# Patient Record
Sex: Female | Born: 1990 | Race: Black or African American | Hispanic: No | Marital: Single | Smoking: Former smoker
Health system: Southern US, Community
[De-identification: ages and names within clinical notes are randomized; demographics above are authoritative.]

## PROBLEM LIST (undated history)

## (undated) ENCOUNTER — Inpatient Hospital Stay (HOSPITAL_COMMUNITY): Payer: Self-pay

## (undated) DIAGNOSIS — D573 Sickle-cell trait: Secondary | ICD-10-CM

## (undated) DIAGNOSIS — K439 Ventral hernia without obstruction or gangrene: Secondary | ICD-10-CM

## (undated) DIAGNOSIS — I4891 Unspecified atrial fibrillation: Secondary | ICD-10-CM

## (undated) DIAGNOSIS — F1290 Cannabis use, unspecified, uncomplicated: Secondary | ICD-10-CM

## (undated) DIAGNOSIS — F1490 Cocaine use, unspecified, uncomplicated: Secondary | ICD-10-CM

## (undated) DIAGNOSIS — A749 Chlamydial infection, unspecified: Secondary | ICD-10-CM

## (undated) DIAGNOSIS — O24419 Gestational diabetes mellitus in pregnancy, unspecified control: Secondary | ICD-10-CM

## (undated) DIAGNOSIS — N390 Urinary tract infection, site not specified: Secondary | ICD-10-CM

## (undated) HISTORY — DX: Cocaine use, unspecified, uncomplicated: F14.90

## (undated) HISTORY — DX: Cannabis use, unspecified, uncomplicated: F12.90

## (undated) HISTORY — DX: Gestational diabetes mellitus in pregnancy, unspecified control: O24.419

## (undated) HISTORY — PX: WISDOM TOOTH EXTRACTION: SHX21

---
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
## 2011-02-11 LAB — DIFFERENTIAL
Eosinophils Absolute: 0.2 10*3/uL (ref 0.0–1.2)
Lymphs Abs: 1.3 10*3/uL (ref 1.1–4.8)
Monocytes Relative: 11 % (ref 3–11)
Neutrophils Relative %: 60 % (ref 43–71)

## 2011-02-11 LAB — APTT: aPTT: 28 seconds (ref 24–37)

## 2011-02-11 LAB — URINE MICROSCOPIC-ADD ON

## 2011-02-11 LAB — URINALYSIS, ROUTINE W REFLEX MICROSCOPIC
Ketones, ur: NEGATIVE mg/dL
Nitrite: NEGATIVE
Specific Gravity, Urine: 1.016 (ref 1.005–1.030)
pH: 6.5 (ref 5.0–8.0)

## 2011-02-11 LAB — CBC
HCT: 38.7 % (ref 36.0–49.0)
Hemoglobin: 13.2 g/dL (ref 12.0–16.0)
MCH: 28.1 pg (ref 25.0–34.0)
RBC: 4.7 MIL/uL (ref 3.80–5.70)

## 2011-02-11 LAB — PROTIME-INR: INR: 1.12 (ref 0.00–1.49)

## 2011-02-11 LAB — GC/CHLAMYDIA PROBE AMP, URINE: Chlamydia, Swab/Urine, PCR: POSITIVE — AB

## 2011-03-05 LAB — RAPID STREP SCREEN (MED CTR MEBANE ONLY): Streptococcus, Group A Screen (Direct): NEGATIVE

## 2011-03-08 LAB — RAPID STREP SCREEN (MED CTR MEBANE ONLY): Streptococcus, Group A Screen (Direct): NEGATIVE

## 2011-03-09 LAB — BASIC METABOLIC PANEL
CO2: 27 mEq/L (ref 19–32)
Chloride: 105 mEq/L (ref 96–112)
Sodium: 136 mEq/L (ref 135–145)

## 2011-03-09 LAB — URINALYSIS, ROUTINE W REFLEX MICROSCOPIC
Bilirubin Urine: NEGATIVE
Glucose, UA: NEGATIVE mg/dL
Hgb urine dipstick: NEGATIVE
Ketones, ur: NEGATIVE mg/dL
Nitrite: POSITIVE — AB
Protein, ur: NEGATIVE mg/dL
Specific Gravity, Urine: 1.013 (ref 1.005–1.030)
Urobilinogen, UA: 1 mg/dL (ref 0.0–1.0)
pH: 7.5 (ref 5.0–8.0)

## 2011-03-09 LAB — CBC
Hemoglobin: 12.8 g/dL (ref 12.0–16.0)
MCHC: 33.8 g/dL (ref 31.0–37.0)
MCV: 85.8 fL (ref 78.0–98.0)
RBC: 4.42 MIL/uL (ref 3.80–5.70)

## 2011-03-09 LAB — DIFFERENTIAL
Basophils Relative: 1 % (ref 0–1)
Eosinophils Absolute: 0.1 10*3/uL (ref 0.0–1.2)
Monocytes Absolute: 0.4 10*3/uL (ref 0.2–1.2)
Monocytes Relative: 9 % (ref 3–11)
Neutro Abs: 2.8 10*3/uL (ref 1.7–8.0)

## 2011-05-02 ENCOUNTER — Emergency Department (HOSPITAL_COMMUNITY)
Admission: EM | Admit: 2011-05-02 | Discharge: 2011-05-03 | Discharge status: Against Medical Advice | Payer: Medicaid Other | Attending: Emergency Medicine | Admitting: Emergency Medicine

## 2011-05-02 DIAGNOSIS — R079 Chest pain, unspecified: Secondary | ICD-10-CM | POA: Insufficient documentation

## 2011-08-23 LAB — URINE MICROSCOPIC-ADD ON

## 2011-08-23 LAB — POCT I-STAT, CHEM 8
Calcium, Ion: 1.3
Glucose, Bld: 99
HCT: 42
Hemoglobin: 14.3

## 2011-08-23 LAB — URINALYSIS, ROUTINE W REFLEX MICROSCOPIC
Glucose, UA: NEGATIVE
pH: 6

## 2011-08-23 LAB — CBC
HCT: 37.3
Hemoglobin: 12.5
MCHC: 32.9
MCV: 85.9
Platelets: 225
RDW: 13.4
RDW: 13.4

## 2011-08-23 LAB — URINE CULTURE

## 2011-12-02 ENCOUNTER — Emergency Department (HOSPITAL_COMMUNITY)
Admission: EM | Admit: 2011-12-02 | Discharge: 2011-12-02 | Discharge status: Against Medical Advice | Payer: Medicaid Other | Attending: Emergency Medicine | Admitting: Emergency Medicine

## 2011-12-02 DIAGNOSIS — Z0389 Encounter for observation for other suspected diseases and conditions ruled out: Secondary | ICD-10-CM | POA: Insufficient documentation

## 2011-12-02 HISTORY — DX: Unspecified atrial fibrillation: I48.91

## 2011-12-02 NOTE — ED Notes (Signed)
Pt presents with c/o racing heart x 1 day.  +shortness of breath and weakness.  Pt seen at PCP in [place] and sent here for admission.

## 2012-05-30 ENCOUNTER — Encounter (HOSPITAL_COMMUNITY): Payer: Self-pay | Admitting: *Deleted

## 2012-05-30 ENCOUNTER — Emergency Department (HOSPITAL_COMMUNITY)
Admission: EM | Admit: 2012-05-30 | Discharge: 2012-05-31 | Disposition: A | Discharge status: Home / Self Care | Payer: Medicaid Other | Attending: Emergency Medicine | Admitting: Emergency Medicine

## 2012-05-30 DIAGNOSIS — R071 Chest pain on breathing: Secondary | ICD-10-CM | POA: Insufficient documentation

## 2012-05-30 DIAGNOSIS — F17200 Nicotine dependence, unspecified, uncomplicated: Secondary | ICD-10-CM | POA: Insufficient documentation

## 2012-05-30 DIAGNOSIS — I4891 Unspecified atrial fibrillation: Secondary | ICD-10-CM | POA: Insufficient documentation

## 2012-05-30 DIAGNOSIS — R0789 Other chest pain: Secondary | ICD-10-CM

## 2012-05-30 LAB — CBC WITH DIFFERENTIAL/PLATELET
Basophils Relative: 1 % (ref 0–1)
Hemoglobin: 12.5 g/dL (ref 12.0–15.0)
MCHC: 34.3 g/dL (ref 30.0–36.0)
Monocytes Relative: 10 % (ref 3–12)
Neutro Abs: 4.2 10*3/uL (ref 1.7–7.7)
Neutrophils Relative %: 62 % (ref 43–77)
Platelets: 216 10*3/uL (ref 150–400)
RBC: 4.31 MIL/uL (ref 3.87–5.11)

## 2012-05-30 LAB — POCT I-STAT, CHEM 8
Chloride: 105 mEq/L (ref 96–112)
Glucose, Bld: 94 mg/dL (ref 70–99)
HCT: 39 % (ref 36.0–46.0)
Potassium: 3.7 mEq/L (ref 3.5–5.1)
Sodium: 142 mEq/L (ref 135–145)

## 2012-05-30 NOTE — ED Notes (Addendum)
C/o chest pain, describes as intermittant, pounding & burning, onset 2 weeks ago, also woke this morning with head swelling states "it (head) hurts". When asked also admits to sob, weakness and dizziness. Alert, NAD, calm interactive, skin W&D, resps e/u, speaking in clear complete sentences. "Chest and head rated 8/10, but head more bothersome". Mentions new prescription eye glasses.

## 2012-05-31 ENCOUNTER — Emergency Department (HOSPITAL_COMMUNITY): Payer: Medicaid Other

## 2012-05-31 MED ORDER — IBUPROFEN 200 MG PO TABS
400.0000 mg | ORAL_TABLET | Freq: Once | ORAL | Status: AC
  Administered 2012-05-31: 400 mg via ORAL
  Filled 2012-05-31: qty 2

## 2012-05-31 NOTE — ED Notes (Signed)
Pt ambulated with a steady gait;VSS; A&Ox3; no signs of distress; respirations even and unlabored; skin warm and dry; no questions at this time.  

## 2012-05-31 NOTE — ED Notes (Signed)
Patient transported to X-ray 

## 2012-05-31 NOTE — ED Provider Notes (Addendum)
History     CSN: [id number]  Arrival date & time 05/30/12  2044   First MD Initiated Contact with Patient 05/31/12 0019      Chief Complaint  Patient presents with  . Chest Pain  . Headache    (Consider location/radiation/quality/duration/timing/severity/associated sxs/prior treatment) HPI Complains of chest pain at substernal area nonradiating, pleuritic in nature onset 2 weeks ago lasting anywhere from one to 10 minutes at a time. No associated shortness of breath nausea or fever no cough. Also complains of frontal headache onset 2 days ago since she fell out of bed and banged her fore head on a coffee table no other complaint. No treatment prior to coming here. No associated symptoms. No generalized weakness Past Medical History  Diagnosis Date  . Atrial fibrillation     History reviewed. No pertinent past surgical history.  No family history on file.  History  Substance Use Topics  . Smoking status: Current Everyday Smoker  . Smokeless tobacco: Not on file  . Alcohol Use: Yes    OB History    Grav Para Term Preterm Abortions TAB SAB Ect Mult Living                  Review of Systems  Constitutional: Negative.   Respiratory: Negative.   Cardiovascular: Positive for chest pain.  Gastrointestinal: Negative.   Musculoskeletal: Negative.   Skin: Negative.   Neurological: Positive for headaches.  Hematological: Negative.   Psychiatric/Behavioral: Negative.   All other systems reviewed and are negative.    Allergies  Review of patient's allergies indicates no known allergies.  Home Medications  No current outpatient prescriptions on file.  BP 108/67  Pulse 59  Temp 98.4 F (36.9 C) (Oral)  Resp 20  SpO2 99%  LMP 05/23/2012  Physical Exam  Nursing note and vitals reviewed. Constitutional: She is oriented to person, place, and time. She appears well-developed and well-nourished.  HENT:  Head: Normocephalic and atraumatic.  Right Ear: External ear  normal.  Left Ear: External ear normal.  Eyes: Conjunctivae are normal. Pupils are equal, round, and reactive to light.  Neck: Neck supple. No tracheal deviation present. No thyromegaly present.  Cardiovascular: Normal rate and regular rhythm.   No murmur heard. Pulmonary/Chest: Effort normal and breath sounds normal.       Chest wall is tender over sternum reproducing pain exactly. Pain is also reproduced by forcible abduction of either shoulder  Abdominal: Soft. Bowel sounds are normal. She exhibits no distension. There is no tenderness.       Obese  Musculoskeletal: Normal range of motion. She exhibits no edema and no tenderness.  Neurological: She is alert and oriented to person, place, and time. No cranial nerve deficit. Coordination normal.       Her strength 5 over 5 overall  Skin: Skin is warm and dry. No rash noted.  Psychiatric: She has a normal mood and affect.    ED Course  Procedures (including critical care time) Labs Reviewed  CBC WITH DIFFERENTIAL  POCT I-STAT, CHEM 8   Date: 05/31/2012  Rate: 70  Rhythm: normal sinus rhythm  QRS Axis: normal  Intervals: normal  ST/T Wave abnormalities: normal  Conduction Disutrbances:none  Narrative Interpretation:   Old EKG Reviewed: Tracing from 04/07/2009 shows sinus bradycardia 55 beats per minute otherwise unchanged, interpreted by me  No results found. 2 AM patient is asymptomatic after treatment with ibuprofen  No diagnosis found.  Results for orders placed during the hospital encounter  of 05/30/12  CBC WITH DIFFERENTIAL      Component Value Range   WBC 6.8  4.0 - 10.5 K/uL   RBC 4.31  3.87 - 5.11 MIL/uL   Hemoglobin 12.5  12.0 - 15.0 g/dL   HCT 16.1  09.6 - 04.5 %   MCV 84.5  78.0 - 100.0 fL   MCH 29.0  26.0 - 34.0 pg   MCHC 34.3  30.0 - 36.0 g/dL   RDW 40.9  81.1 - 91.4 %   Platelets 216  150 - 400 K/uL   Neutrophils Relative 62  43 - 77 %   Neutro Abs 4.2  1.7 - 7.7 K/uL   Lymphocytes Relative 25  12 - 46  %   Lymphs Abs 1.7  0.7 - 4.0 K/uL   Monocytes Relative 10  3 - 12 %   Monocytes Absolute 0.7  0.1 - 1.0 K/uL   Eosinophils Relative 3  0 - 5 %   Eosinophils Absolute 0.2  0.0 - 0.7 K/uL   Basophils Relative 1  0 - 1 %   Basophils Absolute 0.1  0.0 - 0.1 K/uL  POCT I-STAT, CHEM 8      Component Value Range   Sodium 142  135 - 145 mEq/L   Potassium 3.7  3.5 - 5.1 mEq/L   Chloride 105  96 - 112 mEq/L   BUN 8  6 - 23 mg/dL   Creatinine, Ser 7.82  0.50 - 1.10 mg/dL   Glucose, Bld 94  70 - 99 mg/dL   Calcium, Ion 9.56  2.13 - 1.32 mmol/L   TCO2 25  0 - 100 mmol/L   Hemoglobin 13.3  12.0 - 15.0 g/dL   HCT 08.6  57.8 - 46.9 %   Dg Chest 2 View  05/31/2012  *RADIOLOGY REPORT*  Clinical Data: Chest pain.  Headache.  CHEST - 2 VIEW  Comparison: 10/29/2010  Findings: Heart and mediastinal contours are within normal limits. No focal opacities or effusions.  No acute bony abnormality.  IMPRESSION: No active cardiopulmonary disease.  Original Report Authenticated By: Cyndie Chime, M.D.    Chest x-ray reviewed by me MDM  Note patient denies atrial fibrillation history or any history of irregular heartbeat Chest Pain is felt to be musculoskeletal in etiology, headache nonspecific Smoking cessation encouraged spent 3 minutes counseling on smoking Referral resource guide Diagnosis #1 minor head trauma with post concussive headache #2 chest wall pain #3 tobacco abuse     Doug Sou, MD 05/31/12 0205  Doug Sou, MD 05/31/12 [id number]

## 2012-05-31 NOTE — ED Notes (Signed)
Pt reports chest pain worse upon lying down. No signs of distress; reports she was sleeping last night and rolled on to table; area on head is not swollen or bruised.

## 2014-08-30 ENCOUNTER — Encounter (HOSPITAL_COMMUNITY): Payer: Self-pay | Admitting: Emergency Medicine

## 2014-08-30 ENCOUNTER — Emergency Department (HOSPITAL_COMMUNITY)
Admission: EM | Admit: 2014-08-30 | Discharge: 2014-08-30 | Disposition: A | Discharge status: Home / Self Care | Payer: Medicaid Other | Attending: Emergency Medicine | Admitting: Emergency Medicine

## 2014-08-30 ENCOUNTER — Emergency Department (HOSPITAL_COMMUNITY): Payer: Medicaid Other

## 2014-08-30 DIAGNOSIS — Z8679 Personal history of other diseases of the circulatory system: Secondary | ICD-10-CM | POA: Insufficient documentation

## 2014-08-30 DIAGNOSIS — Z720 Tobacco use: Secondary | ICD-10-CM | POA: Insufficient documentation

## 2014-08-30 DIAGNOSIS — M25512 Pain in left shoulder: Secondary | ICD-10-CM

## 2014-08-30 MED ORDER — OXYCODONE-ACETAMINOPHEN 5-325 MG PO TABS
1.0000 | ORAL_TABLET | Freq: Once | ORAL | Status: AC
  Administered 2014-08-30: 1 via ORAL
  Filled 2014-08-30: qty 1

## 2014-08-30 MED ORDER — NAPROXEN 500 MG PO TABS: 500.0000 mg | ORAL_TABLET | Freq: Two times a day (BID) | ORAL | Status: DC

## 2014-08-30 NOTE — ED Notes (Signed)
Pt reports left shoulder pain x 2 days. Denies injury. Pain worse with palpation and movement. NAD.

## 2014-08-30 NOTE — Discharge Instructions (Signed)

## 2014-08-30 NOTE — ED Provider Notes (Signed)
CSN: [id number]     Arrival date & time 08/30/14  1654 History  This chart was scribed for non-physician practitioner, Etta Quill, FNP,working with Babette Relic, MD, by Marlowe Kays, ED Scribe. This patient was seen in room TR05C/TR05C and the patient's care was started at 5:47 PM.  Chief Complaint  Patient presents with  . Shoulder Pain   Patient is a 24 y.o. female presenting with shoulder pain. The history is provided by the patient. No language interpreter was used.  Shoulder Pain This is a new problem. The current episode started 2 days ago. The problem occurs constantly. The problem has not changed since onset.The symptoms are aggravated by exertion. Nothing relieves the symptoms. She has tried nothing for the symptoms.   HPI Comments:  Nichole Bowers is a 23 y.o. female with PMH of atrial fibrillation who presents to the Emergency Department complaining of sudden onset, worsening left shoulder pain and swelling onset two days upon waking. Pt states the pain is worse with movement and touch. She denies any alleviating factors. She denies any trauma, fall or injury. Denies numbness, tingling, weakness, redness or warmth of the area. She does not have a PCP.  Past Medical History  Diagnosis Date  . Atrial fibrillation    No past surgical history on file. No family history on file. History  Substance Use Topics  . Smoking status: Current Some Day Smoker    Types: Cigarettes  . Smokeless tobacco: Not on file  . Alcohol Use: Yes   OB History   Grav Para Term Preterm Abortions TAB SAB Ect Mult Living                 Review of Systems  Musculoskeletal: Positive for myalgias.  Skin: Negative for color change.  Neurological: Negative for weakness and numbness.  All other systems reviewed and are negative.   Allergies  Review of patient's allergies indicates no known allergies.  Home Medications   Prior to Admission medications   Not on File   Triage Vitals: BP  133/104  Pulse 78  Temp(Src) 97.8 F (36.6 C) (Oral)  Resp 18  SpO2 98%  LMP 07/31/2014 Physical Exam  Nursing note and vitals reviewed. Constitutional: She is oriented to person, place, and time. She appears well-developed and well-nourished.  HENT:  Head: Normocephalic and atraumatic.  Eyes: EOM are normal.  Neck: Normal range of motion.  Cardiovascular: Normal rate.   Radial pulses intact.  Pulmonary/Chest: Effort normal.  Musculoskeletal: Normal range of motion. She exhibits tenderness.  Tenderness diffusely over joint capsule. Increased pain of passive and active ROM.  Neurological: She is alert and oriented to person, place, and time.  Good grip strength. Distal sensations intact.  Skin: Skin is warm and dry.  Psychiatric: She has a normal mood and affect. Her behavior is normal.    ED Course  Procedures (including critical care time) DIAGNOSTIC STUDIES: Oxygen Saturation is 98% on RA, normal by my interpretation.   COORDINATION OF CARE: 5:51 PM- Will X-Ray left shoulder. Pt verbalizes understanding and agrees to plan.  Medications - No data to display  Labs Review Labs Reviewed - No data to display  Imaging Review Dg Shoulder Left  08/30/2014   CLINICAL DATA:  Acute left shoulder pain. Swelling. No known injury.  EXAM: LEFT SHOULDER - 2+ VIEW  COMPARISON:  04/10/2010.  FINDINGS: No acute fracture or dislocation.  IMPRESSION: No findings to explain the patient's pain.   Electronically Signed   By: Rip Harbour  Blietz M.D.   On: 08/30/2014 20:01     EKG Interpretation None     Left shoulder pain.  Denies chest pain, shortness of breath.  Increased pain with palpation and ROM.  Suspect musculo-skeletal origin of pain. MDM   Final diagnoses:  None    Left shoulder pain. Will treat with NSAID, cryotherapy.  Follow-up with ortho/pcp if no improvement with treatment.  I personally performed the services described in this documentation, which was scribed in my  presence. The recorded information has been reviewed and is accurate.    Norman Herrlich, NP 08/30/14 [phone]

## 2014-08-31 NOTE — ED Provider Notes (Signed)
Medical screening examination/treatment/procedure(s) were performed by non-physician practitioner and as supervising physician I was immediately available for consultation/collaboration.   EKG Interpretation None       Babette Relic, MD 08/31/14 [phone]

## 2015-06-24 ENCOUNTER — Encounter (HOSPITAL_COMMUNITY): Payer: Self-pay | Admitting: Emergency Medicine

## 2015-06-24 ENCOUNTER — Emergency Department (HOSPITAL_COMMUNITY): Payer: Self-pay

## 2015-06-24 ENCOUNTER — Emergency Department (HOSPITAL_COMMUNITY)
Admission: EM | Admit: 2015-06-24 | Discharge: 2015-06-24 | Disposition: A | Discharge status: Home / Self Care | Payer: Self-pay | Attending: Emergency Medicine | Admitting: Emergency Medicine

## 2015-06-24 DIAGNOSIS — Y9339 Activity, other involving climbing, rappelling and jumping off: Secondary | ICD-10-CM | POA: Insufficient documentation

## 2015-06-24 DIAGNOSIS — Z720 Tobacco use: Secondary | ICD-10-CM | POA: Insufficient documentation

## 2015-06-24 DIAGNOSIS — I4891 Unspecified atrial fibrillation: Secondary | ICD-10-CM | POA: Insufficient documentation

## 2015-06-24 DIAGNOSIS — Z791 Long term (current) use of non-steroidal anti-inflammatories (NSAID): Secondary | ICD-10-CM | POA: Insufficient documentation

## 2015-06-24 DIAGNOSIS — X58XXXA Exposure to other specified factors, initial encounter: Secondary | ICD-10-CM | POA: Insufficient documentation

## 2015-06-24 DIAGNOSIS — Y9289 Other specified places as the place of occurrence of the external cause: Secondary | ICD-10-CM | POA: Insufficient documentation

## 2015-06-24 DIAGNOSIS — S93402A Sprain of unspecified ligament of left ankle, initial encounter: Secondary | ICD-10-CM | POA: Insufficient documentation

## 2015-06-24 DIAGNOSIS — Y998 Other external cause status: Secondary | ICD-10-CM | POA: Insufficient documentation

## 2015-06-24 MED ORDER — NAPROXEN 500 MG PO TABS: 500.0000 mg | ORAL_TABLET | Freq: Two times a day (BID) | ORAL | Status: DC

## 2015-06-24 MED ORDER — NAPROXEN 250 MG PO TABS
500.0000 mg | ORAL_TABLET | Freq: Once | ORAL | Status: AC
  Administered 2015-06-24: 500 mg via ORAL
  Filled 2015-06-24: qty 2

## 2015-06-24 NOTE — ED Notes (Signed)
Jumped off a wall yesterday, twisting left ankle. C/o pain left lateral ankle. Mild swelling noted.

## 2015-06-24 NOTE — ED Provider Notes (Signed)
CSN: [id number]     Arrival date & time 06/24/15  0932 History   This chart was scribed for non-physician practitioner, Carlisle Cater, PA-C, working with Elnora Morrison, MD by Ladene Artist, ED Scribe. This patient was seen in room TR06C/TR06C and the patient's care was started at 10:34 AM.   Chief Complaint  Patient presents with  . Ankle Injury   The history is provided by the patient. No language interpreter was used.   HPI Comments: Nichole Bowers is a 24 y.o. female who presents to the Emergency Department complaining of left ankle injury sustained yesterday. Pt jumped off a wall yesterday and twisted her foot upon landing. She was able to self-ambulate following the incident. Pt reports gradual onset of constant left ankle pain with associated swelling. Pain is exacerbated with bearing weight and palpation. No treatments tried PTA. Pt denies numbness/tingling.   Past Medical History  Diagnosis Date  . Atrial fibrillation    History reviewed. No pertinent past surgical history. No family history on file. History  Substance Use Topics  . Smoking status: Current Some Day Smoker    Types: Cigarettes  . Smokeless tobacco: Not on file  . Alcohol Use: Yes   OB History    No data available     Review of Systems  Constitutional: Negative for activity change.  Musculoskeletal: Positive for joint swelling and arthralgias. Negative for back pain and neck pain.  Skin: Negative for wound.  Neurological: Negative for weakness and numbness.   Allergies  Review of patient's allergies indicates no known allergies.  Home Medications   Prior to Admission medications   Medication Sig Start Date End Date Taking? Authorizing Provider  naproxen (NAPROSYN) 500 MG tablet Take 1 tablet (500 mg total) by mouth 2 (two) times daily. 08/30/14   Etta Quill, NP   BP 111/63 mmHg  Pulse 72  Temp(Src) 98.3 F (36.8 C) (Oral)  Resp 16  SpO2 98%  LMP 06/24/2015  Physical Exam  Constitutional:  She appears well-developed and well-nourished. No distress.  HENT:  Head: Normocephalic and atraumatic.  Eyes: Conjunctivae and EOM are normal.  Neck: Normal range of motion. Neck supple. No tracheal deviation present.  Cardiovascular: Normal rate.   Pulses:      Dorsalis pedis pulses are 2+ on the right side, and 2+ on the left side.       Posterior tibial pulses are 2+ on the right side, and 2+ on the left side.  Pulmonary/Chest: Effort normal. No respiratory distress.  Musculoskeletal: Normal range of motion. She exhibits edema and tenderness.  Patient complains of pain with palpation of the medial/lateral left ankle. She denies pain with palpation over the fibular head of the affected side. She denies pain in the hip of the affected side.  Neurological: She is alert.  Distal motor, sensation, and vascular intact.   Skin: Skin is warm and dry.  Psychiatric: She has a normal mood and affect. Her behavior is normal.  Nursing note and vitals reviewed.  ED Course  Procedures (including critical care time) DIAGNOSTIC STUDIES: Oxygen Saturation is 98% on RA, normal by my interpretation.    COORDINATION OF CARE: 10:37 AM-Discussed treatment plan which includes XR and ankle brace with pt at bedside and pt agreed to plan.   Labs Review Labs Reviewed - No data to display  Imaging Review Dg Ankle Complete Left  06/24/2015   CLINICAL DATA:  Status post trauma to the left ankle yesterday with pain laterally.  EXAM: LEFT  ANKLE COMPLETE - 3+ VIEW  COMPARISON:  None.  FINDINGS: There is no evidence of fracture, dislocation, or joint effusion. There is no evidence of arthropathy or other focal bone abnormality. Soft tissues are unremarkable.  IMPRESSION: Negative.   Electronically Signed   By: Abelardo Diesel M.D.   On: 06/24/2015 10:22    EKG Interpretation None       Vital signs reviewed and are as follows: Filed Vitals:   06/24/15 0939  BP: 111/63  Pulse: 72  Temp: 98.3 F (36.8 C)   Resp: 16   Patient was counseled on RICE protocol and told to rest injury, use ice for no longer than 15 minutes every hour, compress the area, and elevate above the level of their heart as much as possible to reduce swelling. Questions answered. Patient verbalized understanding.    PCP f/u in 1 week if not better.    MDM   Final diagnoses:  Ankle sprain, left, initial encounter   Patient with ankle injury. Negative x-ray. Suspect sprain. Rice protocol, NSAID therapy indicated with PCP follow-up if not improving. Lower extremity is neurovascularly intact without signs of compartment syndrome.  I personally performed the services described in this documentation, which was scribed in my presence. The recorded information has been reviewed and is accurate.   Carlisle Cater, PA-C 06/24/15 1048  Elnora Morrison, MD 06/24/15 [phone]

## 2015-06-24 NOTE — Discharge Instructions (Signed)
Please read and follow all provided instructions.  Your diagnoses today include:  1. Ankle sprain, left, initial encounter    Tests performed today include:  An x-ray of your ankle - does NOT show any broken bones  Vital signs. See below for your results today.   Medications prescribed:   Naproxen - anti-inflammatory pain medication  Do not exceed 500mg  naproxen every 12 hours, take with food  You have been prescribed an anti-inflammatory medication or NSAID. Take with food. Take smallest effective dose for the shortest duration needed for your pain. Stop taking if you experience stomach pain or vomiting.   Take any prescribed medications only as directed.  Home care instructions:   Follow any educational materials contained in this packet  Follow R.I.C.E. Protocol:  R - rest your injury   I  - use ice on injury without applying directly to skin  C - compress injury with bandage or splint  E - elevate the injury as much as possible  Follow-up instructions: Please follow-up with your primary care provider if you continue to have significant pain or trouble walking in 1 week. In this case you may have a severe sprain that requires further care.   Return instructions:   Please return if your toes are numb or tingling, appear gray or blue, or you have severe pain (also elevate leg and loosen splint or wrap)  Please return to the Emergency Department if you experience worsening symptoms.   Please return if you have any other emergent concerns.  Additional Information:  Your vital signs today were: BP 111/63 mmHg   Pulse 72   Temp(Src) 98.3 F (36.8 C) (Oral)   Resp 16   SpO2 98%   LMP 06/24/2015 If your blood pressure (BP) was elevated above 135/85 this visit, please have this repeated by your doctor within one month. -------------- Your caregiver has diagnosed you as suffering from an ankle sprain. Ankle sprain occurs when the ligaments that hold the ankle joint  together are stretched or torn. It may take 4 to 6 weeks to heal. --------------

## 2015-10-15 ENCOUNTER — Encounter (HOSPITAL_COMMUNITY): Payer: Self-pay | Admitting: *Deleted

## 2015-10-15 ENCOUNTER — Emergency Department (HOSPITAL_COMMUNITY)
Admission: EM | Admit: 2015-10-15 | Discharge: 2015-10-15 | Disposition: A | Discharge status: Home / Self Care | Payer: Self-pay | Attending: Emergency Medicine | Admitting: Emergency Medicine

## 2015-10-15 DIAGNOSIS — L02415 Cutaneous abscess of right lower limb: Secondary | ICD-10-CM | POA: Insufficient documentation

## 2015-10-15 DIAGNOSIS — F17210 Nicotine dependence, cigarettes, uncomplicated: Secondary | ICD-10-CM | POA: Insufficient documentation

## 2015-10-15 DIAGNOSIS — Z791 Long term (current) use of non-steroidal anti-inflammatories (NSAID): Secondary | ICD-10-CM | POA: Insufficient documentation

## 2015-10-15 DIAGNOSIS — Z8679 Personal history of other diseases of the circulatory system: Secondary | ICD-10-CM | POA: Insufficient documentation

## 2015-10-15 DIAGNOSIS — L0291 Cutaneous abscess, unspecified: Secondary | ICD-10-CM

## 2015-10-15 MED ORDER — LIDOCAINE HCL 2 % IJ SOLN
20.0000 mL | Freq: Once | INTRAMUSCULAR | Status: AC
  Administered 2015-10-15: 400 mg
  Filled 2015-10-15: qty 20

## 2015-10-15 NOTE — Discharge Instructions (Signed)
Incision and Drainage Incision and drainage is a procedure in which a sac-like structure (cystic structure) is opened and drained. The area to be drained usually contains material such as pus, fluid, or blood.  LET YOUR CAREGIVER KNOW ABOUT:   Allergies to medicine.  Medicines taken, including vitamins, herbs, eyedrops, over-the-counter medicines, and creams.  Use of steroids (by mouth or creams).  Previous problems with anesthetics or numbing medicines.  History of bleeding problems or blood clots.  Previous surgery.  Other health problems, including diabetes and kidney problems.  Possibility of pregnancy, if this applies. RISKS AND COMPLICATIONS  Pain.  Bleeding.  Scarring.  Infection. BEFORE THE PROCEDURE  You may need to have an ultrasound or other imaging tests to see how large or deep your cystic structure is. Blood tests may also be used to determine if you have an infection or how severe the infection is. You may need to have a tetanus shot. PROCEDURE  The affected area is cleaned with a cleaning fluid. The cyst area will then be numbed with a medicine (local anesthetic). A small incision will be made in the cystic structure. A syringe or catheter may be used to drain the contents of the cystic structure, or the contents may be squeezed out. The area will then be flushed with a cleansing solution. After cleansing the area, it is often gently packed with a gauze or another wound dressing. Once it is packed, it will be covered with gauze and tape or some other type of wound dressing. AFTER THE PROCEDURE   Often, you will be allowed to go home right after the procedure.  You may be given antibiotic medicine to prevent or heal an infection.  If the area was packed with gauze or some other wound dressing, you will likely need to come back in 1 to 2 days to get it removed.  The area should heal in about 14 days.   This information is not intended to replace advice given  to you by your health care provider. Make sure you discuss any questions you have with your health care provider.   Document Released: 05/11/2001 Document Revised: 05/16/2012 Document Reviewed: 01/10/2012 Elsevier Interactive Patient Education [employer].   Please read attached information. If you experience any new or worsening signs or symptoms please return to the emergency room for evaluation. Please follow-up with your primary care provider or specialist as discussed. Please use medication prescribed only as directed and discontinue taking if you have any concerning signs or symptoms.

## 2015-10-15 NOTE — ED Provider Notes (Signed)
CSN: [id number]     Arrival date & time 10/15/15  0732 History   First MD Initiated Contact with Patient 10/15/15 0757     Chief Complaint  Patient presents with  . Skin Problem   HPI   24 year old female presents today with an abscess to her right inner thigh. Patient reports it has been present for the last 2 days, with progressive worsening. She reports no history of the same, no other lesions noted. Patient has been trying warm compresses without resolution. She denies any redness, warmth to touch, or any other signs of cellulitis. Patient denies a history of diabetes, MRSA, or any chronic health conditions. No other complaints.   Past Medical History  Diagnosis Date  . Atrial fibrillation ([place])    History reviewed. No pertinent past surgical history. History reviewed. No pertinent family history. Social History  Substance Use Topics  . Smoking status: Current Some Day Smoker    Types: Cigarettes  . Smokeless tobacco: None  . Alcohol Use: Yes   OB History    No data available     Review of Systems  All other systems reviewed and are negative.   Allergies  Review of patient's allergies indicates no known allergies.  Home Medications   Prior to Admission medications   Medication Sig Start Date End Date Taking? Authorizing Provider  naproxen (NAPROSYN) 500 MG tablet Take 1 tablet (500 mg total) by mouth 2 (two) times daily. 06/24/15   Carlisle Cater, PA-C   BP 131/74 mmHg  Pulse 77  Temp(Src) 97.8 F (36.6 C) (Oral)  Resp 16  SpO2 100%  LMP 09/14/2015   Physical Exam  Constitutional: She is oriented to person, place, and time. She appears well-developed and well-nourished.  HENT:  Head: Normocephalic and atraumatic.  Eyes: Conjunctivae are normal. Pupils are equal, round, and reactive to light. Right eye exhibits no discharge. Left eye exhibits no discharge. No scleral icterus.  Neck: Normal range of motion. No JVD present. No tracheal deviation present.   Pulmonary/Chest: Effort normal. No stridor.  Neurological: She is alert and oriented to person, place, and time. Coordination normal.  Skin:  1 cm area of fluctuance with surrounding induration to the right proximal inner thigh, no warmth to touch, no surrounding redness or edema; no cellulitis  Psychiatric: She has a normal mood and affect. Her behavior is normal. Judgment and thought content normal.  Nursing note and vitals reviewed.   ED Course  Procedures (including critical care time)  INCISION AND DRAINAGE Performed by: Elmer Ramp Consent: Verbal consent obtained. Risks and benefits: risks, benefits and alternatives were discussed Type: abscess  Body area: Right proximal inner thigh  Anesthesia: local infiltration  Incision was made with a scalpel.  Local anesthetic: lidocaine 2 %  Anesthetic total: 3 ml  Complexity: complex Blunt dissection to break up loculations  Drainage: purulent  Drainage amount: 14ml  Packing material: None   Patient tolerance: Patient tolerated the procedure well with no immediate complications.    Labs Review Labs Reviewed - No data to display  Imaging Review No results found. I have personally reviewed and evaluated these images and lab results as part of my medical decision-making.   EKG Interpretation None      MDM   Final diagnoses:  Abscess    Labs:   Imaging:  Consults:  Therapeutics: Lidocaine  Discharge Meds:   Assessment/Plan: Patient presents with an abscess to her right inner thigh, no signs of cellulitis. I&D performed here in  the ED with no complication. Patient given wound care traction's, instructed to follow-up in 2-3 days for reevaluation. She is instructed to return immediately if any new or worsening signs or symptoms present. Patient verbalized understanding and agreement for today's plan and had no further questions or concerns at time of discharge.         Okey Regal,  PA-C 10/15/15 [id number]  Blanchie Dessert, MD 10/17/15 [phone]

## 2015-10-15 NOTE — ED Notes (Signed)
Pt reports pain at abscess site on RT upper inner thigh. Pt reports she has applied warm compresses to site.pain reported to be 9/10

## 2015-10-15 NOTE — ED Notes (Signed)
Hedges, PA at bedside for procedure.

## 2015-11-03 ENCOUNTER — Encounter: Payer: Self-pay | Admitting: *Deleted

## 2015-11-03 ENCOUNTER — Ambulatory Visit (INDEPENDENT_AMBULATORY_CARE_PROVIDER_SITE_OTHER): Payer: Self-pay | Admitting: *Deleted

## 2015-11-03 DIAGNOSIS — O2341 Unspecified infection of urinary tract in pregnancy, first trimester: Secondary | ICD-10-CM

## 2015-11-03 DIAGNOSIS — Z3201 Encounter for pregnancy test, result positive: Secondary | ICD-10-CM

## 2015-11-04 ENCOUNTER — Encounter: Payer: Self-pay | Admitting: Obstetrics & Gynecology

## 2015-11-04 DIAGNOSIS — Z6791 Unspecified blood type, Rh negative: Secondary | ICD-10-CM | POA: Insufficient documentation

## 2015-11-04 DIAGNOSIS — O26899 Other specified pregnancy related conditions, unspecified trimester: Secondary | ICD-10-CM

## 2015-11-04 LAB — PRENATAL PROFILE (SOLSTAS)
Antibody Screen: NEGATIVE
BASOS ABS: 0 10*3/uL (ref 0.0–0.1)
Basophils Relative: 0 % (ref 0–1)
Eosinophils Absolute: 0.1 10*3/uL (ref 0.0–0.7)
Eosinophils Relative: 2 % (ref 0–5)
HEMATOCRIT: 39.2 % (ref 36.0–46.0)
HEP B S AG: NEGATIVE
HIV: NONREACTIVE
Hemoglobin: 13 g/dL (ref 12.0–15.0)
LYMPHS PCT: 28 % (ref 12–46)
Lymphs Abs: 1.3 10*3/uL (ref 0.7–4.0)
MCH: 28.8 pg (ref 26.0–34.0)
MCHC: 33.2 g/dL (ref 30.0–36.0)
MCV: 86.7 fL (ref 78.0–100.0)
MONO ABS: 0.5 10*3/uL (ref 0.1–1.0)
MPV: 10.7 fL (ref 8.6–12.4)
Monocytes Relative: 11 % (ref 3–12)
NEUTROS ABS: 2.7 10*3/uL (ref 1.7–7.7)
Neutrophils Relative %: 59 % (ref 43–77)
Platelets: 260 10*3/uL (ref 150–400)
RBC: 4.52 MIL/uL (ref 3.87–5.11)
RDW: 13.2 % (ref 11.5–15.5)
Rh Type: NEGATIVE
Rubella: 1.66 Index — ABNORMAL HIGH (ref <0.90)
WBC: 4.6 10*3/uL (ref 4.0–10.5)

## 2015-11-05 LAB — CULTURE, OB URINE: Colony Count: >=100000

## 2015-11-06 ENCOUNTER — Telehealth: Payer: Self-pay | Admitting: General Practice

## 2015-11-06 ENCOUNTER — Encounter: Payer: Self-pay | Admitting: Obstetrics & Gynecology

## 2015-11-06 DIAGNOSIS — O2340 Unspecified infection of urinary tract in pregnancy, unspecified trimester: Secondary | ICD-10-CM | POA: Insufficient documentation

## 2015-11-06 MED ORDER — AMOXICILLIN 500 MG PO CAPS: 500.0000 mg | ORAL_CAPSULE | Freq: Three times a day (TID) | ORAL | Status: DC

## 2015-11-06 NOTE — Addendum Note (Signed)
Addended by: Verita Schneiders A on: 11/06/2015 12:14 PM   Modules accepted: Orders

## 2015-11-06 NOTE — Telephone Encounter (Signed)
Per Dr Harolyn Rutherford, patient has UTI and antibiotic has been sent to pharmacy. Called patient at home number and her mother answered stating she wasn't in but would tell her we called

## 2015-11-07 ENCOUNTER — Other Ambulatory Visit: Payer: Self-pay

## 2015-11-07 LAB — COCAINE METABOLITE (GC/LC/MS), URINE: BENZOYLECGONINE GC/MS CONF: 148 ng/mL — AB (ref <100)

## 2015-11-07 LAB — CANNABANOIDS (GC/LC/MS), URINE: THC-COOH (GC/LC/MS), ur confirm: 229 ng/mL — AB (ref <5)

## 2015-11-07 MED ORDER — PROMETHAZINE HCL 25 MG PO TABS: 25.0000 mg | ORAL_TABLET | Freq: Four times a day (QID) | ORAL | Status: DC | PRN

## 2015-11-07 MED ORDER — PRENATAL VITAMINS 0.8 MG PO TABS: 1.0000 | ORAL_TABLET | Freq: Every day | ORAL | Status: DC

## 2015-11-07 NOTE — Telephone Encounter (Signed)
Called patient, no answer- left message stating we are trying to reach you to let you know that your most recent urine culture does show you have a UTI and we have sent in an antibiotic to your pharmacy. If you have questions you may call us back at the clinics

## 2015-11-07 NOTE — Telephone Encounter (Signed)
Spoke with patient concerning results. Pt is also requesting prenatal vitamin and something for nausea. Per Dr.Dove can call in Diclegis and prenatal vitamin. This will be sent into [facility]

## 2015-11-08 ENCOUNTER — Encounter: Payer: Self-pay | Admitting: Obstetrics & Gynecology

## 2015-11-08 DIAGNOSIS — O99320 Drug use complicating pregnancy, unspecified trimester: Secondary | ICD-10-CM

## 2015-11-08 DIAGNOSIS — F1290 Cannabis use, unspecified, uncomplicated: Secondary | ICD-10-CM | POA: Insufficient documentation

## 2015-11-08 DIAGNOSIS — F1410 Cocaine abuse, uncomplicated: Secondary | ICD-10-CM | POA: Insufficient documentation

## 2015-11-08 LAB — PRESCRIPTION MONITORING PROFILE (19 PANEL)
AMPHETAMINE/METH: NEGATIVE ng/mL
BARBITURATE SCREEN, URINE: NEGATIVE ng/mL
BUPRENORPHINE, URINE: NEGATIVE ng/mL
Benzodiazepine Screen, Urine: NEGATIVE ng/mL
CREATININE, URINE: 72.54 mg/dL (ref >20.0)
Carisoprodol, Urine: NEGATIVE ng/mL
Fentanyl, Ur: NEGATIVE ng/mL
MDMA URINE: NEGATIVE ng/mL
METHADONE SCREEN, URINE: NEGATIVE ng/mL
Meperidine, Ur: NEGATIVE ng/mL
Methaqualone: NEGATIVE ng/mL
NITRITES URINE, INITIAL: NEGATIVE ug/mL
OPIATE SCREEN, URINE: NEGATIVE ng/mL
OXYCODONE SCRN UR: NEGATIVE ng/mL
PH URINE, INITIAL: 6.9 pH (ref 4.5–8.9)
Phencyclidine, Ur: NEGATIVE ng/mL
Propoxyphene: NEGATIVE ng/mL
TRAMADOL UR: NEGATIVE ng/mL
Tapentadol, urine: NEGATIVE ng/mL
Zolpidem, Urine: NEGATIVE ng/mL

## 2015-11-30 HISTORY — PX: HERNIA REPAIR: SHX51

## 2015-12-12 ENCOUNTER — Ambulatory Visit (HOSPITAL_COMMUNITY)
Admission: RE | Admit: 2015-12-12 | Discharge: 2015-12-12 | Disposition: A | Discharge status: Home / Self Care | Payer: Medicaid Other | Source: Ambulatory Visit | Attending: Maternal and Fetal Medicine | Admitting: Maternal and Fetal Medicine

## 2015-12-12 ENCOUNTER — Other Ambulatory Visit: Payer: Self-pay | Admitting: Obstetrics & Gynecology

## 2015-12-12 ENCOUNTER — Encounter (HOSPITAL_COMMUNITY): Payer: Self-pay

## 2015-12-12 ENCOUNTER — Ambulatory Visit (HOSPITAL_COMMUNITY)
Admission: RE | Admit: 2015-12-12 | Discharge: 2015-12-12 | Disposition: A | Discharge status: Home / Self Care | Payer: Medicaid Other | Source: Ambulatory Visit | Attending: Obstetrics & Gynecology | Admitting: Obstetrics & Gynecology

## 2015-12-12 DIAGNOSIS — O99321 Drug use complicating pregnancy, first trimester: Secondary | ICD-10-CM

## 2015-12-12 DIAGNOSIS — Z3A11 11 weeks gestation of pregnancy: Secondary | ICD-10-CM | POA: Insufficient documentation

## 2015-12-12 DIAGNOSIS — Z3689 Encounter for other specified antenatal screening: Secondary | ICD-10-CM

## 2015-12-12 DIAGNOSIS — Z369 Encounter for antenatal screening, unspecified: Secondary | ICD-10-CM

## 2015-12-12 DIAGNOSIS — O99211 Obesity complicating pregnancy, first trimester: Secondary | ICD-10-CM | POA: Diagnosis present

## 2015-12-12 DIAGNOSIS — Z36 Encounter for antenatal screening of mother: Secondary | ICD-10-CM | POA: Insufficient documentation

## 2015-12-12 DIAGNOSIS — Z3201 Encounter for pregnancy test, result positive: Secondary | ICD-10-CM

## 2015-12-16 ENCOUNTER — Ambulatory Visit (INDEPENDENT_AMBULATORY_CARE_PROVIDER_SITE_OTHER): Payer: Self-pay | Admitting: Advanced Practice Midwife

## 2015-12-16 ENCOUNTER — Other Ambulatory Visit (HOSPITAL_COMMUNITY)
Admission: RE | Admit: 2015-12-16 | Discharge: 2015-12-16 | Disposition: A | Discharge status: Home / Self Care | Payer: Medicaid Other | Source: Ambulatory Visit | Attending: Advanced Practice Midwife | Admitting: Advanced Practice Midwife

## 2015-12-16 ENCOUNTER — Encounter: Payer: Self-pay | Admitting: Advanced Practice Midwife

## 2015-12-16 VITALS — BP 117/69 | HR 91 | Wt 215.5 lb

## 2015-12-16 DIAGNOSIS — Z01419 Encounter for gynecological examination (general) (routine) without abnormal findings: Secondary | ICD-10-CM | POA: Diagnosis present

## 2015-12-16 DIAGNOSIS — Z124 Encounter for screening for malignant neoplasm of cervix: Secondary | ICD-10-CM

## 2015-12-16 DIAGNOSIS — Z113 Encounter for screening for infections with a predominantly sexual mode of transmission: Secondary | ICD-10-CM

## 2015-12-16 DIAGNOSIS — O09899 Supervision of other high risk pregnancies, unspecified trimester: Secondary | ICD-10-CM | POA: Insufficient documentation

## 2015-12-16 DIAGNOSIS — O99321 Drug use complicating pregnancy, first trimester: Secondary | ICD-10-CM

## 2015-12-16 DIAGNOSIS — O2341 Unspecified infection of urinary tract in pregnancy, first trimester: Secondary | ICD-10-CM

## 2015-12-16 DIAGNOSIS — O09891 Supervision of other high risk pregnancies, first trimester: Secondary | ICD-10-CM

## 2015-12-16 DIAGNOSIS — F1410 Cocaine abuse, uncomplicated: Secondary | ICD-10-CM

## 2015-12-16 DIAGNOSIS — K047 Periapical abscess without sinus: Secondary | ICD-10-CM

## 2015-12-16 LAB — POCT URINALYSIS DIP (DEVICE)
Glucose, UA: NEGATIVE mg/dL
HGB URINE DIPSTICK: NEGATIVE
KETONES UR: NEGATIVE mg/dL
Nitrite: NEGATIVE
PH: 6 (ref 5.0–8.0)
Protein, ur: NEGATIVE mg/dL
SPECIFIC GRAVITY, URINE: 1.02 (ref 1.005–1.030)
Urobilinogen, UA: 0.2 mg/dL (ref 0.0–1.0)

## 2015-12-16 MED ORDER — AMOXICILLIN 500 MG PO CAPS: 500.0000 mg | ORAL_CAPSULE | Freq: Three times a day (TID) | ORAL | Status: DC

## 2015-12-16 NOTE — Progress Notes (Signed)
Subjective:    Nichole Bowers is a G1P0 [redacted]w[redacted]d by 11 week [place] (uncertain LMP) being seen today for her first obstetrical visit.  Her obstetrical history is significant for nulliparity, drug use. Patient does intend to breast feed. Pregnancy history fully reviewed.  Patient reports infection and severe pain in left upper molar x 4 days. Denies fever. Couldn't be seen by dentist due to pregnancy. Also requesting referral to eye doctor for blurry vision. Ingoing problem. Hx A fib. Dx'd in ED. Never F/U'd up by cardiology. Has had more frequent episodes of palpitations over the past few months, but no severe episodes.   Filed Vitals:   12/16/15 1318  BP: 117/69  Pulse: 91  Weight: 215 lb 8 oz (97.75 kg)    HISTORY: OB History  Gravida Para Term Preterm AB SAB TAB Ectopic Multiple Living  1             # Outcome Date GA Lbr Len/2nd Weight Sex Delivery Anes PTL Lv  1 Current              Past Medical History  Diagnosis Date  . Atrial fibrillation ([place])   . Cocaine use     Positive drug screen on 11/03/15  . Marijuana use     Positive drug screen on 11/03/15   Past Surgical History  Procedure Laterality Date  . No past surgeries     History reviewed. No pertinent family history.   Exam   BP 117/69 mmHg  Pulse 91  Wt 215 lb 8 oz (97.75 kg)  LMP 09/15/2015 (Approximate) Uterine Size: size equals dates  Pelvic Exam:    Perineum: No Hemorrhoids, Normal Perineum   Vulva: normal   Vagina:  normal mucosa, normal discharge, no palpable nodules   pH: Not done   Cervix: Scant bleeding following Pap, no cervical motion tenderness and no lesions   Adnexa: normal adnexa and no mass, fullness, tenderness   Bony Pelvis: Adequate  System: Breast:  Declined   Skin: normal coloration and turgor, no rashes    Neurologic: negative   Extremities: normal strength, tone, and muscle mass   HEENT neck supple with midline trachea and thyroid without masses   Mouth/Teeth mucous membranes  moist, pharynx normal without lesions. Tenderness in gums around left upper molars.    Neck supple and no masses   Cardiovascular: regular rate and rhythm, no murmurs or gallops   Respiratory:  appears well, vitals normal, no respiratory distress, acyanotic, normal RR, chest clear, no wheezing, crepitations, rhonchi, normal symmetric air entry   Abdomen: soft, non-tender; bowel sounds normal; no masses,  no organomegaly   Urinary: urethral meatus normal      Assessment:    Pregnancy: G1P0 Patient Active Problem List   Diagnosis Date Noted  . Supervision of other high risk pregnancy, antepartum 12/16/2015  . Cocaine abuse complicating pregnancy [id number]  . Marijuana use 11/08/2015  . UTI in pregnancy 11/06/2015  . Rh negative state in antepartum period 11/04/2015    1. Supervision of other high risk pregnancy, antepartum, first trimester  - Cytology - PAP - GC/Chlamydia probe amp ([place])not at [facility]  2. UTI in pregnancy, first trimester  - Culture, OB Urine TOC  3. UTI in pregnancy, antepartum, first trimester   4. Dental infection  - amoxicillin (AMOXIL) 500 MG capsule; Take 1 capsule (500 mg total) by mouth 3 (three) times daily.  Dispense: 21 capsule; Refill: 2 - Dental letter given   5. Cocaine  abuse complicating pregnancy, first trimester  6. A. Fib  - Instructed to F/U w/ cardiology. [place] for cardiac emergencies.   Plan:     Initial labs drawn. Prenatal vitamins. Problem list reviewed and updated. Genetic Screening discussed First Screen: performed. Results pending. Marland Kitchen Ultrasound discussed; fetal survey: requested. Follow up in 4 weeks. Discussed pos UDS and she will be tested through pregnancy and at time of delivery. SW consult will be done at delivery. Urged to stop all drug use.  Also discussed that Cocaine use can exacerbate cardiac problems.   [name] 12/16/2015

## 2015-12-16 NOTE — Patient Instructions (Signed)
Second Trimester of Pregnancy The second trimester is from week 13 through week 28, months 4 through 6. The second trimester is often a time when you feel your best. Your body has also adjusted to being pregnant, and you begin to feel better physically. Usually, morning sickness has lessened or quit completely, you may have more energy, and you may have an increase in appetite. The second trimester is also a time when the fetus is growing rapidly. At the end of the sixth month, the fetus is about 9 inches long and weighs about 1 pounds. You will likely begin to feel the baby move (quickening) between 18 and 20 weeks of the pregnancy. BODY CHANGES Your body goes through many changes during pregnancy. The changes vary from woman to woman.   Your weight will continue to increase. You will notice your lower abdomen bulging out.  You may begin to get stretch marks on your hips, abdomen, and breasts.  You may develop headaches that can be relieved by medicines approved by your health care provider.  You may urinate more often because the fetus is pressing on your bladder.  You may develop or continue to have heartburn as a result of your pregnancy.  You may develop constipation because certain hormones are causing the muscles that push waste through your intestines to slow down.  You may develop hemorrhoids or swollen, bulging veins (varicose veins).  You may have back pain because of the weight gain and pregnancy hormones relaxing your joints between the bones in your pelvis and as a result of a shift in weight and the muscles that support your balance.  Your breasts will continue to grow and be tender.  Your gums may bleed and may be sensitive to brushing and flossing.  Dark spots or blotches (chloasma, mask of pregnancy) may develop on your face. This will likely fade after the baby is born.  A dark line from your belly button to the pubic area (linea nigra) may appear. This will likely  fade after the baby is born.  You may have changes in your hair. These can include thickening of your hair, rapid growth, and changes in texture. Some women also have hair loss during or after pregnancy, or hair that feels dry or thin. Your hair will most likely return to normal after your baby is born. WHAT TO EXPECT AT YOUR PRENATAL VISITS During a routine prenatal visit:  You will be weighed to make sure you and the fetus are growing normally.  Your blood pressure will be taken.  Your abdomen will be measured to track your baby's growth.  The fetal heartbeat will be listened to.  Any test results from the previous visit will be discussed. Your health care provider may ask you:  How you are feeling.  If you are feeling the baby move.  If you have had any abnormal symptoms, such as leaking fluid, bleeding, severe headaches, or abdominal cramping.  If you are using any tobacco products, including cigarettes, chewing tobacco, and electronic cigarettes.  If you have any questions. Other tests that may be performed during your second trimester include:  Blood tests that check for:  Low iron levels (anemia).  Gestational diabetes (between 24 and 28 weeks).  Rh antibodies.  Urine tests to check for infections, diabetes, or protein in the urine.  An ultrasound to confirm the proper growth and development of the baby.  An amniocentesis to check for possible genetic problems.  Fetal screens for spina bifida   and Down syndrome.  HIV (human immunodeficiency virus) testing. Routine prenatal testing includes screening for HIV, unless you choose not to have this test. HOME CARE INSTRUCTIONS   Avoid all smoking, herbs, alcohol, and unprescribed drugs. These chemicals affect the formation and growth of the baby.  Do not use any tobacco products, including cigarettes, chewing tobacco, and electronic cigarettes. If you need help quitting, ask your health care provider. You may receive  counseling support and other resources to help you quit.  Follow your health care provider's instructions regarding medicine use. There are medicines that are either safe or unsafe to take during pregnancy.  Exercise only as directed by your health care provider. Experiencing uterine cramps is a good sign to stop exercising.  Continue to eat regular, healthy meals.  Wear a good support bra for breast tenderness.  Do not use hot tubs, steam rooms, or saunas.  Wear your seat belt at all times when driving.  Avoid raw meat, uncooked cheese, cat litter boxes, and soil used by cats. These carry germs that can cause birth defects in the baby.  Take your prenatal vitamins.  Take 1500-2000 mg of calcium daily starting at the 20th week of pregnancy until you deliver your baby.  Try taking a stool softener (if your health care provider approves) if you develop constipation. Eat more high-fiber foods, such as fresh vegetables or fruit and whole grains. Drink plenty of fluids to keep your urine clear or pale yellow.  Take warm sitz baths to soothe any pain or discomfort caused by hemorrhoids. Use hemorrhoid cream if your health care provider approves.  If you develop varicose veins, wear support hose. Elevate your feet for 15 minutes, 3-4 times a day. Limit salt in your diet.  Avoid heavy lifting, wear low heel shoes, and practice good posture.  Rest with your legs elevated if you have leg cramps or low back pain.  Visit your dentist if you have not gone yet during your pregnancy. Use a soft toothbrush to brush your teeth and be gentle when you floss.  A sexual relationship may be continued unless your health care provider directs you otherwise.  Continue to go to all your prenatal visits as directed by your health care provider. SEEK MEDICAL CARE IF:   You have dizziness.  You have mild pelvic cramps, pelvic pressure, or nagging pain in the abdominal area.  You have persistent nausea,  vomiting, or diarrhea.  You have a bad smelling vaginal discharge.  You have pain with urination. SEEK IMMEDIATE MEDICAL CARE IF:   You have a fever.  You are leaking fluid from your vagina.  You have spotting or bleeding from your vagina.  You have severe abdominal cramping or pain.  You have rapid weight gain or loss.  You have shortness of breath with chest pain.  You notice sudden or extreme swelling of your face, hands, ankles, feet, or legs.  You have not felt your baby move in over an hour.  You have severe headaches that do not go away with medicine.  You have vision changes.   This information is not intended to replace advice given to you by your health care provider. Make sure you discuss any questions you have with your health care provider.   Document Released: 11/09/2001 Document Revised: 12/06/2014 Document Reviewed: 01/16/2013 [employer] Interactive Patient Education 2016 [employer]   Breastfeeding Deciding to breastfeed is one of the best choices you can make for you and your baby. A change   in hormones during pregnancy causes your breast tissue to grow and increases the number and size of your milk ducts. These hormones also allow proteins, sugars, and fats from your blood supply to make breast milk in your milk-producing glands. Hormones prevent breast milk from being released before your baby is born as well as prompt milk flow after birth. Once breastfeeding has begun, thoughts of your baby, as well as his or her sucking or crying, can stimulate the release of milk from your milk-producing glands.  BENEFITS OF BREASTFEEDING For Your Baby  Your first milk (colostrum) helps your baby's digestive system function better.  There are antibodies in your milk that help your baby fight off infections.  Your baby has a lower incidence of asthma, allergies, and sudden infant death syndrome.  The nutrients in breast milk are better for your baby than infant  formulas and are designed uniquely for your baby's needs.  Breast milk improves your baby's brain development.  Your baby is less likely to develop other conditions, such as childhood obesity, asthma, or type 2 diabetes mellitus. For You  Breastfeeding helps to create a very special bond between you and your baby.  Breastfeeding is convenient. Breast milk is always available at the correct temperature and costs nothing.  Breastfeeding helps to burn calories and helps you lose the weight gained during pregnancy.  Breastfeeding makes your uterus contract to its prepregnancy size faster and slows bleeding (lochia) after you give birth.   Breastfeeding helps to lower your risk of developing type 2 diabetes mellitus, osteoporosis, and breast or ovarian cancer later in life. SIGNS THAT YOUR BABY IS HUNGRY Early Signs of Hunger  Increased alertness or activity.  Stretching.  Movement of the head from side to side.  Movement of the head and opening of the mouth when the corner of the mouth or cheek is stroked (rooting).  Increased sucking sounds, smacking lips, cooing, sighing, or squeaking.  Hand-to-mouth movements.  Increased sucking of fingers or hands. Late Signs of Hunger  Fussing.  Intermittent crying. Extreme Signs of Hunger Signs of extreme hunger will require calming and consoling before your baby will be able to breastfeed successfully. Do not wait for the following signs of extreme hunger to occur before you initiate breastfeeding:  Restlessness.  A loud, strong cry.  Screaming. BREASTFEEDING BASICS Breastfeeding Initiation  Find a comfortable place to sit or lie down, with your neck and back well supported.  Place a pillow or rolled up blanket under your baby to bring him or her to the level of your breast (if you are seated). Nursing pillows are specially designed to help support your arms and your baby while you breastfeed.  Make sure that your baby's  abdomen is facing your abdomen.  Gently massage your breast. With your fingertips, massage from your chest wall toward your nipple in a circular motion. This encourages milk flow. You may need to continue this action during the feeding if your milk flows slowly.  Support your breast with 4 fingers underneath and your thumb above your nipple. Make sure your fingers are well away from your nipple and your baby's mouth.  Stroke your baby's lips gently with your finger or nipple.  When your baby's mouth is open wide enough, quickly bring your baby to your breast, placing your entire nipple and as much of the colored area around your nipple (areola) as possible into your baby's mouth.  More areola should be visible above your baby's upper lip than   below the lower lip.  Your baby's tongue should be between his or her lower gum and your breast.  Ensure that your baby's mouth is correctly positioned around your nipple (latched). Your baby's lips should create a seal on your breast and be turned out (everted).  It is common for your baby to suck about 2-3 minutes in order to start the flow of breast milk. Latching Teaching your baby how to latch on to your breast properly is very important. An improper latch can cause nipple pain and decreased milk supply for you and poor weight gain in your baby. Also, if your baby is not latched onto your nipple properly, he or she may swallow some air during feeding. This can make your baby fussy. Burping your baby when you switch breasts during the feeding can help to get rid of the air. However, teaching your baby to latch on properly is still the best way to prevent fussiness from swallowing air while breastfeeding. Signs that your baby has successfully latched on to your nipple:  Silent tugging or silent sucking, without causing you pain.  Swallowing heard between every 3-4 sucks.  Muscle movement above and in front of his or her ears while sucking. Signs  that your baby has not successfully latched on to nipple:  Sucking sounds or smacking sounds from your baby while breastfeeding.  Nipple pain. If you think your baby has not latched on correctly, slip your finger into the corner of your baby's mouth to break the suction and place it between your baby's gums. Attempt breastfeeding initiation again. Signs of Successful Breastfeeding Signs from your baby:  A gradual decrease in the number of sucks or complete cessation of sucking.  Falling asleep.  Relaxation of his or her body.  Retention of a small amount of milk in his or her mouth.  Letting go of your breast by himself or herself. Signs from you:  Breasts that have increased in firmness, weight, and size 1-3 hours after feeding.  Breasts that are softer immediately after breastfeeding.  Increased milk volume, as well as a change in milk consistency and color by the fifth day of breastfeeding.  Nipples that are not sore, cracked, or bleeding. Signs That Your Baby is Getting Enough Milk  Wetting at least 3 diapers in a 24-hour period. The urine should be clear and pale yellow by age 5 days.  At least 3 stools in a 24-hour period by age 5 days. The stool should be soft and yellow.  At least 3 stools in a 24-hour period by age 7 days. The stool should be seedy and yellow.  No loss of weight greater than 10% of birth weight during the first 3 days of age.  Average weight gain of 4-7 ounces (113-198 g) per week after age 4 days.  Consistent daily weight gain by age 5 days, without weight loss after the age of 2 weeks. After a feeding, your baby may spit up a small amount. This is common. BREASTFEEDING FREQUENCY AND DURATION Frequent feeding will help you make more milk and can prevent sore nipples and breast engorgement. Breastfeed when you feel the need to reduce the fullness of your breasts or when your baby shows signs of hunger. This is called "breastfeeding on demand." Avoid  introducing a pacifier to your baby while you are working to establish breastfeeding (the first 4-6 weeks after your baby is born). After this time you may choose to use a pacifier. Research has shown that   pacifier use during the first year of a baby's life decreases the risk of sudden infant death syndrome (SIDS). Allow your baby to feed on each breast as long as he or she wants. Breastfeed until your baby is finished feeding. When your baby unlatches or falls asleep while feeding from the first breast, offer the second breast. Because newborns are often sleepy in the first few weeks of life, you may need to awaken your baby to get him or her to feed. Breastfeeding times will vary from baby to baby. However, the following rules can serve as a guide to help you ensure that your baby is properly fed:  Newborns (babies 4 weeks of age or younger) may breastfeed every 1-3 hours.  Newborns should not go longer than 3 hours during the day or 5 hours during the night without breastfeeding.  You should breastfeed your baby a minimum of 8 times in a 24-hour period until you begin to introduce solid foods to your baby at around 6 months of age. BREAST MILK PUMPING Pumping and storing breast milk allows you to ensure that your baby is exclusively fed your breast milk, even at times when you are unable to breastfeed. This is especially important if you are going back to work while you are still breastfeeding or when you are not able to be present during feedings. Your lactation consultant can give you guidelines on how long it is safe to store breast milk. A breast pump is a machine that allows you to pump milk from your breast into a sterile bottle. The pumped breast milk can then be stored in a refrigerator or freezer. Some breast pumps are operated by hand, while others use electricity. Ask your lactation consultant which type will work best for you. Breast pumps can be purchased, but some hospitals and  breastfeeding support groups lease breast pumps on a monthly basis. A lactation consultant can teach you how to hand express breast milk, if you prefer not to use a pump. CARING FOR YOUR BREASTS WHILE YOU BREASTFEED Nipples can become dry, cracked, and sore while breastfeeding. The following recommendations can help keep your breasts moisturized and healthy:  Avoid using soap on your nipples.  Wear a supportive bra. Although not required, special nursing bras and tank tops are designed to allow access to your breasts for breastfeeding without taking off your entire bra or top. Avoid wearing underwire-style bras or extremely tight bras.  Air dry your nipples for 3-4minutes after each feeding.  Use only cotton bra pads to absorb leaked breast milk. Leaking of breast milk between feedings is normal.  Use lanolin on your nipples after breastfeeding. Lanolin helps to maintain your skin's normal moisture barrier. If you use pure lanolin, you do not need to wash it off before feeding your baby again. Pure lanolin is not toxic to your baby. You may also hand express a few drops of breast milk and gently massage that milk into your nipples and allow the milk to air dry. In the first few weeks after giving birth, some women experience extremely full breasts (engorgement). Engorgement can make your breasts feel heavy, warm, and tender to the touch. Engorgement peaks within 3-5 days after you give birth. The following recommendations can help ease engorgement:  Completely empty your breasts while breastfeeding or pumping. You may want to start by applying warm, moist heat (in the shower or with warm water-soaked hand towels) just before feeding or pumping. This increases circulation and helps the milk   flow. If your baby does not completely empty your breasts while breastfeeding, pump any extra milk after he or she is finished.  Wear a snug bra (nursing or regular) or tank top for 1-2 days to signal your body  to slightly decrease milk production.  Apply ice packs to your breasts, unless this is too uncomfortable for you.  Make sure that your baby is latched on and positioned properly while breastfeeding. If engorgement persists after 48 hours of following these recommendations, contact your health care provider or a lactation consultant. OVERALL HEALTH CARE RECOMMENDATIONS WHILE BREASTFEEDING  Eat healthy foods. Alternate between meals and snacks, eating 3 of each per day. Because what you eat affects your breast milk, some of the foods may make your baby more irritable than usual. Avoid eating these foods if you are sure that they are negatively affecting your baby.  Drink milk, fruit juice, and water to satisfy your thirst (about 10 glasses a day).  Rest often, relax, and continue to take your prenatal vitamins to prevent fatigue, stress, and anemia.  Continue breast self-awareness checks.  Avoid chewing and smoking tobacco. Chemicals from cigarettes that pass into breast milk and exposure to secondhand smoke may harm your baby.  Avoid alcohol and drug use, including marijuana. Some medicines that may be harmful to your baby can pass through breast milk. It is important to ask your health care provider before taking any medicine, including all over-the-counter and prescription medicine as well as vitamin and herbal supplements. It is possible to become pregnant while breastfeeding. If birth control is desired, ask your health care provider about options that will be safe for your baby. SEEK MEDICAL CARE IF:  You feel like you want to stop breastfeeding or have become frustrated with breastfeeding.  You have painful breasts or nipples.  Your nipples are cracked or bleeding.  Your breasts are red, tender, or warm.  You have a swollen area on either breast.  You have a fever or chills.  You have nausea or vomiting.  You have drainage other than breast milk from your nipples.  Your  breasts do not become full before feedings by the fifth day after you give birth.  You feel sad and depressed.  Your baby is too sleepy to eat well.  Your baby is having trouble sleeping.   Your baby is wetting less than 3 diapers in a 24-hour period.  Your baby has less than 3 stools in a 24-hour period.  Your baby's skin or the white part of his or her eyes becomes yellow.   Your baby is not gaining weight by 5 days of age. SEEK IMMEDIATE MEDICAL CARE IF:  Your baby is overly tired (lethargic) and does not want to wake up and feed.  Your baby develops an unexplained fever.   This information is not intended to replace advice given to you by your health care provider. Make sure you discuss any questions you have with your health care provider.   Document Released: 11/15/2005 Document Revised: 08/06/2015 Document Reviewed: 05/09/2013 [employer] Interactive Patient Education 2016 [employer]  

## 2015-12-16 NOTE — Progress Notes (Signed)
Pt needs dental letter. She has abscessed tooth, requesting antibiotics today.

## 2015-12-17 LAB — GC/CHLAMYDIA PROBE AMP (~~LOC~~) NOT AT ARMC
Chlamydia: NEGATIVE
Neisseria Gonorrhea: NEGATIVE

## 2015-12-18 LAB — CULTURE, OB URINE
Colony Count: NO GROWTH
Organism ID, Bacteria: NO GROWTH

## 2015-12-18 LAB — CYTOLOGY - PAP

## 2015-12-22 ENCOUNTER — Encounter: Payer: Self-pay | Admitting: *Deleted

## 2015-12-22 DIAGNOSIS — O09899 Supervision of other high risk pregnancies, unspecified trimester: Secondary | ICD-10-CM

## 2015-12-23 ENCOUNTER — Other Ambulatory Visit (HOSPITAL_COMMUNITY): Payer: Self-pay

## 2015-12-26 ENCOUNTER — Emergency Department (HOSPITAL_COMMUNITY)
Admission: EM | Admit: 2015-12-26 | Discharge: 2015-12-26 | Disposition: A | Discharge status: Home / Self Care | Payer: Medicaid Other | Attending: Emergency Medicine | Admitting: Emergency Medicine

## 2015-12-26 ENCOUNTER — Encounter (HOSPITAL_COMMUNITY): Payer: Self-pay | Admitting: Cardiology

## 2015-12-26 ENCOUNTER — Emergency Department (HOSPITAL_COMMUNITY): Payer: Medicaid Other

## 2015-12-26 DIAGNOSIS — K5900 Constipation, unspecified: Secondary | ICD-10-CM | POA: Insufficient documentation

## 2015-12-26 DIAGNOSIS — O9989 Other specified diseases and conditions complicating pregnancy, childbirth and the puerperium: Secondary | ICD-10-CM | POA: Diagnosis present

## 2015-12-26 DIAGNOSIS — R1031 Right lower quadrant pain: Secondary | ICD-10-CM

## 2015-12-26 DIAGNOSIS — O99612 Diseases of the digestive system complicating pregnancy, second trimester: Secondary | ICD-10-CM | POA: Insufficient documentation

## 2015-12-26 DIAGNOSIS — O99611 Diseases of the digestive system complicating pregnancy, first trimester: Secondary | ICD-10-CM

## 2015-12-26 DIAGNOSIS — Z3A14 14 weeks gestation of pregnancy: Secondary | ICD-10-CM | POA: Insufficient documentation

## 2015-12-26 DIAGNOSIS — Z8679 Personal history of other diseases of the circulatory system: Secondary | ICD-10-CM | POA: Diagnosis not present

## 2015-12-26 DIAGNOSIS — O99332 Smoking (tobacco) complicating pregnancy, second trimester: Secondary | ICD-10-CM | POA: Insufficient documentation

## 2015-12-26 DIAGNOSIS — O26899 Other specified pregnancy related conditions, unspecified trimester: Secondary | ICD-10-CM

## 2015-12-26 DIAGNOSIS — F17210 Nicotine dependence, cigarettes, uncomplicated: Secondary | ICD-10-CM | POA: Diagnosis not present

## 2015-12-26 DIAGNOSIS — Z79899 Other long term (current) drug therapy: Secondary | ICD-10-CM | POA: Diagnosis not present

## 2015-12-26 DIAGNOSIS — Z3490 Encounter for supervision of normal pregnancy, unspecified, unspecified trimester: Secondary | ICD-10-CM

## 2015-12-26 DIAGNOSIS — R109 Unspecified abdominal pain: Secondary | ICD-10-CM

## 2015-12-26 LAB — CBC
HEMATOCRIT: 35.8 % — AB (ref 36.0–46.0)
Hemoglobin: 12.1 g/dL (ref 12.0–15.0)
MCH: 28.5 pg (ref 26.0–34.0)
MCHC: 33.8 g/dL (ref 30.0–36.0)
MCV: 84.4 fL (ref 78.0–100.0)
PLATELETS: 249 10*3/uL (ref 150–400)
RBC: 4.24 MIL/uL (ref 3.87–5.11)
RDW: 12.8 % (ref 11.5–15.5)
WBC: 6.5 10*3/uL (ref 4.0–10.5)

## 2015-12-26 LAB — COMPREHENSIVE METABOLIC PANEL
ALBUMIN: 3.4 g/dL — AB (ref 3.5–5.0)
ALT: 13 U/L — AB (ref 14–54)
AST: 15 U/L (ref 15–41)
Alkaline Phosphatase: 34 U/L — ABNORMAL LOW (ref 38–126)
Anion gap: 9 (ref 5–15)
CHLORIDE: 104 mmol/L (ref 101–111)
CO2: 23 mmol/L (ref 22–32)
Calcium: 9.6 mg/dL (ref 8.9–10.3)
Creatinine, Ser: 0.58 mg/dL (ref 0.44–1.00)
GFR calc Af Amer: >60 mL/min (ref >60)
GLUCOSE: 107 mg/dL — AB (ref 65–99)
POTASSIUM: 4.5 mmol/L (ref 3.5–5.1)
SODIUM: 136 mmol/L (ref 135–145)
Total Bilirubin: 0.3 mg/dL (ref 0.3–1.2)
Total Protein: 7 g/dL (ref 6.5–8.1)

## 2015-12-26 LAB — URINE MICROSCOPIC-ADD ON

## 2015-12-26 LAB — URINALYSIS, ROUTINE W REFLEX MICROSCOPIC
Bilirubin Urine: NEGATIVE
Glucose, UA: NEGATIVE mg/dL
Hgb urine dipstick: NEGATIVE
KETONES UR: NEGATIVE mg/dL
NITRITE: NEGATIVE
PH: 6.5 (ref 5.0–8.0)
Protein, ur: NEGATIVE mg/dL
SPECIFIC GRAVITY, URINE: 1.018 (ref 1.005–1.030)

## 2015-12-26 MED ORDER — POLYETHYLENE GLYCOL 3350 17 G PO PACK: 17.0000 g | PACK | Freq: Every day | ORAL | Status: DC

## 2015-12-26 MED ORDER — DOCUSATE SODIUM 100 MG PO CAPS: 100.0000 mg | ORAL_CAPSULE | Freq: Two times a day (BID) | ORAL | Status: DC

## 2015-12-26 NOTE — Discharge Instructions (Signed)
Abdominal Pain During Pregnancy Abdominal pain is common in pregnancy. Most of the time, it does not cause harm. There are many causes of abdominal pain. Some causes are more serious than others. Some of the causes of abdominal pain in pregnancy are easily diagnosed. Occasionally, the diagnosis takes time to understand. Other times, the cause is not determined. Abdominal pain can be a sign that something is very wrong with the pregnancy, or the pain may have nothing to do with the pregnancy at all. For this reason, always tell your health care provider if you have any abdominal discomfort. HOME CARE INSTRUCTIONS  Monitor your abdominal pain for any changes. The following actions may help to alleviate any discomfort you are experiencing:  Do not have sexual intercourse or put anything in your vagina until your symptoms go away completely.  Get plenty of rest until your pain improves.  Drink clear fluids if you feel nauseous. Avoid solid food as long as you are uncomfortable or nauseous.  Only take over-the-counter or prescription medicine as directed by your health care provider.  Keep all follow-up appointments with your health care provider. SEEK IMMEDIATE MEDICAL CARE IF:  You are bleeding, leaking fluid, or passing tissue from the vagina.  You have increasing pain or cramping.  You have persistent vomiting.  You have painful or bloody urination.  You have a fever.  You notice a decrease in your baby's movements.  You have extreme weakness or feel faint.  You have shortness of breath, with or without abdominal pain.  You develop a severe headache with abdominal pain.  You have abnormal vaginal discharge with abdominal pain.  You have persistent diarrhea.  You have abdominal pain that continues even after rest, or gets worse. MAKE SURE YOU:   Understand these instructions.  Will watch your condition.  Will get help right away if you are not doing well or get worse.     This information is not intended to replace advice given to you by your health care provider. Make sure you discuss any questions you have with your health care provider.   Document Released: 11/15/2005 Document Revised: 09/05/2013 Document Reviewed: 06/14/2013 Elsevier Interactive Patient Education 2016 [employer]. Constipation, Adult Constipation is when a person has fewer than three bowel movements a week, has difficulty having a bowel movement, or has stools that are dry, hard, or larger than normal. As people grow older, constipation is more common. A low-fiber diet, not taking in enough fluids, and taking certain medicines may make constipation worse.  CAUSES   Certain medicines, such as antidepressants, pain medicine, iron supplements, antacids, and water pills.   Certain diseases, such as diabetes, irritable bowel syndrome (IBS), thyroid disease, or depression.   Not drinking enough water.   Not eating enough fiber-rich foods.   Stress or travel.   Lack of physical activity or exercise.   Ignoring the urge to have a bowel movement.   Using laxatives too much.  SIGNS AND SYMPTOMS   Having fewer than three bowel movements a week.   Straining to have a bowel movement.   Having stools that are hard, dry, or larger than normal.   Feeling full or bloated.   Pain in the lower abdomen.   Not feeling relief after having a bowel movement.  DIAGNOSIS  Your health care provider will take a medical history and perform a physical exam. Further testing may be done for severe constipation. Some tests may include:  A barium enema X-ray to  examine your rectum, colon, and, sometimes, your small intestine.   A sigmoidoscopy to examine your lower colon.   A colonoscopy to examine your entire colon. TREATMENT  Treatment will depend on the severity of your constipation and what is causing it. Some dietary treatments include drinking more fluids and eating more  fiber-rich foods. Lifestyle treatments may include regular exercise. If these diet and lifestyle recommendations do not help, your health care provider may recommend taking over-the-counter laxative medicines to help you have bowel movements. Prescription medicines may be prescribed if over-the-counter medicines do not work.  HOME CARE INSTRUCTIONS   Eat foods that have a lot of fiber, such as fruits, vegetables, whole grains, and beans.  Limit foods high in fat and processed sugars, such as french fries, hamburgers, cookies, candies, and soda.   A fiber supplement may be added to your diet if you cannot get enough fiber from foods.   Drink enough fluids to keep your urine clear or pale yellow.   Exercise regularly or as directed by your health care provider.   Go to the restroom when you have the urge to go. Do not hold it.   Only take over-the-counter or prescription medicines as directed by your health care provider. Do not take other medicines for constipation without talking to your health care provider first.  [place] IF:   You have bright red blood in your stool.   Your constipation lasts for more than 4 days or gets worse.   You have abdominal or rectal pain.   You have thin, pencil-like stools.   You have unexplained weight loss. MAKE SURE YOU:   Understand these instructions.  Will watch your condition.  Will get help right away if you are not doing well or get worse.   This information is not intended to replace advice given to you by your health care provider. Make sure you discuss any questions you have with your health care provider.   Document Released: 08/13/2004 Document Revised: 12/06/2014 Document Reviewed: 08/27/2013 Elsevier Interactive Patient Education [employer].

## 2015-12-26 NOTE — ED Notes (Signed)
Pt reports right lower quadrant pain and reports she is 3 months pregnant. Routine prenatal care at this time. Also having some constipation.

## 2015-12-26 NOTE — ED Provider Notes (Signed)
CSN: [id number]     Arrival date & time 12/26/15  1418 History   First MD Initiated Contact with Patient 12/26/15 1739     Chief Complaint  Patient presents with  . Abdominal Pain     (Consider location/radiation/quality/duration/timing/severity/associated sxs/prior Treatment) Patient is a 25 y.o. female presenting with abdominal pain. The history is provided by the patient. No language interpreter was used.  Abdominal Pain Pain location:  Generalized Pain quality: aching   Pain radiates to:  Does not radiate Pain severity:  Moderate Onset quality:  Gradual Duration:  2 days Timing:  Constant Progression:  Worsening Chronicity:  New Relieved by:  Nothing Worsened by:  Nothing tried Ineffective treatments:  None tried Associated symptoms: constipation   Pt complains of constipation for 3 weeks.  Pt reports she is 3 months pregnant.  Pt reports now having pain in right lower abdomen.  Past Medical History  Diagnosis Date  . Atrial fibrillation ([place])   . Cocaine use     Positive drug screen on 11/03/15  . Marijuana use     Positive drug screen on 11/03/15   Past Surgical History  Procedure Laterality Date  . No past surgeries     History reviewed. No pertinent family history. Social History  Substance Use Topics  . Smoking status: Current Some Day Smoker    Types: Cigarettes  . Smokeless tobacco: None  . Alcohol Use: No   OB History    Gravida Para Term Preterm AB TAB SAB Ectopic Multiple Living   1              Review of Systems  Gastrointestinal: Positive for abdominal pain and constipation.  All other systems reviewed and are negative.     Allergies  Phenergan  Home Medications   Prior to Admission medications   Medication Sig Start Date End Date Taking? Authorizing Provider  Prenatal Multivit-Min-Fe-FA (PRENATAL VITAMINS) 0.8 MG tablet Take 1 tablet by mouth daily. 11/07/15  Yes Myra Marijo Sanes, MD  amoxicillin (AMOXIL) 500 MG capsule Take 1 capsule (500  mg total) by mouth 3 (three) times daily. Patient not taking: Reported on 12/26/2015 12/16/15   [name], CNM  docusate sodium (COLACE) 100 MG capsule Take 1 capsule (100 mg total) by mouth every 12 (twelve) hours. 12/26/15   Fransico Meadow, PA-C  naproxen (NAPROSYN) 500 MG tablet Take 1 tablet (500 mg total) by mouth 2 (two) times daily. Patient not taking: Reported on 12/16/2015 06/24/15   Carlisle Cater, PA-C  polyethylene glycol [facility]) packet Take 17 g by mouth daily. 12/26/15   Fransico Meadow, PA-C   BP 121/64 mmHg  Pulse 91  Temp(Src) 98.2 F (36.8 C) (Oral)  Resp 16  Wt 97.523 kg  SpO2 100%  LMP 09/15/2015 (Approximate) Physical Exam  Constitutional: She is oriented to person, place, and time. She appears well-developed and well-nourished.  HENT:  Head: Normocephalic.  Eyes: Conjunctivae and EOM are normal. Pupils are equal, round, and reactive to light.  Neck: Normal range of motion.  Cardiovascular: Normal rate and normal heart sounds.   Pulmonary/Chest: Effort normal.  Abdominal: Soft. She exhibits no distension.  Musculoskeletal: Normal range of motion.  Neurological: She is alert and oriented to person, place, and time.  Skin: Skin is warm.  Psychiatric: She has a normal mood and affect.  Nursing note and vitals reviewed.   ED Course  Procedures (including critical care time) Labs Review Labs Reviewed  COMPREHENSIVE METABOLIC PANEL - Abnormal; Notable  for the following:    Glucose, Bld 107 (*)    BUN <5 (*)    Albumin 3.4 (*)    ALT 13 (*)    Alkaline Phosphatase 34 (*)    All other components within normal limits  CBC - Abnormal; Notable for the following:    HCT 35.8 (*)    All other components within normal limits  URINALYSIS, ROUTINE W REFLEX MICROSCOPIC (NOT AT [facility]) - Abnormal; Notable for the following:    APPearance HAZY (*)    Leukocytes, UA SMALL (*)    All other components within normal limits  URINE MICROSCOPIC-ADD ON - Abnormal; Notable for  the following:    Squamous Epithelial / LPF 6-30 (*)    Bacteria, UA MANY (*)    All other components within normal limits    Imaging Review US Ob Limited  12/26/2015  CLINICAL DATA:  Right lower quadrant pain today. Estimated gestational age by LMP is 14 weeks 4 days. EXAM: LIMITED OBSTETRIC ULTRASOUND FINDINGS: Number of Fetuses: 1 Heart Rate:  157 bpm Movement: Fetal movement is observed. Presentation: Cephalic presentation. Placental Location: Posterior Previa: Placenta is low lying and appears marginal. Placental edge measures about 6 mm from the cervical os. Amniotic Fluid (Subjective):  Within normal limits. BPD:  2.7cm 14w  6d MATERNAL FINDINGS: Cervix:  Appears closed. Uterus/Adnexae: Limited visualization. No discrete myometrial mass lesions identified. Right ovary is identified, measuring 2.6 x 1.9 x 2.9 cm. There is a corpus luteal cyst on the right ovary measuring about 1.5 cm diameter. The left ovary is not identified. IMPRESSION: Single intrauterine pregnancy. Posterior placenta is marginal, arising about 6 mm from the cervical os. Cervix appears closed. This exam is performed on an emergent basis and does not comprehensively evaluate fetal size, dating, or anatomy; follow-up complete OB US should be considered if further fetal assessment is warranted. Electronically Signed   By: Lucienne Capers M.D.   On: 12/26/2015 19:07   I have personally reviewed and evaluated these images and lab results as part of my medical decision-making.   EKG Interpretation None      MDM Pt stable ultrasound shows small ovarian cyst.  CBC shows normal WBC's  Pt counseled on constipation.  Pt advised, walking, increase water, miralax and colace.     Final diagnoses:  Pregnancy  Constipation during pregnancy, first trimester    Meds ordered this encounter  Medications  . polyethylene glycol (MIRALAX) packet    Sig: Take 17 g by mouth daily.    Dispense:  14 each    Refill:  0    Order Specific  Question:  Supervising Provider    Answer:  Sabra Heck, BRIAN [[id number]]  . docusate sodium (COLACE) 100 MG capsule    Sig: Take 1 capsule (100 mg total) by mouth every 12 (twelve) hours.    Dispense:  60 capsule    Refill:  0    Order Specific Question:  Supervising Provider    Answer:  Noemi Chapel [place], PA-C 12/26/15 [place], MD 12/27/15 Laureen Abrahams

## 2016-01-13 ENCOUNTER — Encounter: Payer: Self-pay | Admitting: Advanced Practice Midwife

## 2016-01-13 ENCOUNTER — Ambulatory Visit (INDEPENDENT_AMBULATORY_CARE_PROVIDER_SITE_OTHER): Payer: Medicaid Other | Admitting: Advanced Practice Midwife

## 2016-01-13 VITALS — BP 114/65 | HR 90 | Temp 98.5°F | Wt 213.7 lb

## 2016-01-13 DIAGNOSIS — Z3492 Encounter for supervision of normal pregnancy, unspecified, second trimester: Secondary | ICD-10-CM

## 2016-01-13 DIAGNOSIS — O09899 Supervision of other high risk pregnancies, unspecified trimester: Secondary | ICD-10-CM

## 2016-01-13 LAB — POCT URINALYSIS DIP (DEVICE)
BILIRUBIN URINE: NEGATIVE
Glucose, UA: NEGATIVE mg/dL
HGB URINE DIPSTICK: NEGATIVE
KETONES UR: NEGATIVE mg/dL
NITRITE: NEGATIVE
PH: 6 (ref 5.0–8.0)
PROTEIN: NEGATIVE mg/dL
Specific Gravity, Urine: 1.02 (ref 1.005–1.030)
Urobilinogen, UA: 1 mg/dL (ref 0.0–1.0)

## 2016-01-13 NOTE — Patient Instructions (Signed)

## 2016-01-13 NOTE — Progress Notes (Signed)
Subjective:  Nichole Bowers is a 25 y.o. G1P0 at [redacted]w[redacted]d being seen today for ongoing prenatal care.  She is currently monitored for the following issues for this high-risk pregnancy and has Rh negative state in antepartum period; UTI in pregnancy; Cocaine abuse complicating pregnancy; Marijuana use; and Supervision of other high risk pregnancy, antepartum on her problem list.  Patient reports no complaints but does have some pressure at times, Some round ligament pains. Contractions: Not present.  .  Movement: Present. Denies leaking of fluid.   The following portions of the patient's history were reviewed and updated as appropriate: allergies, current medications, past family history, past medical history, past social history, past surgical history and problem list. Problem list updated.  Objective:   Filed Vitals:   01/13/16 1244  BP: 114/65  Pulse: 90  Temp: 98.5 F (36.9 C)  Weight: 213 lb 11.2 oz (96.934 kg)    Fetal Status: Fetal Heart Rate (bpm): 151   Movement: Present     General:  Alert, oriented and cooperative. Patient is in no acute distress.  Skin: Skin is warm and dry. No rash noted.   Cardiovascular: Normal heart rate noted  Respiratory: Normal respiratory effort, no problems with respiration noted  Abdomen: Soft, gravid, appropriate for gestational age. Pain/Pressure: Absent     Pelvic:       Cervical exam deferred        Extremities: Normal range of motion.  Edema: None  Mental Status: Normal mood and affect. Normal behavior. Normal judgment and thought content.   Urinalysis: Urine Protein: Negative Urine Glucose: Negative  Assessment and Plan:  Pregnancy: G1P0 at [redacted]w[redacted]d  1. Supervision of normal pregnancy, second trimester      Discussed need for AFP only today - Alpha fetoprotein, maternal - [place] MFM OB COMP + 14 WK; Future  2. Supervision of other high risk pregnancy, antepartum, unspecified trimester      Will schedule anatomy US for next month  Preterm  labor symptoms and general obstetric precautions including but not limited to vaginal bleeding, contractions, leaking of fluid and fetal movement were reviewed in detail with the patient. Please refer to After Visit Summary for other counseling recommendations.   RTC 4 weeks  Seabron Spates, CNM

## 2016-01-14 LAB — ALPHA FETOPROTEIN, MATERNAL
AFP: 35 ng/mL
CURR GEST AGE: 16.2 wks.days
MoM for AFP: 1.11
Open Spina bifida: NEGATIVE
Osb Risk: 1:18300 {titer}

## 2016-02-09 ENCOUNTER — Ambulatory Visit (HOSPITAL_COMMUNITY)
Admission: RE | Admit: 2016-02-09 | Discharge: 2016-02-09 | Disposition: A | Discharge status: Home / Self Care | Payer: Medicaid Other | Source: Ambulatory Visit | Attending: Advanced Practice Midwife | Admitting: Advanced Practice Midwife

## 2016-02-09 ENCOUNTER — Other Ambulatory Visit: Payer: Self-pay | Admitting: Advanced Practice Midwife

## 2016-02-09 DIAGNOSIS — Z3689 Encounter for other specified antenatal screening: Secondary | ICD-10-CM

## 2016-02-09 DIAGNOSIS — O99212 Obesity complicating pregnancy, second trimester: Secondary | ICD-10-CM

## 2016-02-09 DIAGNOSIS — O99322 Drug use complicating pregnancy, second trimester: Secondary | ICD-10-CM

## 2016-02-09 DIAGNOSIS — Z3A20 20 weeks gestation of pregnancy: Secondary | ICD-10-CM

## 2016-02-09 DIAGNOSIS — Z36 Encounter for antenatal screening of mother: Secondary | ICD-10-CM | POA: Diagnosis present

## 2016-02-09 DIAGNOSIS — Z3492 Encounter for supervision of normal pregnancy, unspecified, second trimester: Secondary | ICD-10-CM

## 2016-02-09 DIAGNOSIS — O09899 Supervision of other high risk pregnancies, unspecified trimester: Secondary | ICD-10-CM

## 2016-02-10 ENCOUNTER — Ambulatory Visit (INDEPENDENT_AMBULATORY_CARE_PROVIDER_SITE_OTHER): Payer: Medicaid Other | Admitting: Student

## 2016-02-10 VITALS — BP 125/54 | HR 88 | Temp 98.2°F | Wt 217.9 lb

## 2016-02-10 DIAGNOSIS — O09892 Supervision of other high risk pregnancies, second trimester: Secondary | ICD-10-CM

## 2016-02-10 DIAGNOSIS — O219 Vomiting of pregnancy, unspecified: Secondary | ICD-10-CM

## 2016-02-10 DIAGNOSIS — O99322 Drug use complicating pregnancy, second trimester: Secondary | ICD-10-CM

## 2016-02-10 DIAGNOSIS — F1410 Cocaine abuse, uncomplicated: Secondary | ICD-10-CM | POA: Diagnosis not present

## 2016-02-10 LAB — POCT URINALYSIS DIP (DEVICE)
Bilirubin Urine: NEGATIVE
Glucose, UA: NEGATIVE mg/dL
Hgb urine dipstick: NEGATIVE
Ketones, ur: NEGATIVE mg/dL
NITRITE: NEGATIVE
PH: 6 (ref 5.0–8.0)
PROTEIN: NEGATIVE mg/dL
Specific Gravity, Urine: 1.02 (ref 1.005–1.030)
Urobilinogen, UA: 0.2 mg/dL (ref 0.0–1.0)

## 2016-02-10 MED ORDER — ONDANSETRON 8 MG PO TBDP: 8.0000 mg | ORAL_TABLET | Freq: Three times a day (TID) | ORAL | Status: DC | PRN

## 2016-02-10 NOTE — Progress Notes (Signed)
Subjective:  Nichole Bowers is a 25 y.o. G1P0 at [redacted]w[redacted]d being seen today for ongoing prenatal care.  She is currently monitored for the following issues for this high-risk pregnancy and has Rh negative state in antepartum period; UTI in pregnancy; Cocaine abuse complicating pregnancy; Marijuana use; and Supervision of other high risk pregnancy, antepartum on her problem list.  Patient reports nausea and vomiting since last night. Denies abdominal pain, diarrhea, or constipation.  Contractions: Not present. Vag. Bleeding: None.  Movement: Present. Denies leaking of fluid.   The following portions of the patient's history were reviewed and updated as appropriate: allergies, current medications, past family history, past medical history, past social history, past surgical history and problem list. Problem list updated.  Objective:   Filed Vitals:   02/10/16 1056  BP: 125/54  Pulse: 88  Temp: 98.2 F (36.8 C)  Weight: 217 lb 14.4 oz (98.839 kg)    Fetal Status: Fetal Heart Rate (bpm): 136   Movement: Present     General:  Alert, oriented and cooperative. Patient is in no acute distress.  Skin: Skin is warm and dry. No rash noted.   Cardiovascular: Normal heart rate noted  Respiratory: Normal respiratory effort, no problems with respiration noted  Abdomen: Soft, gravid, appropriate for gestational age. Pain/Pressure: Present    Fundal height 21 cm  Pelvic: Vag. Bleeding: None     Cervical exam deferred        Extremities: Normal range of motion.  Edema: None  Mental Status: Normal mood and affect. Normal behavior. Normal judgment and thought content.   Urinalysis: Urine Protein: Negative Urine Glucose: Negative  Assessment and Plan:  Pregnancy: G1P0 at [redacted]w[redacted]d  1. Cocaine abuse complicating pregnancy, second trimester  - Prescript Monitor Profile(19)  2. Supervision of other high risk pregnancy, antepartum, second trimester   3. Nausea and vomiting in pregnancy prior to [redacted] weeks  gestation  - ondansetron (ZOFRAN ODT) 8 MG disintegrating tablet; Take 1 tablet (8 mg total) by mouth every 8 (eight) hours as needed for nausea or vomiting.  Dispense: 20 tablet; Refill: 0  Preterm labor symptoms and general obstetric precautions including but not limited to vaginal bleeding, contractions, leaking of fluid and fetal movement were reviewed in detail with the patient. Please refer to After Visit Summary for other counseling recommendations.  Return in about 4 weeks (around 03/09/2016) for Routine OB.   Jorje Guild, NP

## 2016-02-10 NOTE — Patient Instructions (Signed)

## 2016-02-10 NOTE — Progress Notes (Signed)
Breastfeeding tip of the week reviewed. 

## 2016-02-14 LAB — PRESCRIPTION MONITORING PROFILE (19 PANEL)
AMPHETAMINE/METH: NEGATIVE ng/mL
BUPRENORPHINE, URINE: NEGATIVE ng/mL
Barbiturate Screen, Urine: NEGATIVE ng/mL
Benzodiazepine Screen, Urine: NEGATIVE ng/mL
CARISOPRODOL, URINE: NEGATIVE ng/mL
Cocaine Metabolites: NEGATIVE ng/mL
Creatinine, Urine: 182.28 mg/dL (ref >20.0)
Fentanyl, Ur: NEGATIVE ng/mL
MDMA URINE: NEGATIVE ng/mL
MEPERIDINE UR: NEGATIVE ng/mL
METHADONE SCREEN, URINE: NEGATIVE ng/mL
METHAQUALONE SCREEN (URINE): NEGATIVE ng/mL
NITRITES URINE, INITIAL: NEGATIVE ug/mL
Opiate Screen, Urine: NEGATIVE ng/mL
Oxycodone Screen, Ur: NEGATIVE ng/mL
PROPOXYPHENE: NEGATIVE ng/mL
Phencyclidine, Ur: NEGATIVE ng/mL
TAPENTADOLUR: NEGATIVE ng/mL
Tramadol Scrn, Ur: NEGATIVE ng/mL
Zolpidem, Urine: NEGATIVE ng/mL
pH, Initial: 6.1 pH (ref 4.5–8.9)

## 2016-02-14 LAB — CANNABANOIDS (GC/LC/MS), URINE: THC-COOH (GC/LC/MS), ur confirm: 80 ng/mL — AB (ref <5)

## 2016-02-20 ENCOUNTER — Inpatient Hospital Stay (HOSPITAL_COMMUNITY)
Admission: AD | Admit: 2016-02-20 | Discharge: 2016-02-20 | Disposition: A | Discharge status: Home / Self Care | Payer: Medicaid Other | Source: Ambulatory Visit | Attending: Obstetrics & Gynecology | Admitting: Obstetrics & Gynecology

## 2016-02-20 ENCOUNTER — Encounter (HOSPITAL_COMMUNITY): Payer: Self-pay | Admitting: *Deleted

## 2016-02-20 DIAGNOSIS — Z3A21 21 weeks gestation of pregnancy: Secondary | ICD-10-CM | POA: Diagnosis not present

## 2016-02-20 DIAGNOSIS — I4891 Unspecified atrial fibrillation: Secondary | ICD-10-CM | POA: Diagnosis not present

## 2016-02-20 DIAGNOSIS — F1490 Cocaine use, unspecified, uncomplicated: Secondary | ICD-10-CM | POA: Diagnosis not present

## 2016-02-20 DIAGNOSIS — N6019 Diffuse cystic mastopathy of unspecified breast: Secondary | ICD-10-CM | POA: Diagnosis not present

## 2016-02-20 DIAGNOSIS — N6310 Unspecified lump in the right breast, unspecified quadrant: Secondary | ICD-10-CM

## 2016-02-20 DIAGNOSIS — O09892 Supervision of other high risk pregnancies, second trimester: Secondary | ICD-10-CM

## 2016-02-20 DIAGNOSIS — Z87891 Personal history of nicotine dependence: Secondary | ICD-10-CM | POA: Diagnosis not present

## 2016-02-20 DIAGNOSIS — N6011 Diffuse cystic mastopathy of right breast: Secondary | ICD-10-CM

## 2016-02-20 DIAGNOSIS — F1290 Cannabis use, unspecified, uncomplicated: Secondary | ICD-10-CM | POA: Diagnosis not present

## 2016-02-20 DIAGNOSIS — O99322 Drug use complicating pregnancy, second trimester: Secondary | ICD-10-CM | POA: Diagnosis not present

## 2016-02-20 DIAGNOSIS — N644 Mastodynia: Secondary | ICD-10-CM

## 2016-02-20 DIAGNOSIS — O26892 Other specified pregnancy related conditions, second trimester: Secondary | ICD-10-CM | POA: Insufficient documentation

## 2016-02-20 DIAGNOSIS — N63 Unspecified lump in breast: Secondary | ICD-10-CM | POA: Diagnosis not present

## 2016-02-20 NOTE — MAU Provider Note (Signed)
Chief Complaint:  Lump in breast    None     HPI: Nichole Bowers is a 24 y.o. G1P0 at 65w5dwho presents to maternity admissions reporting painful breast lump in her right breast she noticed 2 days ago. She reports the lump was small and painful 2 days ago and feels larger and more tender today.  She describes the pain as soreness or tenderness.  She has not tried anything for pain.  She is worried about breast cancer. She denies significant family history.  She is seen in [facility] for high risk pregnancy r/t drug use during pregnancy.  Otherwise her pregnancy has been uncomplicated.   She denies cramping/contractions, LOF, vaginal bleeding, vaginal itching/burning, urinary symptoms, h/a, dizziness, n/v, or fever/chills.    HPI  Past Medical History: Past Medical History  Diagnosis Date  . Atrial fibrillation ([place])   . Cocaine use     Positive drug screen on 11/03/15  . Marijuana use     Positive drug screen on 11/03/15    Past obstetric history: OB History  Gravida Para Term Preterm AB SAB TAB Ectopic Multiple Living  1         0    # Outcome Date GA Lbr Len/2nd Weight Sex Delivery Anes PTL Lv  1 Current               Past Surgical History: Past Surgical History  Procedure Laterality Date  . Wisdom tooth extraction      Family History: History reviewed. No pertinent family history.  Social History: Social History  Substance Use Topics  . Smoking status: Former Smoker    Types: Cigarettes    Quit date: 11/19/2015  . Smokeless tobacco: Never Used  . Alcohol Use: No    Allergies:  Allergies  Allergen Reactions  . Phenergan [Promethazine] Nausea And Vomiting    MADE VOMITING WORSE    Meds:  Prescriptions prior to admission  Medication Sig Dispense Refill Last Dose  . docusate sodium (COLACE) 100 MG capsule Take 1 capsule (100 mg total) by mouth every 12 (twelve) hours. (Patient taking differently: Take 100 mg by mouth 2 (two) times daily as needed for mild  constipation. ) 60 capsule 0 Past Month at Unknown time  . ondansetron (ZOFRAN ODT) 8 MG disintegrating tablet Take 1 tablet (8 mg total) by mouth every 8 (eight) hours as needed for nausea or vomiting. 20 tablet 0 Past Week at Unknown time  . polyethylene glycol (MIRALAX) packet Take 17 g by mouth daily. (Patient taking differently: Take 17 g by mouth daily as needed for mild constipation. ) 14 each 0 Past Month at Unknown time  . Prenatal Multivit-Min-Fe-FA (PRENATAL VITAMINS) 0.8 MG tablet Take 1 tablet by mouth daily. 30 tablet 1 02/19/2016 at Unknown time  . [DISCONTINUED] naproxen (NAPROSYN) 500 MG tablet Take 1 tablet (500 mg total) by mouth 2 (two) times daily. (Patient not taking: Reported on 02/10/2016) 20 tablet 0 Not Taking    ROS:  Review of Systems  Constitutional: Negative for fever, chills and fatigue.  Respiratory: Negative for shortness of breath.   Cardiovascular: Negative for chest pain.  Genitourinary: Negative for dysuria, flank pain, vaginal bleeding, vaginal discharge, difficulty urinating, vaginal pain and pelvic pain.  Neurological: Negative for dizziness and headaches.  Psychiatric/Behavioral: Negative.      I have reviewed patient's Past Medical Hx, Surgical Hx, Family Hx, Social Hx, medications and allergies.   Physical Exam   Patient Vitals for the past 24 hrs:  BP Temp Temp src Pulse Resp  02/20/16 1026 119/72 mmHg 98.3 F (36.8 C) Oral 98 18  Physical Exam  Pulmonary/Chest:       Constitutional: Well-developed, well-nourished female in no acute distress.  Cardiovascular: normal rate Respiratory: normal effort GI: Abd soft, non-tender, gravid appropriate for gestational age.  MS: Extremities nontender, no edema, normal ROM Neurologic: Alert and oriented x 4.  GU: Neg CVAT.  PELVIC EXAM: Cervix pink, visually closed, without lesion, scant white creamy discharge, vaginal walls and external genitalia normal Bimanual exam: Cervix 0/long/high, firm,  anterior, neg CMT, uterus nontender, nonenlarged, adnexa without tenderness, enlargement, or mass     FHT:  138 by doppler   Labs: No results found for this or any previous visit (from the past 24 hour(s)). AB/NEG/-- (12/05 1119)  Imaging:  [place] Mfm Ob Comp + 14 Wk  02/09/2016  OBSTETRICAL ULTRASOUND: This exam was performed within a [facility]. The OB US report was generated in the AS system, and faxed to the ordering physician.  This report is available in the [employer]. See the [facility] Obstetric US report via the Image Link.   MAU Course/MDM: Breast exam consistent with benign fibrocystic breast changes. Reassurance provided to pt. Pt expresses anxiety about breast lumps, concern about breast cancer.  Outpatient breast US ordered at [facility].  Pt to f/u as scheduled in [place]. Pt stable at time of discharge.  Assessment: 1. Lump of breast, right   2. Supervision of other high risk pregnancy, antepartum, second trimester   3. Fibrocystic breast changes, right   4. Breast tenderness in female     Plan: Discharge home Increase PO fluids, decrease caffeine intake Tylenol, warm shower/heat for tenderness Preterm precautions and fetal kick counts    Medication List    TAKE these medications        docusate sodium 100 MG capsule  Commonly known as:  COLACE  Take 1 capsule (100 mg total) by mouth every 12 (twelve) hours.     ondansetron 8 MG disintegrating tablet  Commonly known as:  ZOFRAN ODT  Take 1 tablet (8 mg total) by mouth every 8 (eight) hours as needed for nausea or vomiting.     polyethylene glycol packet  Commonly known as:  MIRALAX  Take 17 g by mouth daily.     Prenatal Vitamins 0.8 MG tablet  Take 1 tablet by mouth daily.        Fatima Blank Certified Nurse-Midwife 02/20/2016 11:35 AM

## 2016-02-20 NOTE — Discharge Instructions (Signed)
Fibrocystic Breast Changes °Fibrocystic breast changes occur when breast ducts become blocked, causing painful, fluid-filled lumps (cysts) to form in the breast. This is a common condition that is noncancerous (benign). It occurs when women go through hormonal changes during their menstrual cycle. Fibrocystic breast changes can affect one or both breasts. °CAUSES  °The exact cause of fibrocystic breast changes is not known, but it may be related to the female hormones estrogen and progesterone. Family traits that get passed from parent to child (genetics) may also be a factor in some cases. °SIGNS AND SYMPTOMS  °· Tenderness, mild discomfort, or pain.   °· Swelling.   °· Rope-like feeling when touching the breast.   °· Lumpy breast, one or both sides.   °· Changes in breast size, especially before (larger) and after (smaller) the menstrual period.   °· Green or dark brown nipple discharge (not blood).   °Symptoms are usually worse before menstrual periods start and get better toward the end of the menstrual period.  °DIAGNOSIS  °To make a diagnosis, your health care provider will ask you questions and perform a physical exam of your breasts. The health care provider may recommend other tests that can examine inside your breasts, such as: °· A breast X-ray (mammogram).   °· Ultrasonography.  °· An MRI.   °If something more than fibrocystic breast changes is suspected, your health care provider may take a breast tissue sample (breast biopsy) to examine. °TREATMENT  °Often, treatment is not needed. Your health care provider may recommend over-the-counter pain relievers to help lessen pain or discomfort caused by the fibrocystic breast changes. You may also be asked to change your diet to limit or stop eating foods or drinking beverages that contain caffeine. Foods and beverages that contain caffeine include chocolate, soda, coffee, and tea. Reducing sugar and fat in your diet may also help. Your health care provider  may also recommend: °· Fine needle aspiration to remove fluid from a cyst that is causing pain.   °· Surgery to remove a large, persistent, and tender cyst. °HOME CARE INSTRUCTIONS  °· Examine your breasts after every menstrual period. If you do not have menstrual periods, check your breasts the first day of every month. Feel for changes, such as more tenderness, a new growth, a change in breast size, or a change in a lump that has always been there.   °· Only take over-the-counter or prescription medicine as directed by your health care provider.   °· Wear a well-fitted support or sports bra, especially when exercising.   °· Decrease or avoid caffeine, fat, and sugar in your diet as directed by your health care provider.   °SEEK MEDICAL CARE IF:  °· You have fluid leaking (discharge) from your nipples, especially bloody discharge.   °· You have new lumps or bumps in the breast.   °· Your breast or breasts become enlarged, red, and painful.   °· You have areas of your breast that pucker in.   °· Your nipples appear flat or indented.   °  °This information is not intended to replace advice given to you by your health care provider. Make sure you discuss any questions you have with your health care provider. °  °Document Released: 09/01/2006 Document Revised: 08/06/2015 Document Reviewed: 05/06/2013 °Elsevier Interactive Patient Education ©2016 Elsevier Inc. ° ° °

## 2016-02-20 NOTE — MAU Note (Signed)
Yesterday noticed a lump in R breast. Slightly tender to touch. No redness.

## 2016-03-01 ENCOUNTER — Other Ambulatory Visit: Payer: Self-pay | Admitting: Advanced Practice Midwife

## 2016-03-03 ENCOUNTER — Ambulatory Visit
Admission: RE | Admit: 2016-03-03 | Discharge: 2016-03-03 | Disposition: A | Discharge status: Home / Self Care | Payer: Medicaid Other | Source: Ambulatory Visit | Attending: Advanced Practice Midwife | Admitting: Advanced Practice Midwife

## 2016-03-03 DIAGNOSIS — N6310 Unspecified lump in the right breast, unspecified quadrant: Secondary | ICD-10-CM

## 2016-03-03 DIAGNOSIS — N6011 Diffuse cystic mastopathy of right breast: Secondary | ICD-10-CM

## 2016-03-09 ENCOUNTER — Ambulatory Visit (INDEPENDENT_AMBULATORY_CARE_PROVIDER_SITE_OTHER): Payer: Medicaid Other | Admitting: Advanced Practice Midwife

## 2016-03-09 VITALS — BP 113/53 | HR 88 | Wt 220.3 lb

## 2016-03-09 DIAGNOSIS — O09892 Supervision of other high risk pregnancies, second trimester: Secondary | ICD-10-CM

## 2016-03-09 LAB — POCT URINALYSIS DIP (DEVICE)
BILIRUBIN URINE: NEGATIVE
GLUCOSE, UA: NEGATIVE mg/dL
HGB URINE DIPSTICK: NEGATIVE
KETONES UR: NEGATIVE mg/dL
Nitrite: NEGATIVE
Protein, ur: NEGATIVE mg/dL
SPECIFIC GRAVITY, URINE: 1.015 (ref 1.005–1.030)
Urobilinogen, UA: 0.2 mg/dL (ref 0.0–1.0)
pH: 6.5 (ref 5.0–8.0)

## 2016-03-09 NOTE — Progress Notes (Signed)
Subjective:  Nichole Bowers is a 25 y.o. G1P0 at [redacted]w[redacted]d being seen today for ongoing prenatal care.  She is currently monitored for the following issues for this high-risk pregnancy and has Rh negative state in antepartum period; UTI in pregnancy; Cocaine abuse complicating pregnancy; Marijuana use; and Supervision of other high risk pregnancy, antepartum on her problem list.  Patient reports no complaints.  Contractions: Not present. Vag. Bleeding: None.  Movement: Present. Denies leaking of fluid.   The following portions of the patient's history were reviewed and updated as appropriate: allergies, current medications, past family history, past medical history, past social history, past surgical history and problem list. Problem list updated.  Objective:   Filed Vitals:   03/09/16 1624  BP: 113/53  Pulse: 88  Weight: 220 lb 4.8 oz (99.927 kg)    Fetal Status: Fetal Heart Rate (bpm): 141 Fundal Height: 25 cm Movement: Present     General:  Alert, oriented and cooperative. Patient is in no acute distress.  Skin: Skin is warm and dry. No rash noted.   Cardiovascular: Normal heart rate noted  Respiratory: Normal respiratory effort, no problems with respiration noted  Abdomen: Soft, gravid, appropriate for gestational age. Pain/Pressure: Absent     Pelvic: Vag. Bleeding: None     Cervical exam deferred        Extremities: Normal range of motion.  Edema: None  Mental Status: Normal mood and affect. Normal behavior. Normal judgment and thought content.   Urinalysis: Urine Protein: Negative Urine Glucose: Negative  Assessment and Plan:  Pregnancy: G1P0 at [redacted]w[redacted]d  There are no diagnoses linked to this encounter. Preterm labor symptoms and general obstetric precautions including but not limited to vaginal bleeding, contractions, leaking of fluid and fetal movement were reviewed in detail with the patient. Please refer to After Visit Summary for other counseling recommendations.  Return  in about 4 weeks (around 04/06/2016) for ROB/GTT.   [name], CNM

## 2016-03-09 NOTE — Patient Instructions (Addendum)
Preterm Labor Information Preterm labor is when labor starts at less than 37 weeks of pregnancy. The normal length of a pregnancy is 39 to 41 weeks. CAUSES Often, there is no identifiable underlying cause as to why a woman goes into preterm labor. One of the most common known causes of preterm labor is infection. Infections of the uterus, cervix, vagina, amniotic sac, bladder, kidney, or even the lungs (pneumonia) can cause labor to start. Other suspected causes of preterm labor include:   Urogenital infections, such as yeast infections and bacterial vaginosis.   Uterine abnormalities (uterine shape, uterine septum, fibroids, or bleeding from the placenta).   A cervix that has been operated on (it may fail to stay closed).   Malformations in the fetus.   Multiple gestations (twins, triplets, and so on).   Breakage of the amniotic sac.  RISK FACTORS  Having a previous history of preterm labor.   Having premature rupture of membranes (PROM).   Having a placenta that covers the opening of the cervix (placenta previa).   Having a placenta that separates from the uterus (placental abruption).   Having a cervix that is too weak to hold the fetus in the uterus (incompetent cervix).   Having too much fluid in the amniotic sac (polyhydramnios).   Taking illegal drugs or smoking while pregnant.   Not gaining enough weight while pregnant.   Being younger than 2 and older than 24 years old.   Having a low socioeconomic status.   Being African American. SYMPTOMS Signs and symptoms of preterm labor include:   Menstrual-like cramps, abdominal pain, or back pain.  Uterine contractions that are regular, as frequent as six in an hour, regardless of their intensity (may be mild or painful).  Contractions that start on the top of the uterus and spread down to the lower abdomen and back.   A sense of increased pelvic pressure.   A watery or bloody mucus discharge that  comes from the vagina.  TREATMENT Depending on the length of the pregnancy and other circumstances, your health care provider may suggest bed rest. If necessary, there are medicines that can be given to stop contractions and to mature the fetal lungs. If labor happens before 34 weeks of pregnancy, a prolonged hospital stay may be recommended. Treatment depends on the condition of both you and the fetus.  WHAT SHOULD YOU DO IF YOU THINK YOU ARE IN PRETERM LABOR? Call your health care provider right away. You will need to go to the hospital to get checked immediately. HOW CAN YOU PREVENT PRETERM LABOR IN FUTURE PREGNANCIES? You should:   Stop smoking if you smoke.  Maintain healthy weight gain and avoid chemicals and drugs that are not necessary.  Be watchful for any type of infection.  Inform your health care provider if you have a known history of preterm labor.   This information is not intended to replace advice given to you by your health care provider. Make sure you discuss any questions you have with your health care provider.   Document Released: 02/05/2004 Document Revised: 07/18/2013 Document Reviewed: 12/18/2012 Elsevier Interactive Patient Education 2016 [employer].  Tdap Vaccine (Tetanus, Diphtheria and Pertussis): What You Need to Know 1. Why get vaccinated? Tetanus, diphtheria and pertussis are very serious diseases. Tdap vaccine can protect [place] from these diseases. And, Tdap vaccine given to pregnant women can protect newborn babies against pertussis. TETANUS (Lockjaw) is rare in the [place] today. It causes painful muscle tightening and  stiffness, usually all over the body.  It can lead to tightening of muscles in the head and neck so you can't open your mouth, swallow, or sometimes even breathe. Tetanus kills about 1 out of 10 people who are infected even after receiving the best medical care. DIPHTHERIA is also rare in the [place] today. It can cause a  thick coating to form in the back of the throat.  It can lead to breathing problems, heart failure, paralysis, and death. PERTUSSIS (Whooping Cough) causes severe coughing spells, which can cause difficulty breathing, vomiting and disturbed sleep.  It can also lead to weight loss, incontinence, and rib fractures. Up to 2 in 100 adolescents and 5 in 100 adults with pertussis are hospitalized or have complications, which could include pneumonia or death. These diseases are caused by bacteria. Diphtheria and pertussis are spread from person to person through secretions from coughing or sneezing. Tetanus enters the body through cuts, scratches, or wounds. Before vaccines, as many as 200,000 cases of diphtheria, 200,000 cases of pertussis, and hundreds of cases of tetanus, were reported in the [place] each year. Since vaccination began, reports of cases for tetanus and diphtheria have dropped by about 99% and for pertussis by about 80%. 2. Tdap vaccine Tdap vaccine can protect adolescents and adults from tetanus, diphtheria, and pertussis. One dose of Tdap is routinely given at age 27 or 27. People who did not get Tdap at that age should get it as soon as possible. Tdap is especially important for healthcare professionals and anyone having close contact with a baby younger than 12 months. Pregnant women should get a dose of Tdap during every pregnancy, to protect the newborn from pertussis. Infants are most at risk for severe, life-threatening complications from pertussis. Another vaccine, called Td, protects against tetanus and diphtheria, but not pertussis. A Td booster should be given every 10 years. Tdap may be given as one of these boosters if you have never gotten Tdap before. Tdap may also be given after a severe cut or burn to prevent tetanus infection. Your doctor or the person giving you the vaccine can give you more information. Tdap may safely be given at the same time as other  vaccines. 3. Some people should not get this vaccine  A person who has ever had a life-threatening allergic reaction after a previous dose of any diphtheria, tetanus or pertussis containing vaccine, OR has a severe allergy to any part of this vaccine, should not get Tdap vaccine. Tell the person giving the vaccine about any severe allergies.  Anyone who had coma or long repeated seizures within 7 days after a childhood dose of DTP or DTaP, or a previous dose of Tdap, should not get Tdap, unless a cause other than the vaccine was found. They can still get Td.  Talk to your doctor if you:  have seizures or another nervous system problem,  had severe pain or swelling after any vaccine containing diphtheria, tetanus or pertussis,  ever had a condition called Guillain-Barr Syndrome (GBS),  aren't feeling well on the day the shot is scheduled. 4. Risks With any medicine, including vaccines, there is a chance of side effects. These are usually mild and go away on their own. Serious reactions are also possible but are rare. Most people who get Tdap vaccine do not have any problems with it. Mild problems following Tdap (Did not interfere with activities)  Pain where the shot was given (about 3 in 4 adolescents or  2 in 3 adults)  Redness or swelling where the shot was given (about 1 person in 5)  Mild fever of at least 100.10F (up to about 1 in 25 adolescents or 1 in 100 adults)  Headache (about 3 or 4 people in 10)  Tiredness (about 1 person in 3 or 4)  Nausea, vomiting, diarrhea, stomach ache (up to 1 in 4 adolescents or 1 in 10 adults)  Chills, sore joints (about 1 person in 10)  Body aches (about 1 person in 3 or 4)  Rash, swollen glands (uncommon) Moderate problems following Tdap (Interfered with activities, but did not require medical attention)  Pain where the shot was given (up to 1 in 5 or 6)  Redness or swelling where the shot was given (up to about 1 in 16 adolescents or  1 in 12 adults)  Fever over 102F (about 1 in 100 adolescents or 1 in 250 adults)  Headache (about 1 in 7 adolescents or 1 in 10 adults)  Nausea, vomiting, diarrhea, stomach ache (up to 1 or 3 people in 100)  Swelling of the entire arm where the shot was given (up to about 1 in 500). Severe problems following Tdap (Unable to perform usual activities; required medical attention)  Swelling, severe pain, bleeding and redness in the arm where the shot was given (rare). Problems that could happen after any vaccine:  People sometimes faint after a medical procedure, including vaccination. Sitting or lying down for about 15 minutes can help prevent fainting, and injuries caused by a fall. Tell your doctor if you feel dizzy, or have vision changes or ringing in the ears.  Some people get severe pain in the shoulder and have difficulty moving the arm where a shot was given. This happens very rarely.  Any medication can cause a severe allergic reaction. Such reactions from a vaccine are very rare, estimated at fewer than 1 in a million doses, and would happen within a few minutes to a few hours after the vaccination. As with any medicine, there is a very remote chance of a vaccine causing a serious injury or death. The safety of vaccines is always being monitored. For more information, visit: http://www.aguilar.org/ 5. What if there is a serious problem? What should I look for?  Look for anything that concerns you, such as signs of a severe allergic reaction, very high fever, or unusual behavior.  Signs of a severe allergic reaction can include hives, swelling of the face and throat, difficulty breathing, a fast heartbeat, dizziness, and weakness. These would usually start a few minutes to a few hours after the vaccination. What should I do?  If you think it is a severe allergic reaction or other emergency that can't wait, call 9-1-1 or get the person to the nearest hospital. Otherwise, call  your doctor.  Afterward, the reaction should be reported to the Vaccine Adverse Event Reporting System (VAERS). Your doctor might file this report, or you can do it yourself through the VAERS web site at www.vaers.SamedayNews.es, or by calling (409)341-4807. VAERS does not give medical advice.  6. The National Vaccine Injury Compensation Program The [employer] Vaccine Injury Compensation Program (VICP) is a federal program that was created to compensate people who may have been injured by certain vaccines. Persons who believe they may have been injured by a vaccine can learn about the program and about filing a claim by calling 864-783-8437 or visiting the [employer] website at GoldCloset.com.ee. There is a time limit to file a  claim for compensation. 7. How can I learn more?  Ask your doctor. He or she can give you the vaccine package insert or suggest other sources of information.  Call your local or state health department.  Contact the Centers for Disease Control and Prevention (CDC):  Call (779) 051-2907 (1-800-CDC-INFO) or  Visit CDC's website at http://hunter.com/ CDC Tdap Vaccine VIS (01/22/14)   This information is not intended to replace advice given to you by your health care provider. Make sure you discuss any questions you have with your health care provider.   Document Released: 05/16/2012 Document Revised: 12/06/2014 Document Reviewed: 02/27/2014 Elsevier Interactive Patient Education [employer].

## 2016-03-23 ENCOUNTER — Inpatient Hospital Stay (HOSPITAL_COMMUNITY)
Admission: AD | Admit: 2016-03-23 | Discharge: 2016-03-23 | Disposition: A | Discharge status: Home / Self Care | Payer: Medicaid Other | Source: Ambulatory Visit | Attending: Family Medicine | Admitting: Family Medicine

## 2016-03-23 ENCOUNTER — Encounter (HOSPITAL_COMMUNITY): Payer: Self-pay

## 2016-03-23 DIAGNOSIS — Z3A26 26 weeks gestation of pregnancy: Secondary | ICD-10-CM | POA: Insufficient documentation

## 2016-03-23 DIAGNOSIS — O9989 Other specified diseases and conditions complicating pregnancy, childbirth and the puerperium: Secondary | ICD-10-CM | POA: Diagnosis not present

## 2016-03-23 DIAGNOSIS — I4891 Unspecified atrial fibrillation: Secondary | ICD-10-CM | POA: Diagnosis not present

## 2016-03-23 DIAGNOSIS — K429 Umbilical hernia without obstruction or gangrene: Secondary | ICD-10-CM | POA: Diagnosis not present

## 2016-03-23 DIAGNOSIS — Z888 Allergy status to other drugs, medicaments and biological substances status: Secondary | ICD-10-CM | POA: Insufficient documentation

## 2016-03-23 DIAGNOSIS — O26892 Other specified pregnancy related conditions, second trimester: Secondary | ICD-10-CM | POA: Insufficient documentation

## 2016-03-23 DIAGNOSIS — R109 Unspecified abdominal pain: Secondary | ICD-10-CM | POA: Diagnosis present

## 2016-03-23 DIAGNOSIS — Z87891 Personal history of nicotine dependence: Secondary | ICD-10-CM | POA: Insufficient documentation

## 2016-03-23 LAB — URINALYSIS, ROUTINE W REFLEX MICROSCOPIC
BILIRUBIN URINE: NEGATIVE
GLUCOSE, UA: NEGATIVE mg/dL
Hgb urine dipstick: NEGATIVE
KETONES UR: NEGATIVE mg/dL
LEUKOCYTES UA: NEGATIVE
Nitrite: NEGATIVE
PH: 7 (ref 5.0–8.0)
PROTEIN: NEGATIVE mg/dL
Specific Gravity, Urine: <1.005 — ABNORMAL LOW (ref 1.005–1.030)

## 2016-03-23 NOTE — MAU Note (Signed)
Patient presents with c/o abdominal pain since this morning. Patient states she has felt a lump in her stomach for the past 2 days and she believes this is what is causing the pain.

## 2016-03-23 NOTE — Discharge Instructions (Signed)
Preterm Labor Information Preterm labor is when labor starts at less than 37 weeks of pregnancy. The normal length of a pregnancy is 39 to 41 weeks. CAUSES Often, there is no identifiable underlying cause as to why a woman goes into preterm labor. One of the most common known causes of preterm labor is infection. Infections of the uterus, cervix, vagina, amniotic sac, bladder, kidney, or even the lungs (pneumonia) can cause labor to start. Other suspected causes of preterm labor include:   Urogenital infections, such as yeast infections and bacterial vaginosis.   Uterine abnormalities (uterine shape, uterine septum, fibroids, or bleeding from the placenta).   A cervix that has been operated on (it may fail to stay closed).   Malformations in the fetus.   Multiple gestations (twins, triplets, and so on).   Breakage of the amniotic sac.  RISK FACTORS  Having a previous history of preterm labor.   Having premature rupture of membranes (PROM).   Having a placenta that covers the opening of the cervix (placenta previa).   Having a placenta that separates from the uterus (placental abruption).   Having a cervix that is too weak to hold the fetus in the uterus (incompetent cervix).   Having too much fluid in the amniotic sac (polyhydramnios).   Taking illegal drugs or smoking while pregnant.   Not gaining enough weight while pregnant.   Being younger than 62 and older than 24 years old.   Having a low socioeconomic status.   Being African American. SYMPTOMS Signs and symptoms of preterm labor include:   Menstrual-like cramps, abdominal pain, or back pain.  Uterine contractions that are regular, as frequent as six in an hour, regardless of their intensity (may be mild or painful).  Contractions that start on the top of the uterus and spread down to the lower abdomen and back.   A sense of increased pelvic pressure.   A watery or bloody mucus discharge that  comes from the vagina.  TREATMENT Depending on the length of the pregnancy and other circumstances, your health care provider may suggest bed rest. If necessary, there are medicines that can be given to stop contractions and to mature the fetal lungs. If labor happens before 34 weeks of pregnancy, a prolonged hospital stay may be recommended. Treatment depends on the condition of both you and the fetus.  WHAT SHOULD YOU DO IF YOU THINK YOU ARE IN PRETERM LABOR? Call your health care provider right away. You will need to go to the hospital to get checked immediately. HOW CAN YOU PREVENT PRETERM LABOR IN FUTURE PREGNANCIES? You should:   Stop smoking if you smoke.  Maintain healthy weight gain and avoid chemicals and drugs that are not necessary.  Be watchful for any type of infection.  Inform your health care provider if you have a known history of preterm labor.   This information is not intended to replace advice given to you by your health care provider. Make sure you discuss any questions you have with your health care provider.   Document Released: 02/05/2004 Document Revised: 07/18/2013 Document Reviewed: 12/18/2012 Elsevier Interactive Patient Education 2016 [place], Adult A hernia is the bulging of an organ or tissue through a weak spot in the muscles of the abdomen (abdominal wall). Hernias develop most often near the navel or groin. There are many kinds of hernias. Common kinds include:  Femoral hernia. This kind of hernia develops under the groin in the upper thigh area.  Inguinal  hernia. This kind of hernia develops in the groin or scrotum.  Umbilical hernia. This kind of hernia develops near the navel.  Hiatal hernia. This kind of hernia causes part of the stomach to be pushed up into the chest.  Incisional hernia. This kind of hernia bulges through a scar from an abdominal surgery. CAUSES This condition may be caused by:  Heavy lifting.  Coughing  over a long period of time.  Straining to have a bowel movement.  An incision made during an abdominal surgery.  A birth defect (congenital defect).  Excess weight or obesity.  Smoking.  Poor nutrition.  Cystic fibrosis.  Excess fluid in the abdomen.  Undescended testicles. SYMPTOMS Symptoms of a hernia include:  A lump on the abdomen. This is the first sign of a hernia. The lump may become more obvious with standing, straining, or coughing. It may get bigger over time if it is not treated or if the condition causing it is not treated.  Pain. A hernia is usually painless, but it may become painful over time if treatment is delayed. The pain is usually dull and may get worse with standing or lifting heavy objects. Sometimes a hernia gets tightly squeezed in the weak spot (strangulated) or stuck there (incarcerated) and causes additional symptoms. These symptoms may include:  Vomiting.  Nausea.  Constipation.  Irritability. DIAGNOSIS A hernia may be diagnosed with:  A physical exam. During the exam your health care provider may ask you to cough or to make a specific movement, because a hernia is usually more visible when you move.  Imaging tests. These can include:  X-rays.  Ultrasound.  CT scan. TREATMENT A hernia that is small and painless may not need to be treated. A hernia that is large or painful may be treated with surgery. Inguinal hernias may be treated with surgery to prevent incarceration or strangulation. Strangulated hernias are always treated with surgery, because lack of blood to the trapped organ or tissue can cause it to die. Surgery to treat a hernia involves pushing the bulge back into place and repairing the weak part of the abdomen. HOME CARE INSTRUCTIONS  Avoid straining.  Do not lift anything heavier than 10 lb (4.5 kg).  Lift with your leg muscles, not your back muscles. This helps avoid strain.  When coughing, try to cough  gently.  Prevent constipation. Constipation leads to straining with bowel movements, which can make a hernia worse or cause a hernia repair to break down. You can prevent constipation by:  Eating a high-fiber diet that includes plenty of fruits and vegetables.  Drinking enough fluids to keep your urine clear or pale yellow. Aim to drink 6-8 glasses of water per day.  Using a stool softener as directed by your health care provider.  Lose weight, if you are overweight.  Do not use any tobacco products, including cigarettes, chewing tobacco, or electronic cigarettes. If you need help quitting, ask your health care provider.  Keep all follow-up visits as directed by your health care provider. This is important. Your health care provider may need to monitor your condition. SEEK MEDICAL CARE IF:  You have swelling, redness, and pain in the affected area.  Your bowel habits change. SEEK IMMEDIATE MEDICAL CARE IF:  You have a fever.  You have abdominal pain that is getting worse.  You feel nauseous or you vomit.  You cannot push the hernia back in place by gently pressing on it while you are lying down.  The hernia:  Changes in shape or size.  Is stuck outside the abdomen.  Becomes discolored.  Feels hard or tender.   This information is not intended to replace advice given to you by your health care provider. Make sure you discuss any questions you have with your health care provider.   Document Released: 11/15/2005 Document Revised: 12/06/2014 Document Reviewed: 09/25/2014 Elsevier Interactive Patient Education [employer].

## 2016-03-23 NOTE — MAU Provider Note (Signed)
History     CSN: [id number]  Arrival date and time: 03/23/16 1650   First Provider Initiated Contact with Patient 03/23/16 1724       Chief Complaint  Patient presents with  . Abdominal Pain   HPI Nichole Bowers is a 25 y.o. G1P0 at [redacted]w[redacted]d who presents with abdominal pain that started 3 days ago and worsened today. Reports pain as aching & constant. Worse when she changes positions. Pain is in the mid to upper abdominal area. States she has a "lump" in her abdomen that has been there since prior to pregnancy but has grown in the last 3 days. States that is where the pain is. Rates pain as 8/10. Has not treated. Denies lower abdominal pain/cramping/contractions. Denies vaginal bleeding or LOF. Denies n/v/d/constipation. Positive fetal movement.   OB History    Gravida Para Term Preterm AB TAB SAB Ectopic Multiple Living   1         0      Past Medical History  Diagnosis Date  . Atrial fibrillation ([place])   . Cocaine use     Positive drug screen on 11/03/15  . Marijuana use     Positive drug screen on 11/03/15    Past Surgical History  Procedure Laterality Date  . Wisdom tooth extraction      History reviewed. No pertinent family history.  Social History  Substance Use Topics  . Smoking status: Former Smoker    Types: Cigarettes    Quit date: 11/19/2015  . Smokeless tobacco: Never Used  . Alcohol Use: No    Allergies:  Allergies  Allergen Reactions  . Phenergan [Promethazine] Nausea And Vomiting    MADE VOMITING WORSE    Prescriptions prior to admission  Medication Sig Dispense Refill Last Dose  . docusate sodium (COLACE) 100 MG capsule Take 1 capsule (100 mg total) by mouth every 12 (twelve) hours. (Patient taking differently: Take 100 mg by mouth 2 (two) times daily as needed for mild constipation. ) 60 capsule 0 Taking  . ondansetron (ZOFRAN ODT) 8 MG disintegrating tablet Take 1 tablet (8 mg total) by mouth every 8 (eight) hours as needed for nausea or  vomiting. 20 tablet 0 Taking  . polyethylene glycol (MIRALAX) packet Take 17 g by mouth daily. (Patient taking differently: Take 17 g by mouth daily as needed for mild constipation. ) 14 each 0 Taking  . Prenatal Multivit-Min-Fe-FA (PRENATAL VITAMINS) 0.8 MG tablet Take 1 tablet by mouth daily. (Patient not taking: Reported on 03/09/2016) 30 tablet 1 Not Taking    Review of Systems  Constitutional: Negative.   Gastrointestinal: Positive for abdominal pain. Negative for nausea, vomiting, diarrhea and constipation.  Genitourinary: Negative.    Physical Exam   Blood pressure 128/68, pulse 91, temperature 98.5 F (36.9 C), temperature source Oral, resp. rate 18, last menstrual period 09/15/2015.  Physical Exam  Nursing note and vitals reviewed. Constitutional: She is oriented to person, place, and time. She appears well-developed and well-nourished. No distress.  HENT:  Head: Normocephalic and atraumatic.  Eyes: Conjunctivae are normal. Right eye exhibits no discharge. Left eye exhibits no discharge. No scleral icterus.  Neck: Normal range of motion.  Cardiovascular: Normal rate, regular rhythm and normal heart sounds.   No murmur heard. Respiratory: Effort normal and breath sounds normal. No respiratory distress. She has no wheezes.  GI: Soft. Bowel sounds are normal. A hernia is present.  Hernia palpated just superior to umbilicus. ~3 cm diameter. Reducible.   Neurological:  She is alert and oriented to person, place, and time.  Skin: Skin is warm and dry. She is not diaphoretic.  Psychiatric: She has a normal mood and affect. Her behavior is normal. Judgment and thought content normal.   Dilation: Closed Effacement (%): Thick Station: -3 Exam by:: Jorje Guild NP   Fetal Tracing:  Baseline: 135 Variability: moderate Accelerations: 10x10 Decelerations: few small variables  Toco: irregular UI   MAU Course  Procedures Results for orders placed or performed during the  hospital encounter of 03/23/16 (from the past 24 hour(s))  Urinalysis, Routine w reflex microscopic (not at [facility])     Status: Abnormal   Collection Time: 03/23/16  5:06 PM  Result Value Ref Range   Color, Urine YELLOW YELLOW   APPearance CLEAR CLEAR   Specific Gravity, Urine <1.005 (L) 1.005 - 1.030   pH 7.0 5.0 - 8.0   Glucose, UA NEGATIVE NEGATIVE mg/dL   Hgb urine dipstick NEGATIVE NEGATIVE   Bilirubin Urine NEGATIVE NEGATIVE   Ketones, ur NEGATIVE NEGATIVE mg/dL   Protein, ur NEGATIVE NEGATIVE mg/dL   Nitrite NEGATIVE NEGATIVE   Leukocytes, UA NEGATIVE NEGATIVE    MDM Category 1 tracing UI that spaced out with PO fluids Cervix closed No evidence of incarcerated hernia Pt in no apparent distress  Assessment and Plan  A: 1. Umbilical hernia without obstruction and without gangrene    P; Discharge home Avoid heavy lifting or straining Discussed reasons to return Keep f/u with ob Will set up amb ref for surgery  Jorje Guild 03/23/2016, 5:24 PM

## 2016-04-06 ENCOUNTER — Ambulatory Visit (INDEPENDENT_AMBULATORY_CARE_PROVIDER_SITE_OTHER): Payer: Medicaid Other | Admitting: Advanced Practice Midwife

## 2016-04-06 VITALS — BP 119/69 | HR 86 | Wt 223.0 lb

## 2016-04-06 DIAGNOSIS — F1490 Cocaine use, unspecified, uncomplicated: Secondary | ICD-10-CM

## 2016-04-06 DIAGNOSIS — F1290 Cannabis use, unspecified, uncomplicated: Secondary | ICD-10-CM

## 2016-04-06 DIAGNOSIS — Z3492 Encounter for supervision of normal pregnancy, unspecified, second trimester: Secondary | ICD-10-CM

## 2016-04-06 DIAGNOSIS — O09893 Supervision of other high risk pregnancies, third trimester: Secondary | ICD-10-CM

## 2016-04-06 DIAGNOSIS — K429 Umbilical hernia without obstruction or gangrene: Secondary | ICD-10-CM

## 2016-04-06 DIAGNOSIS — O360930 Maternal care for other rhesus isoimmunization, third trimester, not applicable or unspecified: Secondary | ICD-10-CM

## 2016-04-06 DIAGNOSIS — O99323 Drug use complicating pregnancy, third trimester: Secondary | ICD-10-CM

## 2016-04-06 LAB — POCT URINALYSIS DIP (DEVICE)
BILIRUBIN URINE: NEGATIVE
GLUCOSE, UA: NEGATIVE mg/dL
HGB URINE DIPSTICK: NEGATIVE
Ketones, ur: NEGATIVE mg/dL
NITRITE: NEGATIVE
Protein, ur: NEGATIVE mg/dL
Specific Gravity, Urine: 1.015 (ref 1.005–1.030)
UROBILINOGEN UA: 0.2 mg/dL (ref 0.0–1.0)
pH: 6 (ref 5.0–8.0)

## 2016-04-06 LAB — CBC
HCT: 32 % — ABNORMAL LOW (ref 35.0–45.0)
HEMOGLOBIN: 10.5 g/dL — AB (ref 11.7–15.5)
MCH: 28 pg (ref 27.0–33.0)
MCHC: 32.8 g/dL (ref 32.0–36.0)
MCV: 85.3 fL (ref 80.0–100.0)
MPV: 10.5 fL (ref 7.5–12.5)
PLATELETS: 249 10*3/uL (ref 140–400)
RBC: 3.75 MIL/uL — AB (ref 3.80–5.10)
RDW: 13.8 % (ref 11.0–15.0)
WBC: 6.9 10*3/uL (ref 3.8–10.8)

## 2016-04-06 MED ORDER — RHO D IMMUNE GLOBULIN 1500 UNIT/2ML IJ SOSY
300.0000 ug | PREFILLED_SYRINGE | Freq: Once | INTRAMUSCULAR | Status: AC
  Administered 2016-04-06: 300 ug via INTRAMUSCULAR

## 2016-04-06 MED ORDER — TETANUS-DIPHTH-ACELL PERTUSSIS 5-2.5-18.5 LF-MCG/0.5 IM SUSP
0.5000 mL | Freq: Once | INTRAMUSCULAR | Status: AC
  Administered 2016-04-06: 0.5 mL via INTRAMUSCULAR

## 2016-04-06 MED ORDER — PRENATAL VITAMINS 0.8 MG PO TABS: 1.0000 | ORAL_TABLET | Freq: Every day | ORAL | Status: DC

## 2016-04-06 NOTE — Progress Notes (Signed)
Subjective:  Nichole Bowers is a 25 y.o. G1P0 at [redacted]w[redacted]d being seen today for ongoing prenatal care.  She is currently monitored for the following issues for this high-risk pregnancy and has Rh negative state in antepartum period; UTI in pregnancy; Cocaine abuse complicating pregnancy; Marijuana use; and Supervision of other high risk pregnancy, antepartum on her problem list.  Patient reports Abdominal pain at umblicus r/t hernia.  Contractions: Not present. Vag. Bleeding: None.  Movement: Present. Denies leaking of fluid.   The following portions of the patient's history were reviewed and updated as appropriate: allergies, current medications, past family history, past medical history, past social history, past surgical history and problem list. Problem list updated.  Objective:   Filed Vitals:   04/06/16 1438  BP: 119/69  Pulse: 86  Weight: 223 lb (101.152 kg)    Fetal Status: Fetal Heart Rate (bpm): 145 Fundal Height: 30 cm Movement: Present     General:  Alert, oriented and cooperative. Patient is in no acute distress.  Skin: Skin is warm and dry. No rash noted.   Cardiovascular: Normal heart rate noted  Respiratory: Normal respiratory effort, no problems with respiration noted  Abdomen: Soft, gravid, appropriate for gestational age. Pain/Pressure: Absent     Pelvic: Vag. Bleeding: None     Cervical exam deferred        Extremities: Normal range of motion.  Edema: None  Mental Status: Normal mood and affect. Normal behavior. Normal judgment and thought content.   Urinalysis:      Assessment and Plan:  Pregnancy: G1P0 at [redacted]w[redacted]d  1. Supervision of normal pregnancy, second trimester  - Prenatal Multivit-Min-Fe-FA (PRENATAL VITAMINS) 0.8 MG tablet; Take 1 tablet by mouth daily.  Dispense: 30 tablet; Refill: 12 - Glucose Tolerance, 1 HR (50g) w/o Fasting - CBC - RPR - HIV antibody (with reflex) - Tdap (BOOSTRIX) injection 0.5 mL; Inject 0.5 mLs into the muscle once. - rho (d)  immune globulin (RHIG/RHOPHYLAC) injection 300 mcg; Inject 2 mLs (300 mcg total) into the muscle once.  2. Supervision of other high risk pregnancy, antepartum, third trimester  3. Umbilical hernia without obstruction or gangrene --Abdomen soft, no evidence of incarceration.  Pt managing pain well at this time.  Plan for referral to general surgery postpartum.  Preterm labor symptoms and general obstetric precautions including but not limited to vaginal bleeding, contractions, leaking of fluid and fetal movement were reviewed in detail with the patient. Please refer to After Visit Summary for other counseling recommendations.  Return in about 2 weeks (around 04/20/2016).   Elvera Maria, CNM

## 2016-04-06 NOTE — Progress Notes (Signed)
Pt has hernia that is causing her some pain.

## 2016-04-07 LAB — GLUCOSE TOLERANCE, 1 HOUR (50G) W/O FASTING: GLUCOSE, 1 HR, GESTATIONAL: 122 mg/dL (ref <140)

## 2016-04-07 LAB — HIV ANTIBODY (ROUTINE TESTING W REFLEX): HIV: NONREACTIVE

## 2016-04-10 LAB — RPR

## 2016-04-20 ENCOUNTER — Encounter: Payer: Medicaid Other | Admitting: Certified Nurse Midwife

## 2016-05-05 ENCOUNTER — Ambulatory Visit (INDEPENDENT_AMBULATORY_CARE_PROVIDER_SITE_OTHER): Payer: Medicaid Other | Admitting: Advanced Practice Midwife

## 2016-05-05 VITALS — BP 121/79 | HR 97 | Wt 230.0 lb

## 2016-05-05 DIAGNOSIS — O09893 Supervision of other high risk pregnancies, third trimester: Secondary | ICD-10-CM | POA: Diagnosis present

## 2016-05-05 DIAGNOSIS — K439 Ventral hernia without obstruction or gangrene: Secondary | ICD-10-CM

## 2016-05-05 LAB — POCT URINALYSIS DIP (DEVICE)
BILIRUBIN URINE: NEGATIVE
GLUCOSE, UA: NEGATIVE mg/dL
Hgb urine dipstick: NEGATIVE
KETONES UR: NEGATIVE mg/dL
NITRITE: NEGATIVE
Protein, ur: NEGATIVE mg/dL
Specific Gravity, Urine: 1.01 (ref 1.005–1.030)
Urobilinogen, UA: 0.2 mg/dL (ref 0.0–1.0)
pH: 6 (ref 5.0–8.0)

## 2016-05-05 NOTE — Progress Notes (Signed)
Subjective:  Nichole Bowers is a 25 y.o. G1P0 at [redacted]w[redacted]d being seen today for ongoing prenatal care.  She is currently monitored for the following issues for this low-risk pregnancy and has Rh negative state in antepartum period; UTI in pregnancy; Cocaine abuse complicating pregnancy; Marijuana use; and Supervision of other high risk pregnancy, antepartum on her problem list.  Patient reports occasional contractions, discomfort at site of ventral hernia. No N/V, constant pain or constant bulging. Wants to know what can be done about her hernia. Contractions: Not present. Vag. Bleeding: None.  Movement: Present. Denies leaking of fluid.   The following portions of the patient's history were reviewed and updated as appropriate: allergies, current medications, past family history, past medical history, past social history, past surgical history and problem list. Problem list updated.  Objective:   Filed Vitals:   05/05/16 1002  BP: 121/79  Pulse: 97  Weight: 230 lb (104.327 kg)    Fetal Status: Fetal Heart Rate (bpm): 145   Movement: Present     General:  Alert, oriented and cooperative. Patient is in no acute distress.  Skin: Skin is warm and dry. No rash noted.   Cardiovascular: Normal heart rate noted  Respiratory: Normal respiratory effort, no problems with respiration noted  Abdomen: Soft, gravid, appropriate for gestational age. Pain/Pressure: Present at site of hernia. 4-8 cm non-tender bulge w/ valsalva.   Pelvic: Vag. Bleeding: None     Cervical exam deferred        Extremities: Normal range of motion.  Edema: Trace  Mental Status: Normal mood and affect. Normal behavior. Normal judgment and thought content.   Urinalysis: Urine Protein: Negative Urine Glucose: Negative  Assessment and Plan:  Pregnancy: G1P0 at [redacted]w[redacted]d  1. Supervision of other high risk pregnancy, antepartum, third trimester   2. Ventral hernia without obstruction or gangrene  - Ambulatory referral to  General Surgery - Hernia precautions.  Preterm labor symptoms and general obstetric precautions including but not limited to vaginal bleeding, contractions, leaking of fluid and fetal movement were reviewed in detail with the patient. Please refer to After Visit Summary for other counseling recommendations.  Return in about 2 weeks (around 05/19/2016).   [name], CNM

## 2016-05-05 NOTE — Progress Notes (Signed)
Educated pt on [place]  Referral to [facility] scheduled for June 15th @ 1550. Pt notified. Contact Information also given to pt.

## 2016-05-05 NOTE — Patient Instructions (Addendum)
[facility] [phone]  [facility] Contractions Contractions of the uterus can occur throughout pregnancy. Contractions are not always a sign that you are in labor.  WHAT ARE BRAXTON HICKS CONTRACTIONS?  Contractions that occur before labor are called Braxton Hicks contractions, or false labor. Toward the end of pregnancy (32-34 weeks), these contractions can develop more often and may become more forceful. This is not true labor because these contractions do not result in opening (dilatation) and thinning of the cervix. They are sometimes difficult to tell apart from true labor because these contractions can be forceful and people have different pain tolerances. You should not feel embarrassed if you go to the hospital with false labor. Sometimes, the only way to tell if you are in true labor is for your health care provider to look for changes in the cervix. If there are no prenatal problems or other health problems associated with the pregnancy, it is completely safe to be sent home with false labor and await the onset of true labor. HOW CAN YOU TELL THE DIFFERENCE BETWEEN TRUE AND FALSE LABOR? False Labor  The contractions of false labor are usually shorter and not as hard as those of true labor.   The contractions are usually irregular.   The contractions are often felt in the front of the lower abdomen and in the groin.   The contractions may go away when you walk around or change positions while lying down.   The contractions get weaker and are shorter lasting as time goes on.   The contractions do not usually become progressively stronger, regular, and closer together as with true labor.  True Labor  Contractions in true labor last 30-70 seconds, become very regular, usually become more intense, and increase in frequency.   The contractions do not go away with walking.   The discomfort is usually felt in the top of the uterus and spreads to the lower  abdomen and low back.   True labor can be determined by your health care provider with an exam. This will show that the cervix is dilating and getting thinner.  WHAT TO REMEMBER  Keep up with your usual exercises and follow other instructions given by your health care provider.   Take medicines as directed by your health care provider.   Keep your regular prenatal appointments.   Eat and drink lightly if you think you are going into labor.   If Braxton Hicks contractions are making you uncomfortable:   Change your position from lying down or resting to walking, or from walking to resting.   Sit and rest in a tub of warm water.   Drink 2-3 glasses of water. Dehydration may cause these contractions.   Do slow and deep breathing several times an hour.  WHEN SHOULD I SEEK IMMEDIATE MEDICAL CARE? Seek immediate medical care if:  Your contractions become stronger, more regular, and closer together.   You have fluid leaking or gushing from your vagina.   You have a fever.   You pass blood-tinged mucus.   You have vaginal bleeding.   You have continuous abdominal pain.   You have low back pain that you never had before.   You feel your baby's head pushing down and causing pelvic pressure.   Your baby is not moving as much as it used to.    This information is not intended to replace advice given to you by your health care provider. Make sure you discuss any questions you  have with your health care provider.   Document Released: 11/15/2005 Document Revised: 11/20/2013 Document Reviewed: 08/27/2013 Elsevier Interactive Patient Education 2016 Elsevier Inc[employer]  Ventral Hernia A ventral hernia (also called an incisional hernia) is a hernia that occurs at the site of a previous surgical cut (incision) in the abdomen. The abdominal wall spans from your lower chest down to your pelvis. If the abdominal wall is weakened from a surgical incision, a hernia can occur. A  hernia is a bulge of bowel or muscle tissue pushing out on the weakened part of the abdominal wall. Ventral hernias can get bigger from straining or lifting. Obese and older people are at higher risk for a ventral hernia. People who develop infections after surgery or require repeat incisions at the same site on the abdomen are also at increased risk. CAUSES  A ventral hernia occurs because of weakness in the abdominal wall at an incision site.  SYMPTOMS  Common symptoms include:  A visible bulge or lump on the abdominal wall.  Pain or tenderness around the lump.  Increased discomfort if you cough or make a sudden movement. If the hernia has blocked part of the intestine, a serious complication can occur (incarcerated or strangulated hernia). This can become a problem that requires emergency surgery because the blood flow to the blocked intestine may be cut off. Symptoms may include:  Feeling sick to your stomach (nauseous).  Throwing up (vomiting).  Stomach swelling (distention) or bloating.  Fever.  Rapid heartbeat. DIAGNOSIS  Your health care provider will take a medical history and perform a physical exam. Various tests may be ordered, such as:  Blood tests.  Urine tests.  Ultrasonography.  X-rays.  Computed tomography (CT). TREATMENT  Watchful waiting may be all that is needed for a smaller hernia that does not cause symptoms. Your health care provider may recommend the use of a supportive belt (truss) that helps to keep the abdominal wall intact. For larger hernias or those that cause pain, surgery to repair the hernia is usually recommended. If a hernia becomes strangulated, emergency surgery needs to be done right away. HOME CARE INSTRUCTIONS  Avoid putting pressure or strain on the abdominal area.  Avoid heavy lifting.  Use good body positioning for physical tasks. Ask your health care provider about proper body positioning.  Use a supportive belt as directed by  your health care provider.  Maintain a healthy weight.  Eat foods that are high in fiber, such as whole grains, fruits, and vegetables. Fiber helps prevent difficult bowel movements (constipation).  Drink enough fluids to keep your urine clear or pale yellow.  Follow up with your health care provider as directed. SEEK MEDICAL CARE IF:   Your hernia seems to be getting larger or more painful. SEEK IMMEDIATE MEDICAL CARE IF:   You have abdominal pain that is sudden and sharp.  Your pain becomes severe.  You have repeated vomiting.  You are sweating a lot.  You notice a rapid heartbeat.  You develop a fever. MAKE SURE YOU:   Understand these instructions.  Will watch your condition.  Will get help right away if you are not doing well or get worse.   This information is not intended to replace advice given to you by your health care provider. Make sure you discuss any questions you have with your health care provider.   Document Released: 11/01/2012 Document Revised: 12/06/2014 Document Reviewed: 11/01/2012 Elsevier Interactive Patient Education [employer].

## 2016-05-19 ENCOUNTER — Ambulatory Visit (INDEPENDENT_AMBULATORY_CARE_PROVIDER_SITE_OTHER): Payer: Medicaid Other | Admitting: Advanced Practice Midwife

## 2016-05-19 VITALS — BP 129/77 | HR 90 | Wt 232.6 lb

## 2016-05-19 DIAGNOSIS — R109 Unspecified abdominal pain: Secondary | ICD-10-CM

## 2016-05-19 DIAGNOSIS — G5600 Carpal tunnel syndrome, unspecified upper limb: Secondary | ICD-10-CM

## 2016-05-19 DIAGNOSIS — O26843 Uterine size-date discrepancy, third trimester: Secondary | ICD-10-CM

## 2016-05-19 DIAGNOSIS — O99321 Drug use complicating pregnancy, first trimester: Secondary | ICD-10-CM

## 2016-05-19 DIAGNOSIS — O99323 Drug use complicating pregnancy, third trimester: Secondary | ICD-10-CM | POA: Diagnosis not present

## 2016-05-19 DIAGNOSIS — O26899 Other specified pregnancy related conditions, unspecified trimester: Secondary | ICD-10-CM

## 2016-05-19 DIAGNOSIS — O99350 Diseases of the nervous system complicating pregnancy, unspecified trimester: Secondary | ICD-10-CM

## 2016-05-19 DIAGNOSIS — O9989 Other specified diseases and conditions complicating pregnancy, childbirth and the puerperium: Secondary | ICD-10-CM | POA: Diagnosis not present

## 2016-05-19 DIAGNOSIS — F1410 Cocaine abuse, uncomplicated: Secondary | ICD-10-CM

## 2016-05-19 DIAGNOSIS — O09893 Supervision of other high risk pregnancies, third trimester: Secondary | ICD-10-CM

## 2016-05-19 LAB — POCT URINALYSIS DIP (DEVICE)
Bilirubin Urine: NEGATIVE
Glucose, UA: NEGATIVE mg/dL
HGB URINE DIPSTICK: NEGATIVE
Ketones, ur: NEGATIVE mg/dL
NITRITE: NEGATIVE
PH: 6.5 (ref 5.0–8.0)
PROTEIN: NEGATIVE mg/dL
Specific Gravity, Urine: 1.015 (ref 1.005–1.030)
UROBILINOGEN UA: 0.2 mg/dL (ref 0.0–1.0)

## 2016-05-19 MED ORDER — SPLINT WRIST BRACE/LEFT-RIGHT MISC: 1.0000 | Freq: Every day | Status: DC

## 2016-05-19 NOTE — Progress Notes (Signed)
Subjective:  Nichole Bowers is a 25 y.o. G1P0 at [redacted]w[redacted]d being seen today for ongoing prenatal care.  She is currently monitored for the following issues for this high-risk pregnancy and has Rh negative state in antepartum period; UTI in pregnancy; Cocaine abuse complicating pregnancy; Marijuana use; Supervision of other high risk pregnancy, antepartum; and Ventral hernia without obstruction or gangrene on her problem list.  Patient reports cramping/contractions 3 days ago, then starting again while in the office this morning, reports cramps 5x/hour.  Contractions: Irritability. Vag. Bleeding: None.  Movement: Present. Denies leaking of fluid.   The following portions of the patient's history were reviewed and updated as appropriate: allergies, current medications, past family history, past medical history, past social history, past surgical history and problem list. Problem list updated.  Objective:   Filed Vitals:   05/19/16 0819  BP: 129/77  Pulse: 90  Weight: 232 lb 9.6 oz (105.507 kg)    Fetal Status: Fetal Heart Rate (bpm): 135 Fundal Height: 37 cm Movement: Present     General:  Alert, oriented and cooperative. Patient is in no acute distress.  Skin: Skin is warm and dry. No rash noted.   Cardiovascular: Normal heart rate noted  Respiratory: Normal respiratory effort, no problems with respiration noted  Abdomen: Soft, gravid, appropriate for gestational age. Pain/Pressure: Present     Pelvic: Cervical exam performed        Extremities: Normal range of motion.  Edema: Trace  Mental Status: Normal mood and affect. Normal behavior. Normal judgment and thought content.   Urinalysis: Urine Protein: Negative Urine Glucose: Negative  Assessment and Plan:  Pregnancy: G1P0 at [redacted]w[redacted]d  1. Supervision of other high risk pregnancy, antepartum, third trimester  - POCT urinalysis dip (device)  2. Uterine size date discrepancy pregnancy, third trimester --Size greater than dates x 2  visits, 3 cm ahead today - [place] [facility]FM OB FOLLOW UP; Future  3. Pregnancy related carpal tunnel syndrome, antepartum  - Elastic Bandages & Supports (SPLINT WRIST BRACE/LEFT-RIGHT) MISC; 1 Device by Does not apply route daily.  Dispense: 1 each; Refill: 0  4. Cocaine abuse complicating pregnancy, first trimester --Positive at initial OB, neg for cocaine in second trimester but positive for marijuana - [id number] PDM PROFILE  5. Abdominal pain affecting pregnancy, antepartum --Cervix closed, preterm labor signs reviewed - Culture, OB Urine  Preterm labor symptoms and general obstetric precautions including but not limited to vaginal bleeding, contractions, leaking of fluid and fetal movement were reviewed in detail with the patient. Please refer to After Visit Summary for other counseling recommendations.  Return in about 2 weeks (around 06/02/2016).   Elvera Maria, CNM

## 2016-05-19 NOTE — Progress Notes (Signed)
Pt reports contractions on Sunday and Monday since has went away Educated pt on Benefits of Breastfeeding for [employer]

## 2016-05-20 LAB — CULTURE, OB URINE

## 2016-05-24 LAB — CP5000051 PDM PROFILE
AMPHETAMINES: NEGATIVE ng/mL (ref <500)
BARBITURATES: NEGATIVE ng/mL (ref <300)
Benzodiazepines: NEGATIVE ng/mL (ref <100)
Buprenorphine: NEGATIVE ng/mL (ref <5)
COCAINE METABOLITE: NEGATIVE ng/mL (ref <150)
Desmethyltramadol: NEGATIVE ng/mL (ref <100)
FENTANYL: NEGATIVE ng/mL (ref <0.5)
MARIJUANA METABOLITE: NEGATIVE ng/mL (ref <20)
MDMA: NEGATIVE ng/mL (ref <500)
MEPROBAMATE: NEGATIVE ng/mL (ref <1000)
METHADONE METABOLITE: NEGATIVE ng/mL (ref <100)
Meperidine: NEGATIVE ng/mL (ref <100)
NORFENTANYL: NEGATIVE ng/mL (ref <0.5)
NORTAPENTADOL: NEGATIVE ng/mL (ref <50)
Normeperidine: NEGATIVE ng/mL (ref <100)
OPIATES: NEGATIVE ng/mL (ref <100)
Oxycodone: NEGATIVE ng/mL (ref <100)
PROPOXYPHENE: NEGATIVE ng/mL (ref <300)
Phencyclidine: NEGATIVE ng/mL (ref <25)
TAPENTADOL: NEGATIVE ng/mL (ref <50)
Tramadol: NEGATIVE ng/mL (ref <100)
ZOLPIDEM METABOLITE: NEGATIVE ng/mL (ref <5)
ZOLPIDEM: NEGATIVE ng/mL (ref <5)

## 2016-05-26 ENCOUNTER — Ambulatory Visit (HOSPITAL_COMMUNITY)
Admission: RE | Admit: 2016-05-26 | Discharge: 2016-05-26 | Disposition: A | Discharge status: Home / Self Care | Payer: Medicaid Other | Source: Ambulatory Visit | Attending: Advanced Practice Midwife | Admitting: Advanced Practice Midwife

## 2016-05-26 ENCOUNTER — Encounter (HOSPITAL_COMMUNITY): Payer: Self-pay

## 2016-05-26 VITALS — BP 117/71 | HR 99 | Wt 231.8 lb

## 2016-05-26 DIAGNOSIS — O360130 Maternal care for anti-D [Rh] antibodies, third trimester, not applicable or unspecified: Secondary | ICD-10-CM | POA: Insufficient documentation

## 2016-05-26 DIAGNOSIS — O99213 Obesity complicating pregnancy, third trimester: Secondary | ICD-10-CM | POA: Diagnosis not present

## 2016-05-26 DIAGNOSIS — O99323 Drug use complicating pregnancy, third trimester: Secondary | ICD-10-CM | POA: Diagnosis not present

## 2016-05-26 DIAGNOSIS — Z3A35 35 weeks gestation of pregnancy: Secondary | ICD-10-CM | POA: Diagnosis not present

## 2016-05-26 DIAGNOSIS — O26843 Uterine size-date discrepancy, third trimester: Secondary | ICD-10-CM | POA: Diagnosis not present

## 2016-05-26 DIAGNOSIS — F1410 Cocaine abuse, uncomplicated: Secondary | ICD-10-CM

## 2016-05-26 DIAGNOSIS — O99321 Drug use complicating pregnancy, first trimester: Secondary | ICD-10-CM

## 2016-05-31 DIAGNOSIS — O26843 Uterine size-date discrepancy, third trimester: Secondary | ICD-10-CM | POA: Insufficient documentation

## 2016-06-04 ENCOUNTER — Encounter: Payer: Self-pay | Admitting: *Deleted

## 2016-06-08 ENCOUNTER — Ambulatory Visit (INDEPENDENT_AMBULATORY_CARE_PROVIDER_SITE_OTHER): Payer: Medicaid Other | Admitting: Advanced Practice Midwife

## 2016-06-08 ENCOUNTER — Encounter: Payer: Self-pay | Admitting: Advanced Practice Midwife

## 2016-06-08 ENCOUNTER — Other Ambulatory Visit (HOSPITAL_COMMUNITY)
Admission: RE | Admit: 2016-06-08 | Discharge: 2016-06-08 | Disposition: A | Discharge status: Home / Self Care | Payer: Medicaid Other | Source: Ambulatory Visit | Attending: Advanced Practice Midwife | Admitting: Advanced Practice Midwife

## 2016-06-08 ENCOUNTER — Telehealth: Payer: Self-pay | Admitting: *Deleted

## 2016-06-08 VITALS — BP 104/58 | HR 107 | Temp 98.5°F | Wt 234.5 lb

## 2016-06-08 DIAGNOSIS — O26893 Other specified pregnancy related conditions, third trimester: Secondary | ICD-10-CM

## 2016-06-08 DIAGNOSIS — R002 Palpitations: Secondary | ICD-10-CM | POA: Diagnosis not present

## 2016-06-08 DIAGNOSIS — Z113 Encounter for screening for infections with a predominantly sexual mode of transmission: Secondary | ICD-10-CM

## 2016-06-08 DIAGNOSIS — O99323 Drug use complicating pregnancy, third trimester: Secondary | ICD-10-CM

## 2016-06-08 DIAGNOSIS — R51 Headache: Secondary | ICD-10-CM | POA: Diagnosis not present

## 2016-06-08 DIAGNOSIS — F1410 Cocaine abuse, uncomplicated: Secondary | ICD-10-CM | POA: Diagnosis not present

## 2016-06-08 DIAGNOSIS — Z3493 Encounter for supervision of normal pregnancy, unspecified, third trimester: Secondary | ICD-10-CM

## 2016-06-08 DIAGNOSIS — D126 Benign neoplasm of colon, unspecified: Secondary | ICD-10-CM | POA: Diagnosis not present

## 2016-06-08 DIAGNOSIS — R519 Headache, unspecified: Secondary | ICD-10-CM

## 2016-06-08 LAB — COMPREHENSIVE METABOLIC PANEL
ALBUMIN: 3.2 g/dL — AB (ref 3.6–5.1)
ALK PHOS: 156 U/L — AB (ref 33–115)
ALT: 11 U/L (ref 6–29)
AST: 18 U/L (ref 10–30)
BUN: 5 mg/dL — AB (ref 7–25)
CALCIUM: 9 mg/dL (ref 8.6–10.2)
CO2: 24 mmol/L (ref 20–31)
CREATININE: 0.64 mg/dL (ref 0.50–1.10)
Chloride: 104 mmol/L (ref 98–110)
GLUCOSE: 94 mg/dL (ref 65–99)
POTASSIUM: 4.6 mmol/L (ref 3.5–5.3)
Sodium: 137 mmol/L (ref 135–146)
TOTAL PROTEIN: 5.9 g/dL — AB (ref 6.1–8.1)
Total Bilirubin: 0.3 mg/dL (ref 0.2–1.2)

## 2016-06-08 LAB — CBC
HEMATOCRIT: 33.3 % — AB (ref 35.0–45.0)
HEMOGLOBIN: 11.4 g/dL — AB (ref 11.7–15.5)
MCH: 28.2 pg (ref 27.0–33.0)
MCHC: 34.2 g/dL (ref 32.0–36.0)
MCV: 82.4 fL (ref 80.0–100.0)
MPV: 10.5 fL (ref 7.5–12.5)
Platelets: 252 10*3/uL (ref 140–400)
RBC: 4.04 MIL/uL (ref 3.80–5.10)
RDW: 14.6 % (ref 11.0–15.0)
WBC: 6.4 10*3/uL (ref 3.8–10.8)

## 2016-06-08 LAB — OB RESULTS CONSOLE GBS: GBS: POSITIVE

## 2016-06-08 LAB — PROTEIN / CREATININE RATIO, URINE
CREATININE, URINE: 98 mg/dL (ref 20–320)
Protein Creatinine Ratio: 163 mg/g creat — ABNORMAL HIGH (ref 21–161)
Total Protein, Urine: 16 mg/dL (ref 5–24)

## 2016-06-08 LAB — POCT URINALYSIS DIP (DEVICE)
Bilirubin Urine: NEGATIVE
GLUCOSE, UA: NEGATIVE mg/dL
Hgb urine dipstick: NEGATIVE
Ketones, ur: NEGATIVE mg/dL
NITRITE: NEGATIVE
PROTEIN: NEGATIVE mg/dL
Specific Gravity, Urine: 1.01 (ref 1.005–1.030)
UROBILINOGEN UA: 0.2 mg/dL (ref 0.0–1.0)
pH: 6.5 (ref 5.0–8.0)

## 2016-06-08 MED ORDER — BUTALBITAL-APAP-CAFFEINE 50-325-40 MG PO TABS: 1.0000 | ORAL_TABLET | Freq: Four times a day (QID) | ORAL | Status: DC | PRN

## 2016-06-08 NOTE — Telephone Encounter (Signed)
Per [place] pt results are normal and she can take Fioricet as prescribed for headaches. Called patient and phone just continues to ring.

## 2016-06-08 NOTE — Telephone Encounter (Signed)
Pt called requesting her results. [place] to review and then will call patient back.

## 2016-06-08 NOTE — Patient Instructions (Signed)
General Headache Without Cause A headache is pain or discomfort felt around the head or neck area. The specific cause of a headache may not be found. There are many causes and types of headaches. A few common ones are:  Tension headaches.  Migraine headaches.  Cluster headaches.  Chronic daily headaches. HOME CARE INSTRUCTIONS  Watch your condition for any changes. Take these steps to help with your condition: Managing Pain  Take over-the-counter and prescription medicines only as told by your health care provider.  Lie down in a dark, quiet room when you have a headache.  If directed, apply ice to the head and neck area:  Put ice in a plastic bag.  Place a towel between your skin and the bag.  Leave the ice on for 20 minutes, 2-3 times per day.  Use a heating pad or hot shower to apply heat to the head and neck area as told by your health care provider.  Keep lights dim if bright lights bother you or make your headaches worse. Eating and Drinking  Eat meals on a regular schedule.  Limit alcohol use.  Decrease the amount of caffeine you drink, or stop drinking caffeine. General Instructions  Keep all follow-up visits as told by your health care provider. This is important.  Keep a headache journal to help find out what may trigger your headaches. For example, write down:  What you eat and drink.  How much sleep you get.  Any change to your diet or medicines.  Try massage or other relaxation techniques.  Limit stress.  Sit up straight, and do not tense your muscles.  Do not use tobacco products, including cigarettes, chewing tobacco, or e-cigarettes. If you need help quitting, ask your health care provider.  Exercise regularly as told by your health care provider.  Sleep on a regular schedule. Get 7-9 hours of sleep, or the amount recommended by your health care provider. SEEK MEDICAL CARE IF:   Your symptoms are not helped by medicine.  You have a  headache that is different from the usual headache.  You have nausea or you vomit.  You have a fever. SEEK IMMEDIATE MEDICAL CARE IF:   Your headache becomes severe.  You have repeated vomiting.  You have a stiff neck.  You have a loss of vision.  You have problems with speech.  You have pain in the eye or ear.  You have muscular weakness or loss of muscle control.  You lose your balance or have trouble walking.  You feel faint or pass out.  You have confusion.   This information is not intended to replace advice given to you by your health care provider. Make sure you discuss any questions you have with your health care provider.   Document Released: 11/15/2005 Document Revised: 08/06/2015 Document Reviewed: 03/10/2015 Elsevier Interactive Patient Education 2016 [employer].  Hypertension During Pregnancy Hypertension, or high blood pressure, is when there is extra pressure inside your blood vessels that carry blood from the heart to the rest of your body (arteries). It can happen at any time in life, including pregnancy. Hypertension during pregnancy can cause problems for you and your baby. Your baby might not weigh as much as he or she should at birth or might be born early (premature). Very bad cases of hypertension during pregnancy can be life-threatening.  Different types of hypertension can occur during pregnancy. These include:  Chronic hypertension. This happens when a woman has hypertension before pregnancy and it  continues during pregnancy.  Gestational hypertension. This is when hypertension develops during pregnancy.  Preeclampsia or toxemia of pregnancy. This is a very serious type of hypertension that develops only during pregnancy. It affects the whole body and can be very dangerous for both mother and baby.  Gestational hypertension and preeclampsia usually go away after your baby is born. Your blood pressure will likely stabilize within 6 weeks. Women  who have hypertension during pregnancy have a greater chance of developing hypertension later in life or with future pregnancies. RISK FACTORS There are certain factors that make it more likely for you to develop hypertension during pregnancy. These include:  Having hypertension before pregnancy.  Having hypertension during a previous pregnancy.  Being overweight.  Being older than 40 years.  Being pregnant with more than one baby.  Having diabetes or kidney problems. SIGNS AND SYMPTOMS Chronic and gestational hypertension rarely cause symptoms. Preeclampsia has symptoms, which may include:  Increased protein in your urine. Your health care provider will check for this at every prenatal visit.  Swelling of your hands and face.  Rapid weight gain.  Headaches.  Visual changes.  Being bothered by light.  Abdominal pain, especially in the upper right area.  Chest pain.  Shortness of breath.  Increased reflexes.  Seizures. These occur with a more severe form of preeclampsia, called eclampsia. DIAGNOSIS  You may be diagnosed with hypertension during a regular prenatal exam. At each prenatal visit, you may have:  Your blood pressure checked.  A urine test to check for protein in your urine. The type of hypertension you are diagnosed with depends on when you developed it. It also depends on your specific blood pressure reading.  Developing hypertension before 20 weeks of pregnancy is consistent with chronic hypertension.  Developing hypertension after 20 weeks of pregnancy is consistent with gestational hypertension.  Hypertension with increased urinary protein is diagnosed as preeclampsia.  Blood pressure measurements that stay above 0000000 systolic or [id number] diastolic are a sign of severe preeclampsia. TREATMENT Treatment for hypertension during pregnancy varies. Treatment depends on the type of hypertension and how serious it is.  If you take medicine for chronic  hypertension, you may need to switch medicines.  Medicines called ACE inhibitors should not be taken during pregnancy.  Low-dose aspirin may be suggested for women who have risk factors for preeclampsia.  If you have gestational hypertension, you may need to take a blood pressure medicine that is safe during pregnancy. Your health care provider will recommend the correct medicine.  If you have severe preeclampsia, you may need to be in the hospital. Health care providers will watch you and your baby very closely. You also may need to take medicine called magnesium sulfate to prevent seizures and lower blood pressure.  Sometimes, an early delivery is needed. This may be the case if the condition worsens. It would be done to protect you and your baby. The only cure for preeclampsia is delivery.  Your health care provider may recommend that you take one low-dose aspirin (81 mg) each day to help prevent high blood pressure during your pregnancy if you are at risk for preeclampsia. You may be at risk for preeclampsia if:  You had preeclampsia or eclampsia during a previous pregnancy.  Your baby did not grow as expected during a previous pregnancy.  You experienced preterm birth with a previous pregnancy.  You experienced a separation of the placenta from the uterus (placental abruption) during a previous pregnancy.  You experienced  the loss of your baby during a previous pregnancy.  You are pregnant with more than one baby.  You have other medical conditions, such as diabetes or an autoimmune disease. HOME CARE INSTRUCTIONS  Schedule and keep all of your regular prenatal care appointments. This is important.  Take medicines only as directed by your health care provider. Tell your health care provider about all medicines you take.  Eat as little salt as possible.  Get regular exercise.  Do not drink alcohol.  Do not use tobacco products.  Do not drink products with caffeine.  Lie  on your left side when resting. SEEK IMMEDIATE MEDICAL CARE IF:  You have severe abdominal pain.  You have sudden swelling in your hands, ankles, or face.  You gain 4 pounds (1.8 kg) or more in 1 week.  You vomit repeatedly.  You have vaginal bleeding.  You do not feel your baby moving as much.  You have a headache.  You have blurred or double vision.  You have muscle twitching or spasms.  You have shortness of breath.  You have blue fingernails or lips.  You have blood in your urine. MAKE SURE YOU:  Understand these instructions.  Will watch your condition.  Will get help right away if you are not doing well or get worse.   This information is not intended to replace advice given to you by your health care provider. Make sure you discuss any questions you have with your health care provider.   Document Released: 08/03/2011 Document Revised: 12/06/2014 Document Reviewed: 06/14/2013 Elsevier Interactive Patient Education [employer].

## 2016-06-08 NOTE — Progress Notes (Signed)
Subjective:  Nichole Bowers is a 25 y.o. G1P0 at 69w2dbeing seen today for ongoing prenatal care.  She is currently monitored for the following issues for this high-risk pregnancy and has Rh negative state in antepartum period; UTI in pregnancy; Cocaine abuse complicating pregnancy; Marijuana use; Supervision of other high risk pregnancy, antepartum; Ventral hernia without obstruction or gangrene; and Uterine size-date discrepancy in third trimester, antepartum on her problem list.  Patient reports headache and seeing spots, feeling heart race, mild dizziness.  Contractions: Irritability. Vag. Bleeding: None.  Movement: Present. Denies leaking of fluid.   The following portions of the patient's history were reviewed and updated as appropriate: allergies, current medications, past family history, past medical history, past social history, past surgical history and problem list. Problem list updated.  Objective:   Filed Vitals:   06/08/16 0832  BP: 104/58  Pulse: 107  Temp: 98.5 F (36.9 C)  Weight: 234 lb 8 oz (106.369 kg)    Fetal Status: Fetal Heart Rate (bpm): 136   Movement: Present  Presentation: Vertex  General:  Alert, oriented and cooperative. Patient is in no acute distress.  Skin: Skin is warm and dry. No rash noted.   Cardiovascular: Normal heart rate noted  Respiratory: Normal respiratory effort, no problems with respiration noted  Abdomen: Soft, gravid, appropriate for gestational age. Pain/Pressure: Present     Pelvic:  Cervical exam performed Dilation: Closed Effacement (%): Thick Station: Ballotable  Extremities: Normal range of motion.  Edema: Mild pitting, slight indentation  Mental Status: Normal mood and affect. Normal behavior. Normal judgment and thought content.   Urinalysis: Urine Protein: Negative Urine Glucose: Negative  Assessment and Plan:  Pregnancy: G1P0 at 353w2d1. Third trimester pregnancy  - Culture, beta strep (group b only) - GC[facility]lam[facility]  [facility]. Headache in pregnancy, antepartum, third trimester  - CBC - Comp Met (CMET) - Protein / creatinine ratio, urine - butalbital-acetaminophen-caffeine (FIORICET) 50-325-40 MG tablet; Take 1-2 tablets by mouth every 6 (six) hours as needed for headache.  Dispense: 20 tablet; Refill: 0  3. Cocaine abuse affecting pregnancy in third trimester  - POCT Urine Drug Screen  4. Heart palpitations  - POCT Urine Drug Screen  Term labor symptoms and general obstetric precautions including but not limited to vaginal bleeding, contractions, leaking of fluid and fetal movement were reviewed in detail with the patient. Please refer to After Visit Summary for other counseling recommendations.  Pre-E precautions HA precautions Return in about 1 week (around 06/15/2016).   ViManya SilvasCNM

## 2016-06-08 NOTE — Telephone Encounter (Signed)
Informed patient of results, she had no further questions.

## 2016-06-08 NOTE — Progress Notes (Signed)
Pt reports feeling heart race sometimes.  Pt c/o seeing dots and having severe headaches all day.

## 2016-06-09 LAB — GC/CHLAMYDIA PROBE AMP (~~LOC~~) NOT AT ARMC
CHLAMYDIA, DNA PROBE: NEGATIVE
Neisseria Gonorrhea: NEGATIVE

## 2016-06-10 LAB — CULTURE, BETA STREP (GROUP B ONLY)

## 2016-06-15 ENCOUNTER — Ambulatory Visit (INDEPENDENT_AMBULATORY_CARE_PROVIDER_SITE_OTHER): Payer: Medicaid Other | Admitting: Family

## 2016-06-15 VITALS — BP 108/58 | HR 84 | Wt 231.0 lb

## 2016-06-15 DIAGNOSIS — O09893 Supervision of other high risk pregnancies, third trimester: Secondary | ICD-10-CM

## 2016-06-15 LAB — POCT URINALYSIS DIP (DEVICE)
Bilirubin Urine: NEGATIVE
GLUCOSE, UA: NEGATIVE mg/dL
Hgb urine dipstick: NEGATIVE
KETONES UR: NEGATIVE mg/dL
Nitrite: NEGATIVE
PROTEIN: NEGATIVE mg/dL
SPECIFIC GRAVITY, URINE: 1.02 (ref 1.005–1.030)
Urobilinogen, UA: 0.2 mg/dL (ref 0.0–1.0)
pH: 7 (ref 5.0–8.0)

## 2016-06-15 NOTE — Progress Notes (Signed)
Subjective:  Nichole Bowers is a 25 y.o. G1P0 at [redacted]w[redacted]d being seen today for ongoing prenatal care.  She is currently monitored for the following issues for this high-risk pregnancy and has Rh negative state in antepartum period; UTI in pregnancy; Cocaine abuse complicating pregnancy; Marijuana use; Supervision of other high risk pregnancy, antepartum; Ventral hernia without obstruction or gangrene; and Uterine size-date discrepancy in third trimester, antepartum on her problem list.  Patient reports no complaints.  Contractions: Irritability. Vag. Bleeding: None.  Movement: Present. Denies leaking of fluid.   The following portions of the patient's history were reviewed and updated as appropriate: allergies, current medications, past family history, past medical history, past social history, past surgical history and problem list. Problem list updated.  Objective:   Filed Vitals:   06/15/16 0947  BP: 108/58  Pulse: 84  Weight: 231 lb (104.781 kg)    Fetal Status: Fetal Heart Rate (bpm): 141 Fundal Height: 39 cm Movement: Present  Presentation: Vertex  General:  Alert, oriented and cooperative. Patient is in no acute distress.  Skin: Skin is warm and dry. No rash noted.   Cardiovascular: Normal heart rate noted  Respiratory: Normal respiratory effort, no problems with respiration noted  Abdomen: Soft, gravid, appropriate for gestational age. Pain/Pressure: Present     Pelvic:  Cervical exam performed Dilation: Fingertip Effacement (%): Thick    Extremities: Normal range of motion.  Edema: Trace  Mental Status: Normal mood and affect. Normal behavior. Normal judgment and thought content.   Urinalysis: Urine Protein: Negative Urine Glucose: Negative  Assessment and Plan:  Pregnancy: G1P0 at [redacted]w[redacted]d  1. Supervision of other high risk pregnancy, antepartum, third trimester - Reviewed GBS results - Discussed signs of labor - Reviewed IOL policy at 41 wks  Term labor symptoms and  general obstetric precautions including but not limited to vaginal bleeding, contractions, leaking of fluid and fetal movement were reviewed in detail with the patient. Please refer to After Visit Summary for other counseling recommendations.  Return in about 1 week (around 06/22/2016).   Venia Carbon Michiel Cowboy, CNM

## 2016-06-21 ENCOUNTER — Encounter (HOSPITAL_COMMUNITY): Payer: Self-pay | Admitting: *Deleted

## 2016-06-21 ENCOUNTER — Inpatient Hospital Stay (HOSPITAL_COMMUNITY)
Admission: AD | Admit: 2016-06-21 | Discharge: 2016-06-21 | Disposition: A | Discharge status: Home / Self Care | Payer: Medicaid Other | Source: Ambulatory Visit | Attending: Family Medicine | Admitting: Family Medicine

## 2016-06-21 DIAGNOSIS — Z3403 Encounter for supervision of normal first pregnancy, third trimester: Secondary | ICD-10-CM | POA: Diagnosis present

## 2016-06-21 DIAGNOSIS — Z3A00 Weeks of gestation of pregnancy not specified: Secondary | ICD-10-CM | POA: Diagnosis not present

## 2016-06-21 DIAGNOSIS — O09893 Supervision of other high risk pregnancies, third trimester: Secondary | ICD-10-CM

## 2016-06-21 DIAGNOSIS — O479 False labor, unspecified: Secondary | ICD-10-CM

## 2016-06-21 DIAGNOSIS — O26843 Uterine size-date discrepancy, third trimester: Secondary | ICD-10-CM

## 2016-06-21 HISTORY — DX: Ventral hernia without obstruction or gangrene: K43.9

## 2016-06-21 HISTORY — DX: Urinary tract infection, site not specified: N39.0

## 2016-06-21 NOTE — MAU Note (Signed)
Having bad pains in privates, lots of pressure.  Cramping in pelvis, feels like she is about to come out. Hurts when she walks.  Some bleeding last night

## 2016-06-21 NOTE — Discharge Instructions (Signed)
Keep your scheduled appt for prenatal care. Call the office or provider on call with further concerns or return to MAU as needed.

## 2016-06-23 ENCOUNTER — Ambulatory Visit (INDEPENDENT_AMBULATORY_CARE_PROVIDER_SITE_OTHER): Payer: Medicaid Other | Admitting: Obstetrics and Gynecology

## 2016-06-23 VITALS — BP 132/85 | HR 105 | Wt 239.0 lb

## 2016-06-23 DIAGNOSIS — O09893 Supervision of other high risk pregnancies, third trimester: Secondary | ICD-10-CM

## 2016-06-23 LAB — POCT URINALYSIS DIP (DEVICE)
BILIRUBIN URINE: NEGATIVE
GLUCOSE, UA: NEGATIVE mg/dL
Hgb urine dipstick: NEGATIVE
KETONES UR: NEGATIVE mg/dL
Nitrite: NEGATIVE
PROTEIN: NEGATIVE mg/dL
Specific Gravity, Urine: 1.01 (ref 1.005–1.030)
Urobilinogen, UA: 0.2 mg/dL (ref 0.0–1.0)
pH: 6.5 (ref 5.0–8.0)

## 2016-06-23 NOTE — Patient Instructions (Signed)

## 2016-06-25 ENCOUNTER — Inpatient Hospital Stay (HOSPITAL_COMMUNITY): Payer: Medicaid Other | Admitting: Anesthesiology

## 2016-06-25 ENCOUNTER — Inpatient Hospital Stay (HOSPITAL_COMMUNITY)
Admission: AD | Admit: 2016-06-25 | Discharge: 2016-06-28 | DRG: 765 | Disposition: A | Discharge status: Home / Self Care | Payer: Medicaid Other | Source: Ambulatory Visit | Attending: Obstetrics & Gynecology | Admitting: Obstetrics & Gynecology

## 2016-06-25 ENCOUNTER — Encounter (HOSPITAL_COMMUNITY)
Admission: AD | Disposition: A | Discharge status: Home / Self Care | Payer: Self-pay | Source: Ambulatory Visit | Attending: Obstetrics & Gynecology

## 2016-06-25 ENCOUNTER — Encounter (HOSPITAL_COMMUNITY): Payer: Self-pay

## 2016-06-25 DIAGNOSIS — Z6791 Unspecified blood type, Rh negative: Secondary | ICD-10-CM | POA: Diagnosis present

## 2016-06-25 DIAGNOSIS — O99824 Streptococcus B carrier state complicating childbirth: Secondary | ICD-10-CM | POA: Diagnosis present

## 2016-06-25 DIAGNOSIS — Z87891 Personal history of nicotine dependence: Secondary | ICD-10-CM

## 2016-06-25 DIAGNOSIS — Z98891 History of uterine scar from previous surgery: Secondary | ICD-10-CM

## 2016-06-25 DIAGNOSIS — Z3A39 39 weeks gestation of pregnancy: Secondary | ICD-10-CM | POA: Diagnosis not present

## 2016-06-25 DIAGNOSIS — O26843 Uterine size-date discrepancy, third trimester: Secondary | ICD-10-CM

## 2016-06-25 DIAGNOSIS — O99214 Obesity complicating childbirth: Secondary | ICD-10-CM | POA: Diagnosis present

## 2016-06-25 DIAGNOSIS — R21 Rash and other nonspecific skin eruption: Secondary | ICD-10-CM | POA: Diagnosis not present

## 2016-06-25 DIAGNOSIS — O99324 Drug use complicating childbirth: Secondary | ICD-10-CM | POA: Diagnosis present

## 2016-06-25 DIAGNOSIS — F1490 Cocaine use, unspecified, uncomplicated: Secondary | ICD-10-CM | POA: Diagnosis present

## 2016-06-25 DIAGNOSIS — O9942 Diseases of the circulatory system complicating childbirth: Secondary | ICD-10-CM | POA: Diagnosis present

## 2016-06-25 DIAGNOSIS — O26893 Other specified pregnancy related conditions, third trimester: Secondary | ICD-10-CM | POA: Diagnosis present

## 2016-06-25 DIAGNOSIS — I4891 Unspecified atrial fibrillation: Secondary | ICD-10-CM | POA: Diagnosis present

## 2016-06-25 DIAGNOSIS — F1290 Cannabis use, unspecified, uncomplicated: Secondary | ICD-10-CM | POA: Diagnosis present

## 2016-06-25 DIAGNOSIS — Z6837 Body mass index (BMI) 37.0-37.9, adult: Secondary | ICD-10-CM | POA: Diagnosis not present

## 2016-06-25 DIAGNOSIS — O26899 Other specified pregnancy related conditions, unspecified trimester: Secondary | ICD-10-CM

## 2016-06-25 DIAGNOSIS — Z3403 Encounter for supervision of normal first pregnancy, third trimester: Secondary | ICD-10-CM | POA: Diagnosis present

## 2016-06-25 LAB — CBC
HEMATOCRIT: 34.1 % — AB (ref 36.0–46.0)
HEMOGLOBIN: 11.7 g/dL — AB (ref 12.0–15.0)
MCH: 27.9 pg (ref 26.0–34.0)
MCHC: 34.3 g/dL (ref 30.0–36.0)
MCV: 81.4 fL (ref 78.0–100.0)
Platelets: 274 10*3/uL (ref 150–400)
RBC: 4.19 MIL/uL (ref 3.87–5.11)
RDW: 15.7 % — ABNORMAL HIGH (ref 11.5–15.5)
WBC: 11 10*3/uL — ABNORMAL HIGH (ref 4.0–10.5)

## 2016-06-25 LAB — RAPID URINE DRUG SCREEN, HOSP PERFORMED
AMPHETAMINES: NOT DETECTED
BENZODIAZEPINES: NOT DETECTED
Barbiturates: NOT DETECTED
Cocaine: NOT DETECTED
OPIATES: NOT DETECTED
Tetrahydrocannabinol: NOT DETECTED

## 2016-06-25 LAB — TYPE AND SCREEN
ABO/RH(D): AB NEG
ANTIBODY SCREEN: NEGATIVE

## 2016-06-25 LAB — RPR: RPR Ser Ql: NONREACTIVE

## 2016-06-25 LAB — ABO/RH: ABO/RH(D): AB NEG

## 2016-06-25 SURGERY — Surgical Case
Anesthesia: Epidural

## 2016-06-25 MED ORDER — OXYTOCIN 10 UNIT/ML IJ SOLN
INTRAMUSCULAR | Status: AC
  Filled 2016-06-25: qty 4

## 2016-06-25 MED ORDER — SODIUM BICARBONATE 8.4 % IV SOLN
INTRAVENOUS | Status: DC | PRN
  Administered 2016-06-25 (×2): 5 mL via EPIDURAL

## 2016-06-25 MED ORDER — OXYTOCIN 10 UNIT/ML IJ SOLN
INTRAVENOUS | Status: DC | PRN
  Administered 2016-06-25: 40 [IU] via INTRAVENOUS

## 2016-06-25 MED ORDER — DEXTROSE 5 % IV SOLN
2.0000 g | Freq: Once | INTRAVENOUS | Status: DC
  Filled 2016-06-25: qty 2

## 2016-06-25 MED ORDER — MEPERIDINE HCL 25 MG/ML IJ SOLN
INTRAMUSCULAR | Status: AC
  Filled 2016-06-25: qty 1

## 2016-06-25 MED ORDER — MORPHINE SULFATE (PF) 0.5 MG/ML IJ SOLN
INTRAMUSCULAR | Status: AC
  Filled 2016-06-25: qty 10

## 2016-06-25 MED ORDER — LIDOCAINE-EPINEPHRINE (PF) 2 %-1:200000 IJ SOLN
INTRAMUSCULAR | Status: AC
  Filled 2016-06-25: qty 20

## 2016-06-25 MED ORDER — EPHEDRINE 5 MG/ML INJ
10.0000 mg | INTRAVENOUS | Status: DC | PRN
  Filled 2016-06-25: qty 2

## 2016-06-25 MED ORDER — SCOPOLAMINE 1 MG/3DAYS TD PT72
MEDICATED_PATCH | TRANSDERMAL | Status: AC
  Filled 2016-06-25: qty 1

## 2016-06-25 MED ORDER — PENICILLIN G POTASSIUM 5000000 UNITS IJ SOLR
5.0000 10*6.[IU] | Freq: Once | INTRAVENOUS | Status: AC
  Administered 2016-06-25: 5 10*6.[IU] via INTRAVENOUS
  Filled 2016-06-25: qty 5

## 2016-06-25 MED ORDER — LACTATED RINGERS IV SOLN
500.0000 mL | Freq: Once | INTRAVENOUS | Status: AC
  Administered 2016-06-25: 500 mL via INTRAVENOUS

## 2016-06-25 MED ORDER — DEXTROSE 5 % IV SOLN
2.5000 10*6.[IU] | INTRAVENOUS | Status: DC
Start: 2016-06-25 — End: 2016-06-26
  Administered 2016-06-25 (×4): 2.5 10*6.[IU] via INTRAVENOUS
  Filled 2016-06-25 (×5): qty 2.5

## 2016-06-25 MED ORDER — PHENYLEPHRINE 40 MCG/ML (10ML) SYRINGE FOR IV PUSH (FOR BLOOD PRESSURE SUPPORT)
PREFILLED_SYRINGE | INTRAVENOUS | Status: AC
  Filled 2016-06-25: qty 20

## 2016-06-25 MED ORDER — SOD CITRATE-CITRIC ACID 500-334 MG/5ML PO SOLN
30.0000 mL | ORAL | Status: DC | PRN
  Administered 2016-06-25: 30 mL via ORAL
  Filled 2016-06-25: qty 15

## 2016-06-25 MED ORDER — OXYTOCIN BOLUS FROM INFUSION: 500.0000 mL | Freq: Once | INTRAVENOUS | Status: DC

## 2016-06-25 MED ORDER — FENTANYL 2.5 MCG/ML BUPIVACAINE 1/10 % EPIDURAL INFUSION (WH - ANES)
INTRAMUSCULAR | Status: AC
  Filled 2016-06-25: qty 125

## 2016-06-25 MED ORDER — BUPIVACAINE HCL (PF) 0.5 % IJ SOLN
INTRAMUSCULAR | Status: AC
  Filled 2016-06-25: qty 30

## 2016-06-25 MED ORDER — LIDOCAINE HCL (PF) 1 % IJ SOLN
INTRAMUSCULAR | Status: DC | PRN
  Administered 2016-06-25: 6 mL via EPIDURAL
  Administered 2016-06-25: 4 mL

## 2016-06-25 MED ORDER — DEXAMETHASONE SODIUM PHOSPHATE 4 MG/ML IJ SOLN
INTRAMUSCULAR | Status: DC | PRN
  Administered 2016-06-25: 4 mg via INTRAVENOUS

## 2016-06-25 MED ORDER — FENTANYL 2.5 MCG/ML BUPIVACAINE 1/10 % EPIDURAL INFUSION (WH - ANES)
14.0000 mL/h | INTRAMUSCULAR | Status: DC | PRN
  Administered 2016-06-25 (×2): 14 mL/h via EPIDURAL
  Filled 2016-06-25: qty 125

## 2016-06-25 MED ORDER — FENTANYL CITRATE (PF) 100 MCG/2ML IJ SOLN
50.0000 ug | INTRAMUSCULAR | Status: DC | PRN
  Administered 2016-06-25 (×4): 100 ug via INTRAVENOUS
  Filled 2016-06-25 (×4): qty 2

## 2016-06-25 MED ORDER — ACETAMINOPHEN 325 MG PO TABS: 650.0000 mg | ORAL_TABLET | ORAL | Status: DC | PRN

## 2016-06-25 MED ORDER — LACTATED RINGERS IV SOLN
INTRAVENOUS | Status: DC
  Administered 2016-06-25 (×6): via INTRAVENOUS

## 2016-06-25 MED ORDER — SCOPOLAMINE 1 MG/3DAYS TD PT72
MEDICATED_PATCH | TRANSDERMAL | Status: DC | PRN
  Administered 2016-06-25: 1 via TRANSDERMAL

## 2016-06-25 MED ORDER — OXYTOCIN 40 UNITS IN LACTATED RINGERS INFUSION - SIMPLE MED: 2.5000 [IU]/h | INTRAVENOUS | Status: DC

## 2016-06-25 MED ORDER — PHENYLEPHRINE 40 MCG/ML (10ML) SYRINGE FOR IV PUSH (FOR BLOOD PRESSURE SUPPORT)
80.0000 ug | PREFILLED_SYRINGE | INTRAVENOUS | Status: DC | PRN
  Filled 2016-06-25: qty 5

## 2016-06-25 MED ORDER — OXYCODONE-ACETAMINOPHEN 5-325 MG PO TABS: 1.0000 | ORAL_TABLET | ORAL | Status: DC | PRN

## 2016-06-25 MED ORDER — DIPHENHYDRAMINE HCL 50 MG/ML IJ SOLN: 12.5000 mg | INTRAMUSCULAR | Status: DC | PRN

## 2016-06-25 MED ORDER — CEFAZOLIN SODIUM-DEXTROSE 2-3 GM-% IV SOLR
INTRAVENOUS | Status: DC | PRN
  Administered 2016-06-25: 2 g via INTRAVENOUS

## 2016-06-25 MED ORDER — DEXAMETHASONE SODIUM PHOSPHATE 4 MG/ML IJ SOLN
INTRAMUSCULAR | Status: AC
  Filled 2016-06-25: qty 1

## 2016-06-25 MED ORDER — FLEET ENEMA 7-19 GM/118ML RE ENEM: 1.0000 | ENEMA | RECTAL | Status: DC | PRN

## 2016-06-25 MED ORDER — TERBUTALINE SULFATE 1 MG/ML IJ SOLN: 0.2500 mg | Freq: Once | INTRAMUSCULAR | Status: DC | PRN

## 2016-06-25 MED ORDER — OXYCODONE-ACETAMINOPHEN 5-325 MG PO TABS: 2.0000 | ORAL_TABLET | ORAL | Status: DC | PRN

## 2016-06-25 MED ORDER — OXYTOCIN 40 UNITS IN LACTATED RINGERS INFUSION - SIMPLE MED
1.0000 m[IU]/min | INTRAVENOUS | Status: DC
  Administered 2016-06-25: 1 m[IU]/min via INTRAVENOUS
  Filled 2016-06-25: qty 1000

## 2016-06-25 MED ORDER — ONDANSETRON HCL 4 MG/2ML IJ SOLN
4.0000 mg | Freq: Four times a day (QID) | INTRAMUSCULAR | Status: DC | PRN
  Administered 2016-06-25: 4 mg via INTRAVENOUS
  Filled 2016-06-25: qty 2

## 2016-06-25 MED ORDER — ONDANSETRON HCL 4 MG/2ML IJ SOLN
INTRAMUSCULAR | Status: DC | PRN
  Administered 2016-06-25: 4 mg via INTRAVENOUS

## 2016-06-25 MED ORDER — LACTATED RINGERS IV SOLN
INTRAVENOUS | Status: DC
  Administered 2016-06-25: 11:00:00 via INTRAUTERINE

## 2016-06-25 MED ORDER — LIDOCAINE HCL (PF) 1 % IJ SOLN: 30.0000 mL | INTRAMUSCULAR | Status: DC | PRN

## 2016-06-25 MED ORDER — LACTATED RINGERS IV SOLN
500.0000 mL | INTRAVENOUS | Status: DC | PRN
  Administered 2016-06-25 (×2): 500 mL via INTRAVENOUS

## 2016-06-25 MED ORDER — CEFAZOLIN SODIUM-DEXTROSE 2-4 GM/100ML-% IV SOLN
INTRAVENOUS | Status: AC
  Filled 2016-06-25: qty 100

## 2016-06-25 MED ORDER — ZOLPIDEM TARTRATE 5 MG PO TABS
5.0000 mg | ORAL_TABLET | Freq: Every evening | ORAL | Status: DC | PRN
Start: 2016-06-25 — End: 2016-06-26

## 2016-06-25 MED ORDER — ONDANSETRON HCL 4 MG/2ML IJ SOLN
INTRAMUSCULAR | Status: AC
  Filled 2016-06-25: qty 2

## 2016-06-25 MED ORDER — LACTATED RINGERS IV SOLN
INTRAVENOUS | Status: DC | PRN
  Administered 2016-06-25 – 2016-06-26 (×2): via INTRAVENOUS

## 2016-06-25 MED ORDER — MORPHINE SULFATE (PF) 0.5 MG/ML IJ SOLN
INTRAMUSCULAR | Status: DC | PRN
  Administered 2016-06-25: 3 mg via EPIDURAL

## 2016-06-25 MED ORDER — MEPERIDINE HCL 25 MG/ML IJ SOLN
INTRAMUSCULAR | Status: DC | PRN
  Administered 2016-06-25 (×2): 12.5 mg via INTRAVENOUS

## 2016-06-25 SURGICAL SUPPLY — 32 items
CHLORAPREP W/TINT 26ML (MISCELLANEOUS) ×3 IMPLANT
CLAMP CORD UMBIL (MISCELLANEOUS) IMPLANT
CLOTH BEACON ORANGE TIMEOUT ST (SAFETY) ×3 IMPLANT
DRSG OPSITE POSTOP 4X10 (GAUZE/BANDAGES/DRESSINGS) ×3 IMPLANT
ELECT REM PT RETURN 9FT ADLT (ELECTROSURGICAL) ×3
ELECTRODE REM PT RTRN 9FT ADLT (ELECTROSURGICAL) ×1 IMPLANT
EXTRACTOR VACUUM M CUP 4 TUBE (SUCTIONS) IMPLANT
EXTRACTOR VACUUM M CUP 4' TUBE (SUCTIONS)
GLOVE BIOGEL PI IND STRL 7.0 (GLOVE) ×3 IMPLANT
GLOVE BIOGEL PI INDICATOR 7.0 (GLOVE) ×6
GLOVE ECLIPSE 7.0 STRL STRAW (GLOVE) ×3 IMPLANT
GOWN STRL REUS W/TWL LRG LVL3 (GOWN DISPOSABLE) ×6 IMPLANT
KIT ABG SYR 3ML LUER SLIP (SYRINGE) IMPLANT
NEEDLE HYPO 22GX1.5 SAFETY (NEEDLE) ×3 IMPLANT
NEEDLE HYPO 25X5/8 SAFETYGLIDE (NEEDLE) ×3 IMPLANT
NS IRRIG 1000ML POUR BTL (IV SOLUTION) ×3 IMPLANT
PACK C SECTION WH (CUSTOM PROCEDURE TRAY) ×3 IMPLANT
PAD ABD 7.5X8 STRL (GAUZE/BANDAGES/DRESSINGS) ×3 IMPLANT
PAD ABD 8X7 1/2 STERILE (GAUZE/BANDAGES/DRESSINGS) ×3 IMPLANT
PAD OB MATERNITY 4.3X12.25 (PERSONAL CARE ITEMS) ×3 IMPLANT
PENCIL SMOKE EVAC W/HOLSTER (ELECTROSURGICAL) ×3 IMPLANT
RTRCTR C-SECT PINK 25CM LRG (MISCELLANEOUS) IMPLANT
SPONGE GAUZE 4X4 12PLY STER LF (GAUZE/BANDAGES/DRESSINGS) ×6 IMPLANT
SPONGE LAP 18X18 X RAY DECT (DISPOSABLE) ×3 IMPLANT
SUT PDS AB 0 CTX 36 PDP370T (SUTURE) ×3 IMPLANT
SUT PLAIN 2 0 XLH (SUTURE) IMPLANT
SUT VIC AB 0 CTX 36 (SUTURE) ×6
SUT VIC AB 0 CTX36XBRD ANBCTRL (SUTURE) ×3 IMPLANT
SUT VIC AB 4-0 KS 27 (SUTURE) ×3 IMPLANT
SYR CONTROL 10ML LL (SYRINGE) ×3 IMPLANT
TOWEL OR 17X24 6PK STRL BLUE (TOWEL DISPOSABLE) ×3 IMPLANT
TRAY FOLEY CATH SILVER 14FR (SET/KITS/TRAYS/PACK) ×3 IMPLANT

## 2016-06-25 NOTE — Anesthesia Procedure Notes (Signed)

## 2016-06-25 NOTE — Progress Notes (Signed)
Report called to CNM; will place orders for admission

## 2016-06-25 NOTE — Progress Notes (Signed)
Patient ID: Nichole Bowers, female   DOB: 1991/11/04, 25 y.o.   MRN: [id number]  Comfortable with epidural.  Blood pressure 110/64, pulse (!) 101, temperature 99.1 F (37.3 C), temperature source Oral, resp. rate 18, height 5\' 5"  (1.651 m), weight 227 lb (103 kg), last menstrual period 09/15/2015, SpO2 100 %. FHR 130s, 10 x10 accels and +scalp stimulation, frequent mild variables, moderate LTV Ctx 1-5 mins Cervix 5cm/80/-2; cervical edema and fetal head moulding noted. No change since 0800. MVU's around 160-180 since 90  24 y.o. G1P0 at [redacted]w[redacted]d; prolonged latent phase after presenting in labor early this morning. Reassuring FHT. Concerned about prolonged latent phase, fetal head moulding and cervical edema; this decreases likelihood of vaginal delivery.  Discussed this with patient and her mother.  Will continue to monitor for a couple of hours, and if there is no change despite adequate contractions, will proceed with cesarean delivery for failure of cervical dilation. Will continue to increase pitocin per protocol.  Will continue close observation   Osborne Oman, MD

## 2016-06-25 NOTE — Anesthesia Preprocedure Evaluation (Signed)

## 2016-06-25 NOTE — MAU Note (Signed)
Pt here with contractions since about 2300, 3-66min apart. Denies any bleeding or leaking of fluid. Denies any problems with the pregnancy.

## 2016-06-25 NOTE — Progress Notes (Signed)
Patient ID: Nichole Bowers, female   DOB: [birth year], 25 y.o.   MRN: [id number]  Has had four doses of Fentanyl; feels some cramping now  VSS, afeb FHR 120s, +SS, frequent mild variables, avg LTV Ctx 1-5 mins Cx post 4cm/50/-2; IUPC inserted without difficulty  IUP@term  Latent phase/SROM  Will start amnioinfusion for FHR variables  Kalan Yeley CNM 06/25/2016 11:08 AM

## 2016-06-25 NOTE — Progress Notes (Addendum)
Patient ID: Nichole Bowers, female   DOB: 1991-08-03, 25 y.o.   MRN: [id number]  CTSP after prolonged decel to nadir of 70-80s x 4 mins total, resolved after position change and fluid bolus. Pit left on 81mu/min.  Comfortable w/ epidural. Cx 5+/80/-2; pelvis w/ narrow outlet FHR now 140s, +SS during exam Ctx q 1-5 mins, MVUs less than adequate w/ dysfunctional ctx pattern  IUP@term  Early active labor FHR changes  Will continue to try to get MVUs adequate Watch FHR  [name], [facility] 06/25/2016 5:32 PM

## 2016-06-25 NOTE — Progress Notes (Signed)
Patient ID: Nichole Bowers, female   DOB: [birth year], 25 y.o.   MRN: [id number]  Comfortable with epidural. No complaints  Blood pressure (!) 119/57, pulse (!) 110, temperature 99.1 F (37.3 C), temperature source Oral, resp. rate 16, height 5\' 5"  (1.651 m), weight 227 lb (103 kg), last menstrual period 09/15/2015, SpO2 100 %. FHR 150s, + accels and + variable decelerations, moderate LTV Ctx 1-4 mins Cervix 5cm/80/-2; cervical edema and fetal head moulding noted. No change since 0800. MVU's around 170-195 since 1930  24 y.o. G1P0 at [redacted]w[redacted]d; prolonged latent phase/failure of cervical dilation. Reassuring FHT.  Cesarean delivery recommended. The risks of cesarean section discussed with the patient included but were not limited to: bleeding which may require transfusion or reoperation; infection which may require antibiotics; injury to bowel, bladder, ureters or other surrounding organs; injury to the fetus; need for additional procedures including hysterectomy in the event of a life-threatening hemorrhage; placental abnormalities wth subsequent pregnancies, incisional problems, thromboembolic phenomenon and other postoperative/anesthesia complications. The patient concurred with the proposed plan, giving informed written consent for the procedure.   Anesthesia and OR aware. Preoperative prophylactic antibiotics and SCDs ordered on call to the OR.  To OR when ready.   Osborne Oman, MD

## 2016-06-25 NOTE — Progress Notes (Signed)
Patient ID: Mida Hlinka, female   DOB: 04-21-1991, 25 y.o.   MRN: [id number]   Estafani Eyerly is a 25 y.o. G1P0 at [redacted]w[redacted]d  admitted for active labor  Subjective: SROM with light mec around 0445  Objective: Vitals:   06/25/16 0050 06/25/16 0405 06/25/16 0450 06/25/16 0600  BP: 132/80  128/85 132/81  Pulse: 104  88 86  Resp:   18 16  Temp:  98.8 F (37.1 C)  98.4 F (36.9 C)  TempSrc:  Oral  Oral  Weight:  227 lb (103 kg)    Height:  5\' 5"  (1.651 m)     No intake/output data recorded.  FHT:  FHR: 140 bpm, variability: moderate,  accelerations:  Present,  decelerations:  Absent UC:   regular, every 2-3 minutes SVE:   Dilation: 3.5 Effacement (%): 70 Station: Ballotable Exam by:: Bing Neighbors RN    Labs: Lab Results  Component Value Date   WBC 11.0 (H) 06/25/2016   HGB 11.7 (L) 06/25/2016   HCT 34.1 (L) 06/25/2016   MCV 81.4 06/25/2016   PLT 274 06/25/2016    Assessment / Plan: Spontaneous labor, progressing normally  Labor: Progressing normally Fetal Wellbeing:  Category I Pain Control:  IV pain meds Anticipated MOD:  NSVD  CRESENZO-DISHMAN,Sundy Houchins 06/25/2016, 6:27 AM

## 2016-06-25 NOTE — Anesthesia Pain Management Evaluation Note (Signed)
  CRNA Pain Management Visit Note  Patient: Nichole Bowers, 25 y.o., female  "Hello I am a member of the anesthesia team at [facility]. We have an anesthesia team available at all times to provide care throughout the hospital, including epidural management and anesthesia for C-section. I don't know your plan for the delivery whether it a natural birth, water birth, IV sedation, nitrous supplementation, doula or epidural, but we want to meet your pain goals."   1.Was your pain managed to your expectations on prior hospitalizations?   NA  2.What is your expectation for pain management during this hospitalization?     IVP meds  3.How can we help you reach that goal? Additional support measures as needed.  Record the patient's initial score and the patient's pain goal.   Pain: 9  Pain Goal: 8-10  The [facility] wants you to be able to say your pain was always managed very well.  [facility] 06/25/2016

## 2016-06-25 NOTE — H&P (Signed)
Nichole Bowers is a 25 y.o. female G1P0 with IUP at [redacted]w[redacted]d presenting for contractions. Pt states she has been having regular, every 3-4 minutes contractions, associated with none vaginal bleeding for 4 hours..  Membranes are intact, with active fetal movement.   [facility] at [facility] since 11 wks  Prenatal History/Complications: Cocaine use early pregnancy THC until 3rd trimester  Past Medical History: Past Medical History:  Diagnosis Date  . Atrial fibrillation (HCC)   . Cocaine use    Positive drug screen on 11/03/15  . Marijuana use    Positive drug screen on 11/03/15  . Urinary tract infection   . Ventral hernia without obstruction or gangrene     Past Surgical History: Past Surgical History:  Procedure Laterality Date  . WISDOM TOOTH EXTRACTION      Obstetrical History: OB History    Gravida Para Term Preterm AB Living   1         0   SAB TAB Ectopic Multiple Live Births                   Social History: Social History   Social History  . Marital status: Single    Spouse name: N/A  . Number of children: N/A  . Years of education: N/A   Social History Main Topics  . Smoking status: Former Smoker    Types: Cigarettes    Quit date: 11/19/2015  . Smokeless tobacco: Never Used  . Alcohol use No  . Drug use: No     Comment: Hx marijuana and cocaine use  . Sexual activity: Yes    Birth control/ protection: None   Other Topics Concern  . None   Social History Narrative  . None    Family History: No family history on file.  Allergies: Allergies  Allergen Reactions  . Phenergan [Promethazine] Nausea And Vomiting    No prescriptions prior to admission.     Prenatal Transfer Tool  Maternal Diabetes: No Genetic Screening: Normal Maternal Ultrasounds/Referrals: Normal Fetal Ultrasounds or other Referrals:  None Maternal Substance Abuse:  Yes:  Type: Marijuana, Cocaine Significant Maternal Medications:  None Significant Maternal Lab Results: Lab  values include: Group B Strep positive     Review of Systems   Constitutional: Negative for fever and chills Eyes: Negative for visual disturbances Respiratory: Negative for shortness of breath, dyspnea Cardiovascular: Negative for chest pain or palpitations  Gastrointestinal: Negative for vomiting, diarrhea and constipation.  POSITIVE for abdominal pain (contractions) Genitourinary: Negative for dysuria and urgency Musculoskeletal: Negative for back pain, joint pain, myalgias  Neurological: Negative for dizziness and headaches      Blood pressure 132/80, pulse 104, temperature 98.7 F (37.1 C), temperature source Oral, resp. rate 20, height 5\' 5"  (1.651 m), weight 227 lb (103 kg), last menstrual period 09/15/2015. General appearance: alert and cooperative Lungs: clear to auscultation bilaterally Heart: regular rate and rhythm Abdomen: soft, non-tender; bowel sounds normal Extremities: Homans sign is negative, no sign of DVT DTR's 2+ Presentation: cephalic Fetal monitoring  Baseline: 135 bpm, Variability: Good {> 6 bpm), Accelerations: Reactive and Decelerations: Absent Uterine activity  2=3 minutes Dilation: 3.5 Effacement (%): 80 Station: -3 Exam by:: Scientist, product/process development RN Cx Changed from 2/thick in MAU  Prenatal labs: ABO, Rh: AB/NEG/-- (12/05 1119) Antibody: NEG (12/05 1119) Rubella: !Error! RPR: NON REAC (05/09 1535)  HBsAg: NEGATIVE (12/05 1119)  HIV: NONREACTIVE (05/09 1535)  GBS: Positive (07/11 0000)    [facility] Prenatal Labs  Dating 11  week [place] confirmed by 20 wk [place] Blood type: AB/NEG/-- (12/05 1119)   Genetic Screen Normal NT/NB 1 Screen:normal    AFP: Neg Antibody:NEG (12/05 1119)  Anatomic [place]  normal female Rubella: 1.66 (12/05 1119)  GTT Early:               Third trimester: 122 RPR: NON REAC (12/05 1119)   Flu vaccine  Did not obtain HBsAg: NEGATIVE (12/05 1119)   TDaP vaccine  given 04/16/16                 Rhogam: 04/06/16 HIV: NONREACTIVE (12/05 1119)    Baby Food     breast                                          GBS: Pos (For PCN allergy, check sensitivities)  Contraception  undecided Pap: normal (12/16/15)  Circumcision  yes   Pediatrician  undecided   Support Person  Mom     No results found for this or any previous visit (from the past 24 hour(s)).  Assessment: Nichole Bowers is a 26 y.o. G1P0 with an IUP at [redacted]w[redacted]d presenting for active labor  Plan: #Labor: expectant management #Pain:  Per request #FWB Cat 1 #ID: GBS: PCN  #MOF:  breast #MOC: ? #Circ: yes   CRESENZO-DISHMAN,Kendrik Mcshan 06/25/2016, 3:00 AM

## 2016-06-26 ENCOUNTER — Encounter (HOSPITAL_COMMUNITY): Payer: Self-pay | Admitting: *Deleted

## 2016-06-26 DIAGNOSIS — O99824 Streptococcus B carrier state complicating childbirth: Secondary | ICD-10-CM

## 2016-06-26 DIAGNOSIS — Z3A39 39 weeks gestation of pregnancy: Secondary | ICD-10-CM

## 2016-06-26 LAB — CBC
HCT: 31.5 % — ABNORMAL LOW (ref 36.0–46.0)
HEMOGLOBIN: 10.8 g/dL — AB (ref 12.0–15.0)
MCH: 28.1 pg (ref 26.0–34.0)
MCHC: 34.3 g/dL (ref 30.0–36.0)
MCV: 81.8 fL (ref 78.0–100.0)
PLATELETS: 250 10*3/uL (ref 150–400)
RBC: 3.85 MIL/uL — AB (ref 3.87–5.11)
RDW: 15.8 % — ABNORMAL HIGH (ref 11.5–15.5)
WBC: 19 10*3/uL — AB (ref 4.0–10.5)

## 2016-06-26 MED ORDER — PRENATAL MULTIVITAMIN CH
1.0000 | ORAL_TABLET | Freq: Every day | ORAL | Status: DC
  Administered 2016-06-26 – 2016-06-28 (×3): 1 via ORAL
  Filled 2016-06-26 (×3): qty 1

## 2016-06-26 MED ORDER — SODIUM CHLORIDE 0.9 % IR SOLN
Status: DC | PRN
Start: 2016-06-26 — End: 2016-06-26
  Administered 2016-06-26: 1

## 2016-06-26 MED ORDER — BUPIVACAINE HCL 0.5 % IJ SOLN
INTRAMUSCULAR | Status: DC | PRN
  Administered 2016-06-26: 30 mL

## 2016-06-26 MED ORDER — MEASLES, MUMPS & RUBELLA VAC ~~LOC~~ INJ
0.5000 mL | INJECTION | Freq: Once | SUBCUTANEOUS | Status: DC
  Filled 2016-06-26: qty 0.5

## 2016-06-26 MED ORDER — DIBUCAINE 1 % RE OINT: 1.0000 "application " | TOPICAL_OINTMENT | RECTAL | Status: DC | PRN

## 2016-06-26 MED ORDER — MAGNESIUM HYDROXIDE 400 MG/5ML PO SUSP: 30.0000 mL | ORAL | Status: DC | PRN

## 2016-06-26 MED ORDER — WITCH HAZEL-GLYCERIN EX PADS: 1.0000 "application " | MEDICATED_PAD | CUTANEOUS | Status: DC | PRN

## 2016-06-26 MED ORDER — OXYCODONE-ACETAMINOPHEN 5-325 MG PO TABS
1.0000 | ORAL_TABLET | ORAL | Status: DC | PRN
  Administered 2016-06-26 – 2016-06-27 (×4): 1 via ORAL
  Filled 2016-06-26 (×4): qty 1

## 2016-06-26 MED ORDER — ZOLPIDEM TARTRATE 5 MG PO TABS: 5.0000 mg | ORAL_TABLET | Freq: Every evening | ORAL | Status: DC | PRN

## 2016-06-26 MED ORDER — SIMETHICONE 80 MG PO CHEW
80.0000 mg | CHEWABLE_TABLET | ORAL | Status: DC
  Administered 2016-06-27: 80 mg via ORAL
  Filled 2016-06-26: qty 1

## 2016-06-26 MED ORDER — TETANUS-DIPHTH-ACELL PERTUSSIS 5-2.5-18.5 LF-MCG/0.5 IM SUSP: 0.5000 mL | Freq: Once | INTRAMUSCULAR | Status: DC

## 2016-06-26 MED ORDER — RHO D IMMUNE GLOBULIN 1500 UNIT/2ML IJ SOSY
300.0000 ug | PREFILLED_SYRINGE | Freq: Once | INTRAMUSCULAR | Status: AC
  Administered 2016-06-26: 300 ug via INTRAVENOUS
  Filled 2016-06-26: qty 2

## 2016-06-26 MED ORDER — MENTHOL 3 MG MT LOZG: 1.0000 | LOZENGE | OROMUCOSAL | Status: DC | PRN

## 2016-06-26 MED ORDER — SENNOSIDES-DOCUSATE SODIUM 8.6-50 MG PO TABS
2.0000 | ORAL_TABLET | ORAL | Status: DC
  Administered 2016-06-26 – 2016-06-27 (×2): 2 via ORAL
  Filled 2016-06-26 (×2): qty 2

## 2016-06-26 MED ORDER — DIPHENHYDRAMINE HCL 25 MG PO CAPS
25.0000 mg | ORAL_CAPSULE | Freq: Four times a day (QID) | ORAL | Status: DC | PRN
  Administered 2016-06-26 – 2016-06-27 (×2): 25 mg via ORAL
  Filled 2016-06-26 (×2): qty 1

## 2016-06-26 MED ORDER — LACTATED RINGERS IV SOLN
INTRAVENOUS | Status: DC
  Administered 2016-06-26: 12:00:00 via INTRAVENOUS

## 2016-06-26 MED ORDER — ACETAMINOPHEN 325 MG PO TABS
650.0000 mg | ORAL_TABLET | ORAL | Status: DC | PRN
Start: 2016-06-26 — End: 2016-06-28
  Filled 2016-06-26: qty 2

## 2016-06-26 MED ORDER — FERROUS SULFATE 325 (65 FE) MG PO TABS
325.0000 mg | ORAL_TABLET | Freq: Two times a day (BID) | ORAL | Status: DC
  Administered 2016-06-26 – 2016-06-28 (×5): 325 mg via ORAL
  Filled 2016-06-26 (×5): qty 1

## 2016-06-26 MED ORDER — COCONUT OIL OIL
1.0000 | TOPICAL_OIL | Status: DC | PRN
Start: 2016-06-26 — End: 2016-06-28

## 2016-06-26 MED ORDER — IBUPROFEN 600 MG PO TABS
600.0000 mg | ORAL_TABLET | Freq: Four times a day (QID) | ORAL | Status: DC
  Administered 2016-06-26 – 2016-06-28 (×9): 600 mg via ORAL
  Filled 2016-06-26 (×9): qty 1

## 2016-06-26 MED ORDER — SIMETHICONE 80 MG PO CHEW
80.0000 mg | CHEWABLE_TABLET | ORAL | Status: DC | PRN
  Administered 2016-06-26 (×2): 80 mg via ORAL
  Filled 2016-06-26 (×2): qty 1

## 2016-06-26 MED ORDER — OXYCODONE-ACETAMINOPHEN 5-325 MG PO TABS
2.0000 | ORAL_TABLET | ORAL | Status: DC | PRN
  Administered 2016-06-26 – 2016-06-28 (×4): 2 via ORAL
  Filled 2016-06-26 (×4): qty 2

## 2016-06-26 MED ORDER — OXYTOCIN 40 UNITS IN LACTATED RINGERS INFUSION - SIMPLE MED: 2.5000 [IU]/h | INTRAVENOUS | Status: AC

## 2016-06-26 NOTE — Progress Notes (Signed)
Assumed care of mom.  Visitors at bedside.

## 2016-06-26 NOTE — Progress Notes (Signed)
Spoke with Dr Vanetta Shawl regarding pt elevated temp. Will give Motrin and recheck in one hour. If temp still elevated, will let MD know. Nichole Bowers

## 2016-06-26 NOTE — Transfer of Care (Addendum)
Immediate Anesthesia Transfer of Care Note  Patient: Nichole Bowers  Procedure(s) Performed: Procedure(s): CESAREAN SECTION (N/A)  Patient Location: PACU  Anesthesia Type:Epidural  Level of Consciousness: awake, alert , oriented and patient cooperative  Airway & Oxygen Therapy: Patient Spontanous Breathing  Post-op Assessment: Report given to RN and Post -op Vital signs reviewed and stable  Post vital signs: Reviewed and stable  Last Vitals:  BP 110/79 HR 90 RR 24 TEMP 98.8 POX 100  Last Pain: 0  Pain Goal: 4 Vitals:   06/25/16 2252  TempSrc:   PainSc: 0-No pain        Complications: No apparent anesthesia complications

## 2016-06-26 NOTE — Op Note (Signed)
Nichole Bowers PROCEDURE DATE: 06/26/2016  PREOPERATIVE DIAGNOSES: Intrauterine pregnancy at [redacted]w[redacted]d weeks gestation; failure to progress: arrest of dilation  POSTOPERATIVE DIAGNOSES: The same  PROCEDURE: Primary Low Transverse Cesarean Section  SURGEON:  Dr. Verita Schneiders  ASSISTANT:  Francesco Runner, [facility] MS III  ANESTHESIOLOGIST: Dr. Lauretta Grill  INDICATIONS: Nichole Bowers is a 25 y.o. G1P1001 at [redacted]w[redacted]d here for cesarean section secondary to the indications listed under preoperative diagnoses; please see preoperative note for further details.  The risks of cesarean section were discussed with the patient including but were not limited to: bleeding which may require transfusion or reoperation; infection which may require antibiotics; injury to bowel, bladder, ureters or other surrounding organs; injury to the fetus; need for additional procedures including hysterectomy in the event of a life-threatening hemorrhage; placental abnormalities wth subsequent pregnancies, incisional problems, thromboembolic phenomenon and other postoperative/anesthesia complications.   The patient concurred with the proposed plan, giving informed written consent for the procedure.    FINDINGS:  Viable female infant in cephalic presentation.  Apgars 2 and 8.  Meconium noted in amniotic fluid. Nuchal cord x 1.  Intact placenta, three vessel cord.  Normal uterus, fallopian tubes and ovaries bilaterally.  ANESTHESIA: Epidural INTRAVENOUS FLUIDS: 1800 ml ESTIMATED BLOOD LOSS: 650 ml URINE OUTPUT:  400 ml SPECIMENS: Placenta sent to L&D COMPLICATIONS: None immediate  PROCEDURE IN DETAIL:  The patient preoperatively received intravenous antibiotics and had sequential compression devices applied to her lower extremities.  She was then taken to the operating room where the epidural anesthesia was dosed up to surgical level and was found to be adequate. She was then placed in a dorsal supine position with a leftward tilt,  and prepped and draped in a sterile manner.  A foley catheter was placed into her bladder and attached to constant gravity.  After an adequate timeout was performed, a Pfannenstiel skin incision was made with scalpel and carried through to the underlying layer of fascia. The fascia was incised in the midline, and this incision was extended bilaterally using the Mayo scissors.  Kocher clamps were applied to the superior aspect of the fascial incision and the underlying rectus muscles were dissected off bluntly.  A similar process was carried out on the inferior aspect of the fascial incision. The rectus muscles were separated in the midline bluntly and the peritoneum was entered bluntly. Attention was turned to the lower uterine segment where a low transverse hysterotomy was made with a scalpel and extended bilaterally bluntly.  The infant was successfully delivered, the cord was clamped and cut after one minute, and the infant was handed over to the awaiting neonatology team. Uterine massage was then administered, and the placenta delivered intact with a three-vessel cord. The uterus was then cleared of clot and debris.  The hysterotomy was closed with 0 Vicryl in a running locked fashion, and an imbricating layer was also placed with 0 Vicryl.  The pelvis was cleared of all clot and debris. Hemostasis was confirmed on all surfaces.  The peritoneum was reapproximated using a 0 Vicryl running stitch. The fascia was then closed using 0 PDS in a running fashion.  The subcutaneous layer was irrigated, and 30 ml of 0.5% Marcaine was injected subcutaneously around the incision.  The skin was closed with a 4-0 Vicryl subcuticular stitch. The patient tolerated the procedure well. Sponge, lap, instrument and needle counts were correct x 3.  She was taken to the recovery room in stable condition.    Verita Schneiders,  MD, [place] Attending [place], [place]

## 2016-06-26 NOTE — Progress Notes (Signed)
CSW acknowledged consult and attempted to meet with MOB.  MOB was being attended to by bedside nurse.  CSW will attempt to meet with MOB at a later time.   Laurey Arrow, MSW, LCSW Clinical Social Work [phone]

## 2016-06-26 NOTE — Anesthesia Postprocedure Evaluation (Signed)
Anesthesia Post Note  Patient: Nichole Bowers  Procedure(s) Performed: Procedure(s) (LRB): CESAREAN SECTION (N/A)  Patient location during evaluation: Mother Baby Anesthesia Type: Epidural Level of consciousness: awake and alert, oriented and patient cooperative Pain management: pain level controlled Vital Signs Assessment: post-procedure vital signs reviewed and stable Respiratory status: spontaneous breathing Cardiovascular status: stable Postop Assessment: no headache, epidural receding, patient able to bend at knees and no signs of nausea or vomiting Anesthetic complications: no Comments: No c/o pain.     Last Vitals:  Vitals:   06/26/16 0340 06/26/16 0440  BP: (!) 138/59 127/68  Pulse: 88 83  Resp: 20 20  Temp: 37 C 37.4 C    Last Pain:  Vitals:   06/26/16 0440  TempSrc: Axillary  PainSc:    Pain Goal: Patients Stated Pain Goal: 8 (06/25/16 1209)               Rico Sheehan

## 2016-06-27 LAB — RH IG WORKUP (INCLUDES ABO/RH)
ABO/RH(D): AB NEG
FETAL SCREEN: NEGATIVE
GESTATIONAL AGE(WKS): 39
Unit division: 0

## 2016-06-27 MED ORDER — HYDROXYZINE HCL 50 MG/ML IM SOLN
50.0000 mg | Freq: Four times a day (QID) | INTRAMUSCULAR | Status: DC | PRN
  Administered 2016-06-27: 50 mg via INTRAMUSCULAR
  Filled 2016-06-27 (×3): qty 1

## 2016-06-27 NOTE — Clinical Social Work Maternal (Signed)
  CLINICAL SOCIAL WORK MATERNAL/CHILD NOTE  Patient Details  Name: Nichole Bowers MRN: [id number] Date of Birth: 1991/06/08  Date:  06/27/2016  Clinical Social Worker Initiating Note:   (Olar Santini lcsw) Date/ Time Initiated:  06/27/16/1408     Child's Name:      Legal Guardian:  Mother   Need for Interpreter:  None   Date of Referral:  06/26/16     Reason for Referral:  Current Substance Use/Substance Use During Pregnancy    Referral Source:  RN   Address:   ([address] [address])  Phone number:  [phone]   Household Members:  Self, Parents, Siblings   Natural Supports (not living in the home):  Spouse/significant other, Extended Family, Medical laboratory scientific officer, Friends   Chiropodist: None   Employment: Unemployed   Type of Work:     Education:  Database administrator Resources:  Medicaid   Other Resources:      Cultural/Religious Considerations Which May Impact Care:  None noted.  Strengths:  Home prepared for child , Compliance with medical plan , Ability to meet basic needs    Risk Factors/Current Problems:  Substance Use    Cognitive State:  Alert , Able to Concentrate    Mood/Affect:  Relaxed , Calm    CSW Assessment:CSW met with pt to discuss consult/explain drug exposed newborn policy/procedures.  Pt admittedly used cocaine during her first trimester and THC during her second trimester.  Pt reports that she did not use illegal substances during her third trimester and is currently "clean."  CSW explained that both a UDS drug screen and cord testing would be performed on her baby, with any positive screens reported to [facility]. CPS.  Pt indicated that she understood the necessity of said testing is agreeable to cooperate.  Pt/baby will d/c to the maternal grandmother's home, where pt was residing PTA.Marland Kitchen  She reports strong family support from her mother,sister, and grandmother, as well as from the FOB.  Pt maintains that her home  is prepared to receive baby and she currently has everything she needs for her.  Pt to apply for WIC benefits at d/c and CSW provided her with [facility] pediatrician list, per her request.  Pt does not endorse any MH hx.  PPD discussed and pt agreeable to f/u with MD for any changes in mood/behavior.  MGM also agreed to assist pt in getting medical help in such a case.  [place] support group flyer giver to pt.  No other social work needs identify.  CSW will continue to follow for cord tissue results and f/u accordingly.  CSW Plan/Description:  Psychosocial Support and Ongoing Assessment of Needs    Sharisse Rantz, Miachel Roux, LCSW 06/27/2016, 2:13 PM

## 2016-06-27 NOTE — Progress Notes (Signed)
Spoke with Duard Brady, CNM about pt still c/o itching after Benadryl. Verbal order received for 50mg  IM Vistaril for itching. Waiting to receive from pharmacy. Wille Celeste

## 2016-06-27 NOTE — Progress Notes (Signed)
Spoke with CNM Duard Brady and notified her that after pt showered and removed pressure dressing, honeycomb dressing was about 75% saturated with old drainage/blood. Verbal order received to change dressing. Also notified that pt has raised rash with itching on arms and legs, will give Benadryl, if no relief, will let Darlene know. Wille Celeste

## 2016-06-27 NOTE — Progress Notes (Signed)
Patient ID: Nichole Bowers, female   DOB: 1991-02-11, 25 y.o.   MRN: [id number]  POSTPARTUM PROGRESS NOTE  Post Op/Partum Day #1 Subjective:  Nichole Bowers is a 25 y.o. G1P1001 [redacted]w[redacted]d s/p PLTCS 2/2 arrest of dilation.  No acute events overnight.  Pt denies problems with ambulating, voiding or po intake.  She denies nausea or vomiting.  Pain is well controlled.  She has had flatus. She has not had bowel movement.  Lochia Minimal.   Objective: Blood pressure 108/80, pulse 79, temperature 98 F (36.7 C), temperature source Oral, resp. rate 20, height 5\' 5"  (1.651 m), weight 227 lb (103 kg), last menstrual period 09/15/2015, SpO2 96 %, unknown if currently breastfeeding.  Physical Exam:  General: alert, cooperative and no distress Lochia:normal flow Chest: CTAB Heart: RRR no m/r/g Abdomen: +BS, soft, nontender,  Uterine Fundus: firm, below umbilicus DVT Evaluation: No calf swelling or tenderness Extremities: Trace edema   Recent Labs  06/25/16 0330 06/26/16 0748  HGB 11.7* 10.8*  HCT 34.1* 31.5*    Assessment/Plan:  ASSESSMENT: Nichole Bowers is a 25 y.o. G1P1001 [redacted]w[redacted]d s/p PLTCS.  Plan for discharge tomorrow, Breastfeeding and Contraception Nexplanon   LOS: 2 days   Katherine Basset, DO 06/27/2016, 9:48 AM

## 2016-06-28 MED ORDER — IBUPROFEN 600 MG PO TABS
600.0000 mg | ORAL_TABLET | Freq: Four times a day (QID) | ORAL | 0 refills | Status: DC | PRN
Start: 2016-06-28 — End: 2018-05-12

## 2016-06-28 MED ORDER — OXYCODONE-ACETAMINOPHEN 5-325 MG PO TABS: 1.0000 | ORAL_TABLET | ORAL | 0 refills | Status: DC | PRN

## 2016-06-28 MED ORDER — PREDNISONE 20 MG PO TABS: 40.0000 mg | ORAL_TABLET | Freq: Every day | ORAL | 0 refills | Status: AC

## 2016-06-28 NOTE — Discharge Instructions (Signed)
You can take the prednisone 40mg  once a day for 5 days to help your itching. Please make an appointment with the general surgeon for 3 months from now to discuss the hernia repair.  Cesarean Delivery, Care After Refer to this sheet in the next few weeks. These instructions provide you with information on caring for yourself after your procedure. Your health care provider may also give you specific instructions. Your treatment has been planned according to current medical practices, but problems sometimes occur. Call your health care provider if you have any problems or questions after you go home. HOME CARE INSTRUCTIONS  Only take over-the-counter or prescription medications as directed by your health care provider.  Do not drink alcohol, especially if you are breastfeeding or taking medication to relieve pain.  Do not chew or smoke tobacco.  Continue to use good perineal care. Good perineal care includes:  Wiping your perineum from front to back.  Keeping your perineum clean.  Check your surgical cut (incision) daily for increased redness, drainage, swelling, or separation of skin.  Clean your incision gently with soap and water every day, and then pat it dry. If your health care provider says it is okay, leave the incision uncovered. Use a bandage (dressing) if the incision is draining fluid or appears irritated. If the adhesive strips across the incision do not fall off within 7 days, carefully peel them off.  Hug a pillow when coughing or sneezing until your incision is healed. This helps to relieve pain.  Do not use tampons or douche until your health care provider says it is okay.  Shower, wash your hair, and take tub baths as directed by your health care provider.  Wear a well-fitting bra that provides breast support.  Limit wearing support panties or control-top hose.  Drink enough fluids to keep your urine clear or pale yellow.  Eat high-fiber foods such as whole grain  cereals and breads, brown rice, beans, and fresh fruits and vegetables every day. These foods may help prevent or relieve constipation.  Resume activities such as climbing stairs, driving, lifting, exercising, or traveling as directed by your health care provider.  Talk to your health care provider about resuming sexual activities. This is dependent upon your risk of infection, your rate of healing, and your comfort and desire to resume sexual activity.  Try to have someone help you with your household activities and your newborn for at least a few days after you leave the hospital.  Rest as much as possible. Try to rest or take a nap when your newborn is sleeping.  Increase your activities gradually.  Keep all of your scheduled postpartum appointments. It is very important to keep your scheduled follow-up appointments. At these appointments, your health care provider will be checking to make sure that you are healing physically and emotionally. SEEK MEDICAL CARE IF:   You are passing large clots from your vagina. Save any clots to show your health care provider.  You have a foul smelling discharge from your vagina.  You have trouble urinating.  You are urinating frequently.  You have pain when you urinate.  You have a change in your bowel movements.  You have increasing redness, pain, or swelling near your incision.  You have pus draining from your incision.  Your incision is separating.  You have painful, hard, or reddened breasts.  You have a severe headache.  You have blurred vision or see spots.  You feel sad or depressed.  You  have thoughts of hurting yourself or your newborn.  You have questions about your care, the care of your newborn, or medications.  You are dizzy or light-headed.  You have a rash.  You have pain, redness, or swelling at the site of the removed intravenous access (IV) tube.  You have nausea or vomiting.  You stopped breastfeeding and  have not had a menstrual period within 12 weeks of stopping.  You are not breastfeeding and have not had a menstrual period within 12 weeks of delivery.  You have a fever. SEEK IMMEDIATE MEDICAL CARE IF:  You have persistent pain.  You have chest pain.  You have shortness of breath.  You faint.  You have leg pain.  You have stomach pain.  Your vaginal bleeding saturates 2 or more sanitary pads in 1 hour. MAKE SURE YOU:   Understand these instructions.  Will watch your condition.  Will get help right away if you are not doing well or get worse.   This information is not intended to replace advice given to you by your health care provider. Make sure you discuss any questions you have with your health care provider.   Document Released: 08/07/2002 Document Revised: 12/06/2014 Document Reviewed: 07/12/2012 Elsevier Interactive Patient Education [employer].

## 2016-06-28 NOTE — Progress Notes (Signed)
Subjective:  Nichole Bowers is a 25 y.o. G1P1001 at [redacted]w[redacted]d being seen today for ongoing prenatal care.  She is currently monitored for the following issues for this high-risk pregnancy and has Rh negative state in antepartum period; UTI in pregnancy; Cocaine abuse complicating pregnancy; Marijuana use; Supervision of other high risk pregnancy, antepartum; Ventral hernia without obstruction or gangrene; Uterine size-date discrepancy in third trimester, antepartum; Failure of cervical dilation; and S/P cesarean section for failure of cervical dilation on her problem list.  Patient reports no complaints.  Contractions: Irritability. Vag. Bleeding: None.  Movement: Present. Denies leaking of fluid.   The following portions of the patient's history were reviewed and updated as appropriate: allergies, current medications, past family history, past medical history, past social history, past surgical history and problem list. Problem list updated.  Objective:   Vitals:   06/23/16 0951  BP: 132/85  Pulse: (!) 105  Weight: 239 lb (108.4 kg)    Fetal Status: Fetal Heart Rate (bpm): 139   Movement: Present     General:  Alert, oriented and cooperative. Patient is in no acute distress.  Skin: Skin is warm and dry. No rash noted.   Cardiovascular: Normal heart rate noted  Respiratory: Normal respiratory effort, no problems with respiration noted  Abdomen: Soft, gravid, appropriate for gestational age. Pain/Pressure: Present     Pelvic:  Cervical exam deferred        Extremities: Normal range of motion.  Edema: Mild pitting, slight indentation  Mental Status: Normal mood and affect. Normal behavior. Normal judgment and thought content.   Urinalysis:      Assessment and Plan:  Pregnancy: G1P1001 at [redacted]w[redacted]d  There are no diagnoses linked to this encounter. Term labor symptoms and general obstetric precautions including but not limited to vaginal bleeding, contractions, leaking of fluid and fetal  movement were reviewed in detail with the patient. Please refer to After Visit Summary for other counseling recommendations.  Return in about 1 week (around 06/30/2016) for LOB.   Waldemar Dickens, MD

## 2016-06-28 NOTE — Discharge Summary (Signed)
OB Discharge Summary     Patient Name: Nichole Bowers DOB: Nov 02, 1991 MRN: [id number]  Date of admission: 06/25/2016 Delivering MD: Verita Schneiders A   Date of discharge: 06/28/2016  Admitting diagnosis: 39 WEEKS CTX Intrauterine pregnancy: [redacted]w[redacted]d     Secondary diagnosis:  Principal Problem:   S/P cesarean section for failure of cervical dilation Active Problems:   Rh negative state in antepartum period   Failure of cervical dilation  Additional problems: presumed drug associated rash, see clinical image     Discharge diagnosis: Term Pregnancy Delivered                                                                                                Post partum procedures:rhogam  Augmentation: Pitocin  Complications: None  Hospital course:  Onset of Labor With Unplanned C/S  25 y.o. yo G1P1001 at [redacted]w[redacted]d was admitted in Latent Labor on 06/25/2016. Patient had a labor course significant for cervical edema, amnioinfusion, FTP. Membrane Rupture Time/Date: 4:44 AM ,06/25/2016   The patient went for cesarean section due to Arrest of Dilation, and delivered a Viable infant,06/25/2016  Details of operation can be found in separate operative note. Patient had an uncomplicated postpartum course.  She is ambulating,tolerating a regular diet, passing flatus, and urinating well.  Patient is discharged home in stable condition 06/28/16.   Physical exam Vitals:   06/27/16 0037 06/27/16 0559 06/27/16 1804 06/28/16 0519  BP:  108/80 132/80 128/77  Pulse:  79 95 78  Resp:  20 (!) 21 18  Temp: 97.8 F (36.6 C) 98 F (36.7 C) 97.7 F (36.5 C) 98.2 F (36.8 C)  TempSrc: Oral Oral Oral Oral  SpO2:      Weight:      Height:       General: alert, cooperative and no distress Lochia: appropriate Uterine Fundus: firm Incision: Dressing is clean, dry, and intact DVT Evaluation: No evidence of DVT seen on physical exam. No significant calf/ankle edema. Extremities: 26mm papular mildly  erythematous rash on chest and all extremities, itchy Labs: Lab Results  Component Value Date   WBC 19.0 (H) 06/26/2016   HGB 10.8 (L) 06/26/2016   HCT 31.5 (L) 06/26/2016   MCV 81.8 06/26/2016   PLT 250 06/26/2016   CMP Latest Ref Rng & Units 06/08/2016  Glucose 65 - 99 mg/dL 94  BUN 7 - 25 mg/dL 5(L)  Creatinine 0.50 - 1.10 mg/dL 0.64  Sodium 135 - 146 mmol/L 137  Potassium 3.5 - 5.3 mmol/L 4.6  Chloride 98 - 110 mmol/L 104  CO2 20 - 31 mmol/L 24  Calcium 8.6 - 10.2 mg/dL 9.0  Total Protein 6.1 - 8.1 g/dL 5.9(L)  Total Bilirubin 0.2 - 1.2 mg/dL 0.3  Alkaline Phos 33 - 115 U/L 156(H)  AST 10 - 30 U/L 18  ALT 6 - 29 U/L 11    Discharge instruction: per After Visit Summary and "Baby and Me Booklet".  After visit meds:    Medication List    STOP taking these medications   acetaminophen 325 MG tablet Commonly known as:  TYLENOL  TAKE these medications   ibuprofen 600 MG tablet Commonly known as:  ADVIL,MOTRIN Take 1 tablet (600 mg total) by mouth every 6 (six) hours as needed for mild pain or moderate pain.   oxyCODONE-acetaminophen 5-325 MG tablet Commonly known as:  PERCOCET/ROXICET Take 1 tablet by mouth every 4 (four) hours as needed for severe pain.   predniSONE 20 MG tablet Commonly known as:  DELTASONE Take 2 tablets (40 mg total) by mouth daily with breakfast.       Diet: routine diet  Activity: Advance as tolerated. Pelvic rest for 6 weeks.   Outpatient follow up:6 weeks Follow up Appt:No future appointments. Follow up Visit:No Follow-up on file.  Postpartum contraception: Nexplanon  Newborn Data: Live born female  Birth Weight: 8 lb 2.9 oz (3710 g) APGAR: 2, 8  Baby Feeding: Breast Disposition:home with mother   06/28/2016 Ralene Ok, MD  OB fellow attestation I have seen and examined this patient and agree with above documentation in the resident's note.   Lupie Betschart is a 25 y.o. G1P1001 s/p pLTCS.   Pain is well  controlled.  Plan for birth control is Nexplanon.  Method of Feeding: breast  PE:  BP 128/77 (BP Location: Right Arm)   Pulse 78   Temp 98.2 F (36.8 C) (Oral)   Resp 18   Ht 5\' 5"  (1.651 m)   Wt 103 kg (227 lb)   LMP 09/15/2015 (Approximate)   SpO2 96%   Breastfeeding? Unknown   BMI 37.77 kg/m  Fundus firm   Recent Labs  06/26/16 0748  HGB 10.8*  HCT 31.5*     Plan: discharge today - postpartum care discussed - f/u clinic in 6 weeks for postpartum visit   Serita Grammes, CNM 9:16 AM  06/28/2016

## 2016-06-30 ENCOUNTER — Encounter: Payer: Medicaid Other | Admitting: Obstetrics & Gynecology

## 2016-07-02 NOTE — Progress Notes (Signed)
Post discharge chart review completed.  

## 2016-07-05 ENCOUNTER — Encounter (HOSPITAL_COMMUNITY): Payer: Self-pay | Admitting: Vascular Surgery

## 2016-07-05 ENCOUNTER — Emergency Department (HOSPITAL_COMMUNITY)
Admission: EM | Admit: 2016-07-05 | Discharge: 2016-07-05 | Disposition: A | Discharge status: Home / Self Care | Payer: Medicaid Other | Attending: Emergency Medicine | Admitting: Emergency Medicine

## 2016-07-05 ENCOUNTER — Emergency Department (HOSPITAL_COMMUNITY): Payer: Medicaid Other

## 2016-07-05 DIAGNOSIS — Z87891 Personal history of nicotine dependence: Secondary | ICD-10-CM | POA: Insufficient documentation

## 2016-07-05 DIAGNOSIS — M25512 Pain in left shoulder: Secondary | ICD-10-CM | POA: Diagnosis present

## 2016-07-05 MED ORDER — NAPROXEN 500 MG PO TABS: 500.0000 mg | ORAL_TABLET | Freq: Two times a day (BID) | ORAL | 0 refills | Status: DC

## 2016-07-05 NOTE — ED Notes (Signed)
Called pt for triage no answer 

## 2016-07-05 NOTE — ED Notes (Signed)
Patient transported to X-ray 

## 2016-07-05 NOTE — ED Notes (Signed)
Patient returned from xray.

## 2016-07-05 NOTE — ED Triage Notes (Signed)
Pt reports to the ED for eval of left shoulder pain. Denies any known injury to the shoulder. She has hx of carpal tunnel in that wrist and was told it could affect her shoulder so pt thinks it is r/t the carpal tunnel or a pulled. Pain worse with movement. Pt A&Ox4, resp e/u, and skin warm and dry.

## 2016-07-05 NOTE — ED Notes (Signed)
Declined W/C at D/C and was escorted to lobby by RN. 

## 2016-07-05 NOTE — ED Provider Notes (Signed)
[facility] DEPT Provider Note   CSN: [id number] Arrival date & time: 07/05/16  1536  First Provider Contact:   First MD Initiated Contact with Patient 07/05/16 1630      By signing my name below, I, Nichole Bowers, attest that this documentation has been prepared under the direction and in the presence of non-physician practitioner, Etta Quill, NP . Electronically Signed: Evelene Bowers, Scribe. 07/05/2016. 5:57 PM.  History   Chief Complaint Chief Complaint  Patient presents with  . Shoulder Pain    The history is provided by the patient. No language interpreter was used.  Shoulder Pain   This is a new problem. The current episode started 2 days ago. The problem occurs constantly. The problem has not changed since onset.The pain is present in the left shoulder. The pain is moderate. Associated symptoms include limited range of motion. There has been no history of extremity trauma.   HPI Comments:  Nichole Bowers is a 25 y.o. female with a history of carpal tunnel who presents to the Emergency Department complaining of moderate constant left shoulder pain x 2 days. She states she moved the wrong way 2 nights ago and started having pain after that but denies obvious injury. She reports difficulty moving the extremity due to pain. Pt has no other complaints or symptoms at this time. No alleviating factors noted. She denies h/o injury to the left shoulder.    Past Medical History:  Diagnosis Date  . Atrial fibrillation ([place])   . Cocaine use    Positive drug screen on 11/03/15  . Marijuana use    Positive drug screen on 11/03/15  . Urinary tract infection   . Ventral hernia without obstruction or gangrene     Patient Active Problem List   Diagnosis Date Noted  . Failure of cervical dilation 06/25/2016  . S/P cesarean section for failure of cervical dilation 06/25/2016  . Uterine size-date discrepancy in third trimester, antepartum 05/31/2016  . Ventral hernia without  obstruction or gangrene 05/05/2016  . Supervision of other high risk pregnancy, antepartum 12/16/2015  . Cocaine abuse complicating pregnancy [id number]  . Marijuana use 11/08/2015  . UTI in pregnancy 11/06/2015  . Rh negative state in antepartum period 11/04/2015    Past Surgical History:  Procedure Laterality Date  . CESAREAN SECTION N/A 06/25/2016   Procedure: CESAREAN SECTION;  Surgeon: Osborne Oman, MD;  Location: [facility];  Service: Obstetrics;  Laterality: N/A;  . WISDOM TOOTH EXTRACTION      OB History    Gravida Para Term Preterm AB Living   1 1 1     1    SAB TAB Ectopic Multiple Live Births         0 1       Home Medications    Prior to Admission medications   Medication Sig Start Date End Date Taking? Authorizing Provider  ibuprofen (ADVIL,MOTRIN) 600 MG tablet Take 1 tablet (600 mg total) by mouth every 6 (six) hours as needed for mild pain or moderate pain. 06/28/16   Sela Hilding, MD  oxyCODONE-acetaminophen (PERCOCET/ROXICET) 5-325 MG tablet Take 1 tablet by mouth every 4 (four) hours as needed for severe pain. 06/28/16   Sela Hilding, MD    Family History No family history on file.  Social History Social History  Substance Use Topics  . Smoking status: Former Smoker    Types: Cigarettes    Quit date: 11/19/2015  . Smokeless tobacco: Never Used  . Alcohol use No  Allergies   Phenergan [promethazine]   Review of Systems Review of Systems   Physical Exam Updated Vital Signs BP 148/88 (BP Location: Left Arm)   Pulse 65   Temp 98.3 F (36.8 C) (Oral)   Resp 16   SpO2 99%   Physical Exam  Constitutional: She is oriented to person, place, and time. She appears well-developed and well-nourished. No distress.  HENT:  Head: Normocephalic and atraumatic.  Eyes: Conjunctivae are normal.  Cardiovascular: Normal rate.   Pulmonary/Chest: Effort normal.  Musculoskeletal:  Pain in the left shoulder with active and passive  ROM  Distal pulses intact Decreased grip strength due to on going wrist pain   Neurological: She is alert and oriented to person, place, and time.  Skin: Skin is warm and dry.  Psychiatric: She has a normal mood and affect.  Nursing note and vitals reviewed.    ED Treatments / Results  DIAGNOSTIC STUDIES:  Oxygen Saturation is 99% on RA, normal by my interpretation.    COORDINATION OF CARE:  4:35 PM Discussed treatment plan with pt at bedside and pt agreed to plan.  Labs (all labs ordered are listed, but only abnormal results are displayed) Labs Reviewed - No data to display  EKG  EKG Interpretation None       Radiology Dg Shoulder Left  Result Date: 07/05/2016 CLINICAL DATA:  Left shoulder pain two days. EXAM: LEFT SHOULDER - 2+ VIEW COMPARISON:  08/30/2014 FINDINGS: The joint spaces are maintained. No acute bony findings or bone lesion. Os acromial noted. No abnormal soft tissue calcifications. The visualized lung is clear and the visualized ribs are intact. IMPRESSION: No acute bony findings. Os acromial. Electronically Signed   By: Marijo Sanes M.D.   On: 07/05/2016 17:45    Procedures Procedures (including critical care time)  Medications Ordered in ED Medications - No data to display   Initial Impression / Assessment and Plan / ED Course  I have reviewed the triage vital signs and the nursing notes.  Pertinent labs & imaging results that were available during my care of the patient were reviewed by me and considered in my medical decision making (see chart for details).  Clinical Course    Radiology results reviewed and shared with patient. No acute findings.    Final Clinical Impressions(s) / ED Diagnoses   Patient X-Ray negative for obvious fracture or dislocation.  Pt advised to follow up with orthopedics. Patient given splint while in ED, conservative therapy recommended and discussed. Patient will be discharged home & is agreeable with above plan.  Returns precautions discussed. Pt appears safe for discharge.  Final diagnoses:  Left shoulder pain    New Prescriptions New Prescriptions   No medications on file   I personally performed the services described in this documentation, which was scribed in my presence. The recorded information has been reviewed and is accurate.   Etta Quill, NP 07/05/16 1943    Sherwood Gambler, MD 07/06/16 [phone]

## 2016-07-16 ENCOUNTER — Encounter: Payer: Self-pay | Admitting: *Deleted

## 2016-08-24 ENCOUNTER — Encounter: Payer: Self-pay | Admitting: Obstetrics & Gynecology

## 2016-08-24 ENCOUNTER — Ambulatory Visit (INDEPENDENT_AMBULATORY_CARE_PROVIDER_SITE_OTHER): Payer: Medicaid Other | Admitting: Obstetrics & Gynecology

## 2016-08-24 DIAGNOSIS — Z30013 Encounter for initial prescription of injectable contraceptive: Secondary | ICD-10-CM

## 2016-08-24 DIAGNOSIS — K439 Ventral hernia without obstruction or gangrene: Secondary | ICD-10-CM

## 2016-08-24 LAB — POCT PREGNANCY, URINE: PREG TEST UR: NEGATIVE

## 2016-08-24 MED ORDER — MEDROXYPROGESTERONE ACETATE 150 MG/ML IM SUSP
150.0000 mg | INTRAMUSCULAR | Status: DC
  Administered 2016-08-24 – 2016-11-11 (×2): 150 mg via INTRAMUSCULAR

## 2016-08-24 NOTE — Progress Notes (Signed)
Subjective:     Nichole Bowers is a 25 y.o. G77P1001 female who presents for a postpartum visit. She is 8 weeks postpartum following a low cervical transverse Cesarean section. I have fully reviewed the prenatal and intrapartum course. The delivery was at 60 gestational weeks. Outcome: spontaneous vaginal delivery. Anesthesia: spinal. Postpartum course has been unremarkable. Baby's course has been unremarkable. Baby is feeding by bottle - Similac Advance. Bleeding no bleeding. Bowel function is normal. Bladder function is normal. Patient is not sexually active. Contraception method is none. Postpartum depression screening: negative.  The following portions of the patient's history were reviewed and updated as appropriate: allergies, current medications, past family history, past medical history, past social history, past surgical history and problem list.  Normal pap on 12/16/15  Review of Systems Pertinent items noted in HPI and remainder of comprehensive ROS otherwise negative.   Objective:    BP 108/74   Pulse 79   Wt 222 lb 3.2 oz (100.8 kg)   LMP 08/10/2016   Breastfeeding? No   BMI 36.98 kg/m   General:  alert and no distress   Breasts:  deferred  Lungs: clear to auscultation bilaterally  Heart:  regular rate and rhythm  Abdomen: soft, non-tender; bowel sounds normal; no masses,  no organomegaly. Incision C/D/I, no erythema.  Ventral hernia present.        Assessment:   Normal postpartum exam. Pap smear not done at today's visit.   Plan:   1. Contraception: Depo-Provera injections. Declined LARCs. 2. Follow up with [facility] regarding ventral hernia management; referral order placed. 3. Follow up in: 3 months or as needed.     Verita Schneiders, MD, [facility] Attending [place], [facility] for [employer], [place]

## 2016-08-24 NOTE — Patient Instructions (Signed)
Return to clinic for any scheduled appointments or obstetric concerns, or go to [facility] for evaluation  

## 2016-09-06 ENCOUNTER — Other Ambulatory Visit: Payer: Self-pay | Admitting: Surgery

## 2016-11-09 ENCOUNTER — Ambulatory Visit: Payer: Medicaid Other

## 2016-11-11 ENCOUNTER — Ambulatory Visit (INDEPENDENT_AMBULATORY_CARE_PROVIDER_SITE_OTHER): Payer: Medicaid Other | Admitting: *Deleted

## 2016-11-11 DIAGNOSIS — Z3042 Encounter for surveillance of injectable contraceptive: Secondary | ICD-10-CM | POA: Diagnosis not present

## 2017-01-31 ENCOUNTER — Ambulatory Visit: Payer: Medicaid Other

## 2017-06-30 IMAGING — CR DG ANKLE COMPLETE 3+V*L*
3 series · 3 of 3 positions shown · non-contrast
Comparison: None.

CLINICAL DATA: Status post trauma to the left ankle yesterday with
pain laterally.

EXAM:
LEFT ANKLE COMPLETE - 3+ VIEW

[ankle ap]
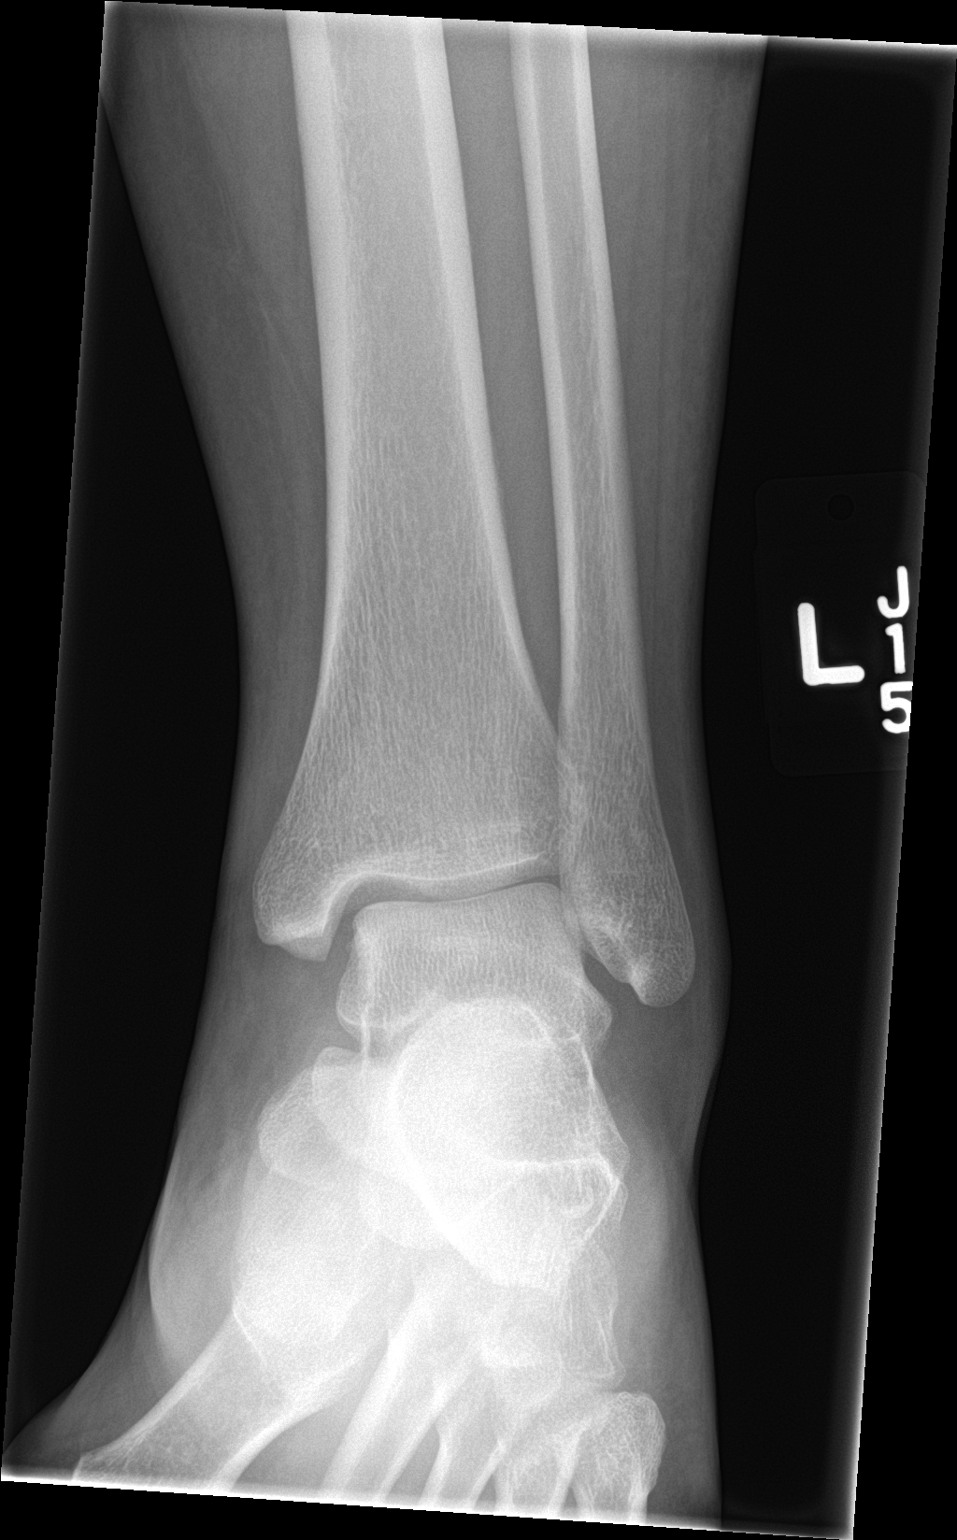

[ankle obl]
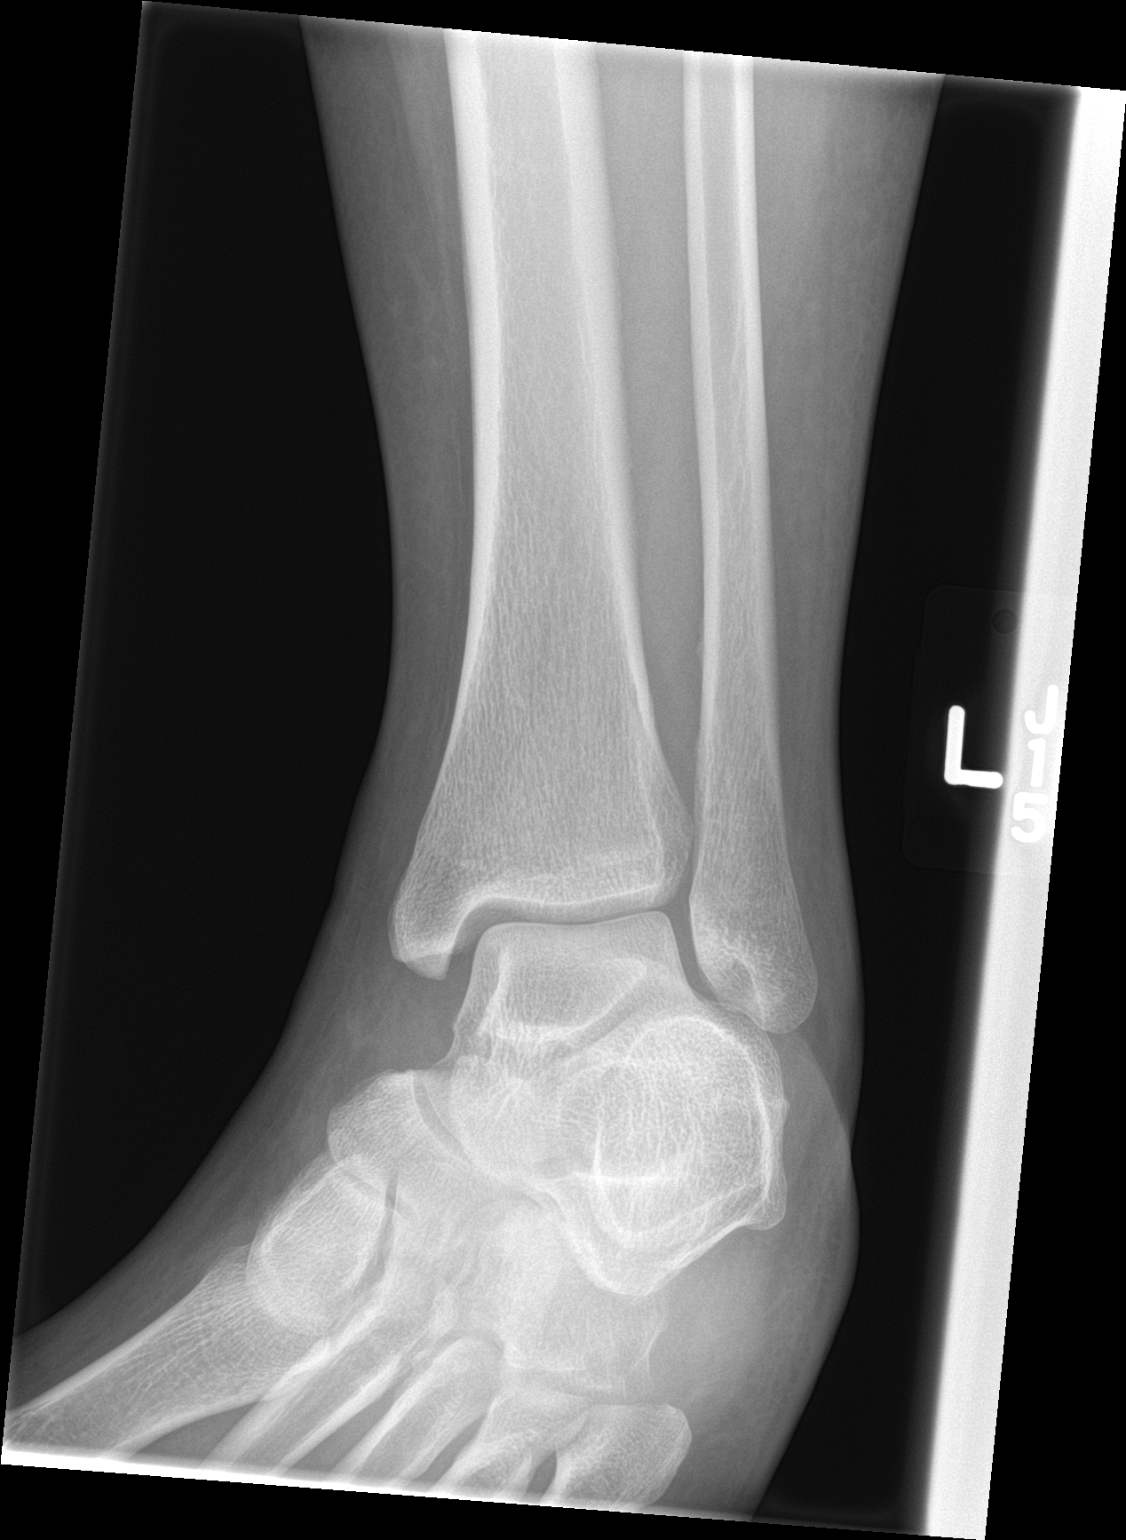

[ankle lat]
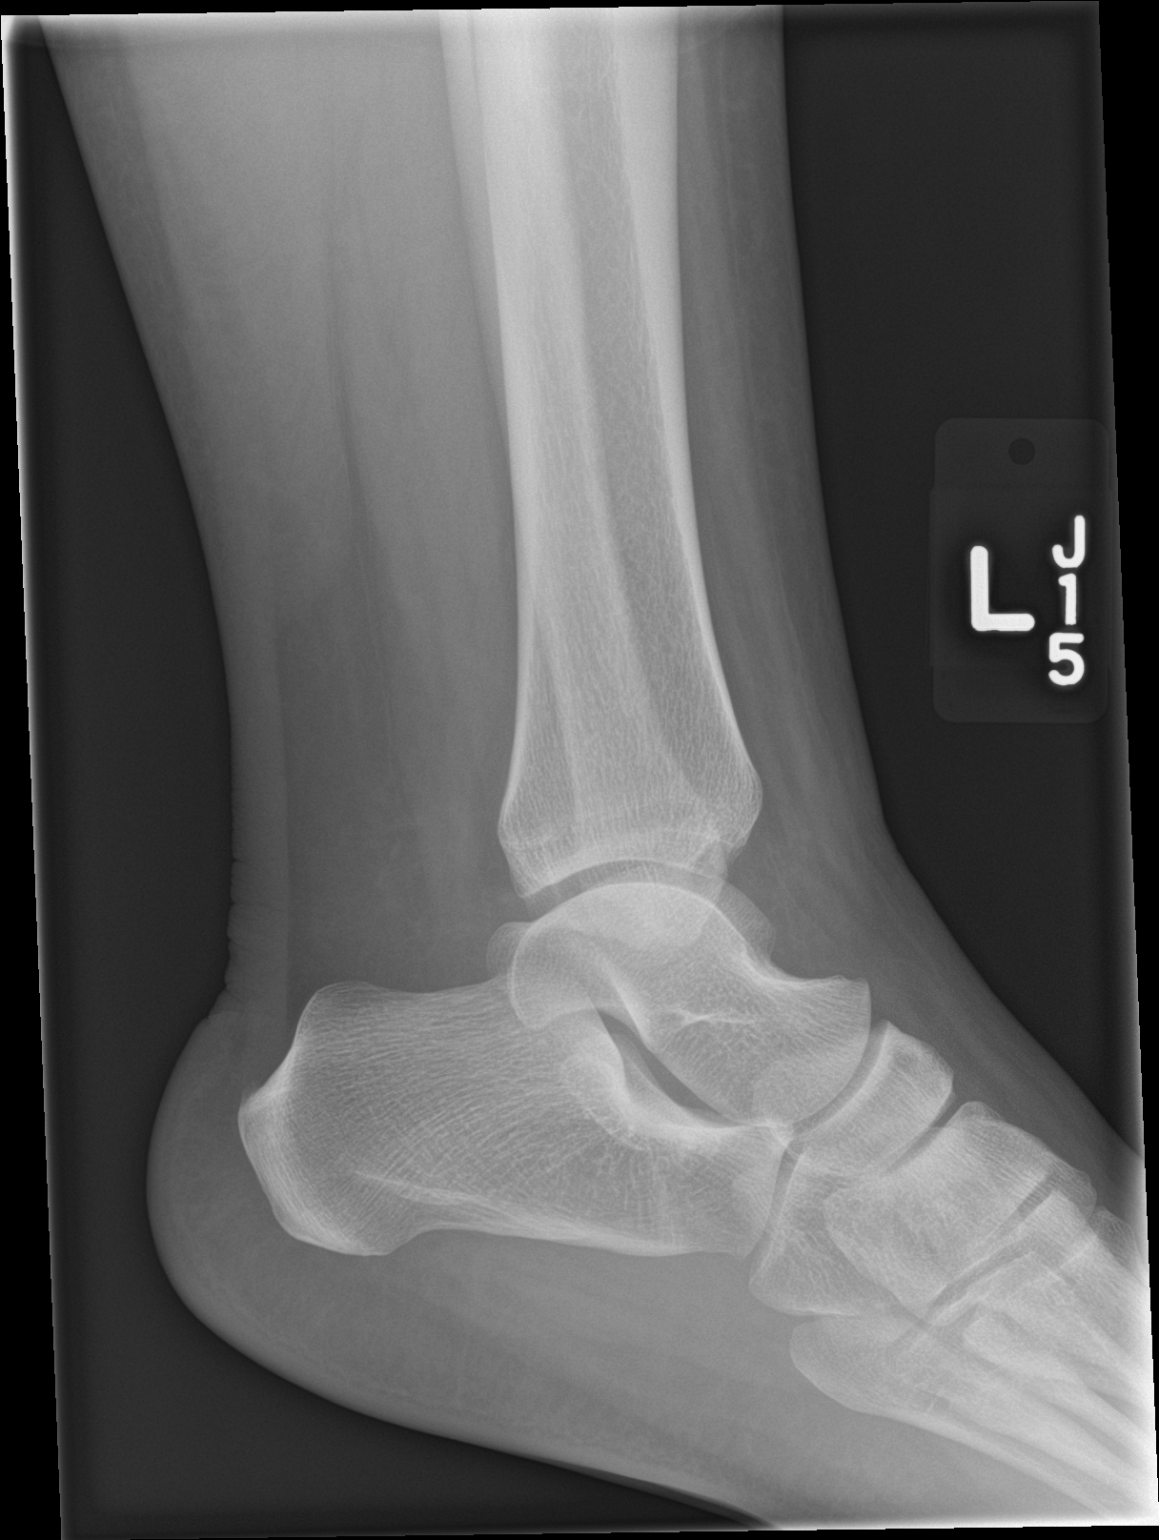

[3 of 3 positions shown; findings below may reference images not displayed]

FINDINGS: There is no evidence of fracture, dislocation, or joint effusion.
There is no evidence of arthropathy or other focal bone abnormality.
Soft tissues are unremarkable.
IMPRESSION: Negative.

## 2017-11-29 DIAGNOSIS — A749 Chlamydial infection, unspecified: Secondary | ICD-10-CM

## 2017-11-29 HISTORY — DX: Chlamydial infection, unspecified: A74.9

## 2018-01-10 ENCOUNTER — Encounter (HOSPITAL_COMMUNITY): Payer: Self-pay | Admitting: Emergency Medicine

## 2018-01-10 ENCOUNTER — Other Ambulatory Visit: Payer: Self-pay

## 2018-01-10 DIAGNOSIS — F17210 Nicotine dependence, cigarettes, uncomplicated: Secondary | ICD-10-CM | POA: Diagnosis not present

## 2018-01-10 DIAGNOSIS — Z79899 Other long term (current) drug therapy: Secondary | ICD-10-CM | POA: Insufficient documentation

## 2018-01-10 DIAGNOSIS — R509 Fever, unspecified: Secondary | ICD-10-CM | POA: Diagnosis present

## 2018-01-10 DIAGNOSIS — J1189 Influenza due to unidentified influenza virus with other manifestations: Secondary | ICD-10-CM | POA: Insufficient documentation

## 2018-01-10 LAB — CBC WITH DIFFERENTIAL/PLATELET
Basophils Absolute: 0 10*3/uL (ref 0.0–0.1)
Basophils Relative: 0 %
Eosinophils Absolute: 0.2 10*3/uL (ref 0.0–0.7)
Eosinophils Relative: 3 %
HCT: 35.2 % — ABNORMAL LOW (ref 36.0–46.0)
Hemoglobin: 11.8 g/dL — ABNORMAL LOW (ref 12.0–15.0)
LYMPHS PCT: 13 %
Lymphs Abs: 0.9 10*3/uL (ref 0.7–4.0)
MCH: 28 pg (ref 26.0–34.0)
MCHC: 33.5 g/dL (ref 30.0–36.0)
MCV: 83.6 fL (ref 78.0–100.0)
MONO ABS: 0.4 10*3/uL (ref 0.1–1.0)
MONOS PCT: 6 %
NEUTROS ABS: 5.4 10*3/uL (ref 1.7–7.7)
Neutrophils Relative %: 78 %
Platelets: 226 10*3/uL (ref 150–400)
RBC: 4.21 MIL/uL (ref 3.87–5.11)
RDW: 13.3 % (ref 11.5–15.5)
WBC: 6.9 10*3/uL (ref 4.0–10.5)

## 2018-01-10 LAB — URINALYSIS, ROUTINE W REFLEX MICROSCOPIC
Bilirubin Urine: NEGATIVE
Glucose, UA: 50 mg/dL — AB
Ketones, ur: NEGATIVE mg/dL
Nitrite: NEGATIVE
PROTEIN: 100 mg/dL — AB
Specific Gravity, Urine: 1.014 (ref 1.005–1.030)
pH: 5 (ref 5.0–8.0)

## 2018-01-10 LAB — BASIC METABOLIC PANEL
Anion gap: 10 (ref 5–15)
CALCIUM: 8.8 mg/dL — AB (ref 8.9–10.3)
CO2: 22 mmol/L (ref 22–32)
Chloride: 104 mmol/L (ref 101–111)
Creatinine, Ser: 0.87 mg/dL (ref 0.44–1.00)
GFR calc Af Amer: >60 mL/min (ref >60)
GFR calc non Af Amer: >60 mL/min (ref >60)
GLUCOSE: 104 mg/dL — AB (ref 65–99)
POTASSIUM: 3.5 mmol/L (ref 3.5–5.1)
Sodium: 136 mmol/L (ref 135–145)

## 2018-01-10 LAB — I-STAT BETA HCG BLOOD, ED (MC, WL, AP ONLY): I-stat hCG, quantitative: <5 m[IU]/mL (ref <5)

## 2018-01-10 LAB — I-STAT CG4 LACTIC ACID, ED: Lactic Acid, Venous: 1 mmol/L (ref 0.5–1.9)

## 2018-01-10 NOTE — ED Triage Notes (Signed)
Pt having flu like symptoms for the past few days with 10/10 body ache, fever, chills,  Nausea and vomiting.

## 2018-01-11 ENCOUNTER — Emergency Department (HOSPITAL_COMMUNITY)
Admission: EM | Admit: 2018-01-11 | Discharge: 2018-01-11 | Disposition: A | Discharge status: Home / Self Care | Payer: Medicaid Other | Attending: Emergency Medicine | Admitting: Emergency Medicine

## 2018-01-11 DIAGNOSIS — R6889 Other general symptoms and signs: Secondary | ICD-10-CM

## 2018-01-11 MED ORDER — OSELTAMIVIR PHOSPHATE 75 MG PO CAPS: 75.0000 mg | ORAL_CAPSULE | Freq: Two times a day (BID) | ORAL | 0 refills | Status: DC

## 2018-01-11 MED ORDER — ONDANSETRON 4 MG PO TBDP: 4.0000 mg | ORAL_TABLET | Freq: Three times a day (TID) | ORAL | 0 refills | Status: DC | PRN

## 2018-01-11 MED ORDER — IBUPROFEN 800 MG PO TABS
800.0000 mg | ORAL_TABLET | Freq: Once | ORAL | Status: AC
  Administered 2018-01-11: 800 mg via ORAL
  Filled 2018-01-11: qty 1

## 2018-01-11 NOTE — ED Provider Notes (Signed)
TIME SEEN: 3:54 AM  CHIEF COMPLAINT: Flulike symptoms  HPI: Patient is a 27 year old female with history of substance abuse who presents to the emergency department with flulike symptoms for the past 2 days.  Reports fevers, body aches, sore throat, dry cough, nasal drainage.  Did not have an influenza vaccination this year.  No sick contacts.  No nausea, vomiting or diarrhea.  No urinary symptoms.  Is currently on her menstrual cycle.  ROS: See HPI Constitutional:  fever  Eyes: no drainage  ENT:  runny nose   Cardiovascular:  no chest pain  Resp: no SOB  GI: no vomiting GU: no dysuria Integumentary: no rash  Allergy: no hives  Musculoskeletal: no leg swelling  Neurological: no slurred speech ROS otherwise negative  PAST MEDICAL HISTORY/PAST SURGICAL HISTORY:  Past Medical History:  Diagnosis Date  . Atrial fibrillation ([place])   . Cocaine use    Positive drug screen on 11/03/15  . Marijuana use    Positive drug screen on 11/03/15  . Urinary tract infection   . Ventral hernia without obstruction or gangrene     MEDICATIONS:  Prior to Admission medications   Medication Sig Start Date End Date Taking? Authorizing Provider  ibuprofen (ADVIL,MOTRIN) 600 MG tablet Take 1 tablet (600 mg total) by mouth every 6 (six) hours as needed for mild pain or moderate pain. Patient not taking: Reported on 08/24/2016 06/28/16   Sela Hilding, MD  naproxen (NAPROSYN) 500 MG tablet Take 1 tablet (500 mg total) by mouth 2 (two) times daily. Patient not taking: Reported on 08/24/2016 07/05/16   Etta Quill, NP  oxyCODONE-acetaminophen (PERCOCET/ROXICET) 5-325 MG tablet Take 1 tablet by mouth every 4 (four) hours as needed for severe pain. Patient not taking: Reported on 08/24/2016 06/28/16   Sela Hilding, MD    ALLERGIES:  Allergies  Allergen Reactions  . Phenergan [Promethazine] Nausea And Vomiting    SOCIAL HISTORY:  Social History   Tobacco Use  . Smoking status: Current Some Day  Smoker    Types: Cigarettes    Last attempt to quit: 11/19/2015    Years since quitting: 2.1  . Smokeless tobacco: Never Used  Substance Use Topics  . Alcohol use: No    FAMILY HISTORY: No family history on file.  EXAM: BP 137/85 (BP Location: Left Arm)   Pulse 79   Temp 98.7 F (37.1 C) (Oral)   Resp 18   Ht 5\' 3"  (1.6 m)   Wt 100.7 kg (222 lb)   LMP 01/07/2018   SpO2 96%   Breastfeeding? Unknown   BMI 39.33 kg/m  CONSTITUTIONAL: Alert and oriented and responds appropriately to questions. Well-appearing; well-nourished, well-hydrated, does not appear septic or toxic HEAD: Normocephalic EYES: Conjunctivae clear, pupils appear equal, EOMI ENT: normal nose; moist mucous membranes; No pharyngeal erythema or petechiae, no tonsillar hypertrophy or exudate, no uvular deviation, no unilateral swelling, no trismus or drooling, no muffled voice, normal phonation, no stridor, no dental caries present, no drainable dental abscess noted, no Ludwig's angina, tongue sits flat in the bottom of the mouth, no angioedema, no facial erythema or warmth, no facial swelling; no pain with movement of the neck. NECK: Supple, no meningismus, no nuchal rigidity, no LAD  CARD: RRR; S1 and S2 appreciated; no murmurs, no clicks, no rubs, no gallops RESP: Normal chest excursion without splinting or tachypnea; breath sounds clear and equal bilaterally; no wheezes, no rhonchi, no rales, no hypoxia or respiratory distress, speaking full sentences ABD/GI: Normal bowel sounds;  non-distended; soft, non-tender, no rebound, no guarding, no peritoneal signs, no hepatosplenomegaly BACK:  The back appears normal and is non-tender to palpation, there is no CVA tenderness EXT: Normal ROM in all joints; non-tender to palpation; no edema; normal capillary refill; no cyanosis, no calf tenderness or swelling    SKIN: Normal color for age and race; warm; no rash NEURO: Moves all extremities equally PSYCH: The patient's mood  and manner are appropriate. Grooming and personal hygiene are appropriate.  MEDICAL DECISION MAKING: Patient here with flulike symptoms.  Discussed risk and benefits of Tamiflu and she would like this medication.  I do not feel we need to do routine testing for the flu from the emergency department.  Labs are unremarkable.  Normal lactate.  Normal white count.  Lungs are clear.  I do not feel she needs a chest x-ray.  Urine appears to be contaminated from vaginal bleeding.  She is not having any urinary symptoms.  I do not think this is a UTI.  Recommended over-the-counter supportive treatment such as alternating Tylenol and Motrin and rest and increase fluid intake.  Will provide her with work note.  Have advised her to try to quarantine herself from her 68-month-old niece and 45-year-old daughter.  Both of these children have had influenza vaccination and are asymptomatic.  At this time, I do not feel there is any life-threatening condition present. I have reviewed and discussed all results (EKG, imaging, lab, urine as appropriate) and exam findings with patient/family. I have reviewed nursing notes and appropriate previous records.  I feel the patient is safe to be discharged home without further emergent workup and can continue workup as an outpatient as needed. Discussed usual and customary return precautions. Patient/family verbalize understanding and are comfortable with this plan.  Outpatient follow-up has been provided if needed. All questions have been answered.      Ward, Delice Bison, DO 01/11/18 [id number]

## 2018-01-11 NOTE — Discharge Instructions (Signed)
You may alternate Tylenol 1000 mg every 6 hours as needed for fever and pain and ibuprofen 800 mg every 8 hours as needed for fever and pain. Please rest and drink plenty of fluids. This is a viral illness causing your symptoms. You do not need antibiotics for a virus. You may use over-the-counter nasal saline spray and Afrin nasal saline spray as needed for nasal congestion. Please do not use Afrin for more than 3 days in a row. You may use Mucinex and Dextromethorphan as needed for cough.  You may use lozenges and Chloraseptic spray to help with sore throat.  Warm salt water gargles can also help with sore throat.  You may use over-the-counter Unisom (doxyalamine) or Benadryl to help with sleep.  Please note that some combination medicines such as DayQuil and NyQuil have multiple medications in them.  Please make sure you look at all labels to ensure that you are not taking too much of any one particular medication.  Symptoms from a virus may take 7-14 days to run its course.  We do not test for the flu from the emergency department as we do not have rapid flu swabs and it takes hours for this test to come back and it would not change our management. The flu is treated like any other virus with supportive measures as listed above. Tamiflu has to be taken within the first 48 hours of symptoms.  Tamiflu has many side effects including nausea, vomiting and diarrhea.    To find a primary care or specialty doctor please call [phone] or [phone] to access "[facility] a Doctor Service."  You may also go on the [facility] website at [email or web address]  There are also multiple [facility], [facility], [facility] and [facility] practices throughout the [place] that are frequently accepting new patients. You may find a clinic that is close to your home and contact them.  [place]  [id number] [address]  [place] [address] [address] [place] [phone]

## 2018-01-11 NOTE — ED Notes (Signed)
Pt to NF requesting update, informed pt of longest wait time and apologized for delay

## 2018-02-28 ENCOUNTER — Encounter: Payer: Self-pay | Admitting: *Deleted

## 2018-05-12 ENCOUNTER — Encounter (HOSPITAL_COMMUNITY): Payer: Self-pay | Admitting: Family Medicine

## 2018-05-12 ENCOUNTER — Ambulatory Visit (HOSPITAL_COMMUNITY)
Admission: EM | Admit: 2018-05-12 | Discharge: 2018-05-12 | Disposition: A | Discharge status: Home / Self Care | Payer: Medicaid Other | Attending: Family Medicine | Admitting: Family Medicine

## 2018-05-12 DIAGNOSIS — S060X0A Concussion without loss of consciousness, initial encounter: Secondary | ICD-10-CM | POA: Diagnosis not present

## 2018-05-12 MED ORDER — IBUPROFEN 800 MG PO TABS: 800.0000 mg | ORAL_TABLET | Freq: Three times a day (TID) | ORAL | 0 refills | Status: DC

## 2018-05-12 MED ORDER — CYCLOBENZAPRINE HCL 5 MG PO TABS: 5.0000 mg | ORAL_TABLET | Freq: Three times a day (TID) | ORAL | 0 refills | Status: DC | PRN

## 2018-05-12 NOTE — ED Provider Notes (Signed)
[facility]    CSN: [id number] Arrival date & time: 05/12/18  1020     History   Chief Complaint Chief Complaint  Patient presents with  . Fall  . Headache    HPI Nichole Bowers is a 27 y.o. female.   HPI  Patient is here for headache and neck pain and stiffness.  She states that 2 days ago she fell out of a chair and her mother's yard, fell backwards, and hit her head on a wooden support wall.  She did not lose conscious.  She did feel dizzy for a moment.  Her mother was there.  She laid for a moment and then decided to get up.  She was able to move around normally.  No pain in her neck.  No problems with arms or legs.  No trouble with thinking, focus, vision.  She states that the next morning she had some more neck stiffness and headache.  She went to work and worked her normal day.  Towards the end of the day when she went home she noticed increased headache.  Some "flashing lights" in front of her eyes when she blinked.  No nausea or vomiting.  No problems with ears or hearing.  No trouble with strength, ambulation, coordination, or thinking. She does have a past history of migraines in years past but none recently. She has some stiffness in her neck on the left side.  Some pain behind her left ear where her head hit the wall.  She is here for persistent symptoms.  Past Medical History:  Diagnosis Date  . Atrial fibrillation ([place])   . Cocaine use    Positive drug screen on 11/03/15  . Marijuana use    Positive drug screen on 11/03/15  . Urinary tract infection   . Ventral hernia without obstruction or gangrene     Patient Active Problem List   Diagnosis Date Noted  . Ventral hernia without obstruction or gangrene 05/05/2016  . Marijuana use 11/08/2015    Past Surgical History:  Procedure Laterality Date  . CESAREAN SECTION N/A 06/25/2016   Procedure: CESAREAN SECTION;  Surgeon: Osborne Oman, MD;  Location: [facility];  Service: Obstetrics;   Laterality: N/A;  . WISDOM TOOTH EXTRACTION      OB History    Gravida  2   Para  1   Term  1   Preterm      AB      Living  1     SAB      TAB      Ectopic      Multiple  0   Live Births  1            Home Medications    Prior to Admission medications   Medication Sig Start Date End Date Taking? Authorizing Provider  cyclobenzaprine (FLEXERIL) 5 MG tablet Take 1 tablet (5 mg total) by mouth 3 (three) times daily as needed for muscle spasms. 05/12/18   Raylene Everts, MD  ibuprofen (ADVIL,MOTRIN) 800 MG tablet Take 1 tablet (800 mg total) by mouth 3 (three) times daily. 05/12/18   Raylene Everts, MD    Family History No family history on file.  Social History Social History   Tobacco Use  . Smoking status: Current Some Day Smoker    Types: Cigarettes    Last attempt to quit: 11/19/2015    Years since quitting: 2.4  . Smokeless tobacco: Never Used  Substance  Use Topics  . Alcohol use: No  . Drug use: No    Comment: Hx marijuana and cocaine use     Allergies   Phenergan [promethazine]   Review of Systems Review of Systems  Constitutional: Negative for chills and fever.  HENT: Negative for ear pain and sore throat.   Eyes: Positive for visual disturbance. Negative for pain.  Respiratory: Negative for cough and shortness of breath.   Cardiovascular: Negative for chest pain and palpitations.  Gastrointestinal: Negative for abdominal pain and vomiting.  Endocrine: Negative for polydipsia.  Genitourinary: Negative for dysuria and hematuria.  Musculoskeletal: Positive for neck pain and neck stiffness. Negative for arthralgias, back pain and gait problem.  Skin: Negative for color change and rash.  Neurological: Positive for headaches. Negative for seizures and syncope.  All other systems reviewed and are negative.   Physical Exam Triage Vital Signs ED Triage Vitals [05/12/18 1046]  Enc Vitals Group     BP 131/80     Pulse Rate 69      Resp 18     Temp 98.5 F (36.9 C)     Temp src      SpO2 100 %     Weight      Height      Head Circumference      Peak Flow      Pain Score      Pain Loc      Pain Edu?      Excl. in [place]?    No data found.  Updated Vital Signs BP 131/80   Pulse 69   Temp 98.5 F (36.9 C)   Resp 18   LMP 05/04/2018   SpO2 100%      Physical Exam  Constitutional: She is oriented to person, place, and time. She appears well-developed and well-nourished. She does not appear ill. No distress.  HENT:  Head: Normocephalic and atraumatic.    Right Ear: External ear normal.  Left Ear: External ear normal.  Mouth/Throat: Oropharynx is clear and moist.  Eyes: Pupils are equal, round, and reactive to light. Conjunctivae and EOM are normal.  Neck: Normal range of motion. No neck rigidity.  Mild tenderness left neck muscles.  Mild tenderness to palpation behind left ear.  Pinna and canal normal.  Neck ROM full but slow  Cardiovascular: Normal rate, regular rhythm and normal heart sounds.  Pulmonary/Chest: Effort normal and breath sounds normal. No respiratory distress.  Abdominal: Soft. She exhibits no distension.  Musculoskeletal: Normal range of motion. She exhibits no edema.  Lymphadenopathy:    She has no cervical adenopathy.  Neurological: She is alert and oriented to person, place, and time. She has normal strength. She displays normal reflexes. Gait abnormal. Coordination normal.  Skin: Skin is warm and dry.  Psychiatric: She has a normal mood and affect. Her behavior is normal.     UC Treatments / Results  Labs (all labs ordered are listed, but only abnormal results are displayed) Labs Reviewed - No data to display  EKG None  Radiology No results found.  Procedures Procedures (including critical care time)  Medications Ordered in UC Medications - No data to display  Initial Impression / Assessment and Plan / UC Course  I have reviewed the triage vital signs and the  nursing notes.  Pertinent labs & imaging results that were available during my care of the patient were reviewed by me and considered in my medical decision making (see chart for details).  Discussed with her head it is not uncommon to have headaches for short period of time.  She could be having as the flashing lights as a recurring migraine.  People will get migraines after mild concussion.  She also has muscle tension and muscle tension headaches are quite common.  I am certain she does not have any head injury that requires CT, no injury to the brain.  Discussed conservative treatment.  Discussed reasons for return. Final Clinical Impressions(s) / UC Diagnoses   Final diagnoses:  Concussion without loss of consciousness, initial encounter     Discharge Instructions     Rest Ice to neck Take the ibuprofen 3 times a day with food.  This is for pain and inflammation. Take the Flexeril as needed for muscle relaxer.  You may take up to 3 times a day.  This can cause drowsiness. Rest.  Avoid heavy lifting pushing and pulling. Off work until Monday follow-up If pain persists, or unable to resume work by Monday    ED Prescriptions    Medication Sig Dispense Auth. Provider   ibuprofen (ADVIL,MOTRIN) 800 MG tablet Take 1 tablet (800 mg total) by mouth 3 (three) times daily. 21 tablet Raylene Everts, MD   cyclobenzaprine (FLEXERIL) 5 MG tablet Take 1 tablet (5 mg total) by mouth 3 (three) times daily as needed for muscle spasms. 30 tablet Raylene Everts, MD     Controlled Substance Prescriptions [place] Controlled Substance Registry consulted? Not Applicable   Raylene Everts, MD 05/12/18 [facility]

## 2018-05-12 NOTE — ED Triage Notes (Signed)
Pt here for fall out of a chair a few days ago hitting head. She reports pain in the left side of her head and neck. sts taking ibuprofen without relief.

## 2018-05-12 NOTE — Discharge Instructions (Addendum)
Rest Ice to neck Take the ibuprofen 3 times a day with food.  This is for pain and inflammation. Take the Flexeril as needed for muscle relaxer.  You may take up to 3 times a day.  This can cause drowsiness. Rest.  Avoid heavy lifting pushing and pulling. Off work until Monday follow-up If pain persists, or unable to resume work by Monday

## 2018-10-29 ENCOUNTER — Other Ambulatory Visit: Payer: Self-pay

## 2018-10-29 ENCOUNTER — Encounter (HOSPITAL_COMMUNITY): Payer: Self-pay

## 2018-10-29 ENCOUNTER — Emergency Department (HOSPITAL_COMMUNITY)
Admission: EM | Admit: 2018-10-29 | Discharge: 2018-10-29 | Disposition: A | Discharge status: Home / Self Care | Payer: Medicaid Other | Attending: Emergency Medicine | Admitting: Emergency Medicine

## 2018-10-29 DIAGNOSIS — N3000 Acute cystitis without hematuria: Secondary | ICD-10-CM | POA: Diagnosis not present

## 2018-10-29 DIAGNOSIS — F17210 Nicotine dependence, cigarettes, uncomplicated: Secondary | ICD-10-CM | POA: Diagnosis not present

## 2018-10-29 DIAGNOSIS — R1013 Epigastric pain: Secondary | ICD-10-CM | POA: Diagnosis present

## 2018-10-29 DIAGNOSIS — Z79899 Other long term (current) drug therapy: Secondary | ICD-10-CM | POA: Diagnosis not present

## 2018-10-29 DIAGNOSIS — R110 Nausea: Secondary | ICD-10-CM

## 2018-10-29 LAB — URINALYSIS, ROUTINE W REFLEX MICROSCOPIC
Bilirubin Urine: NEGATIVE
GLUCOSE, UA: NEGATIVE mg/dL
Hgb urine dipstick: NEGATIVE
KETONES UR: NEGATIVE mg/dL
Nitrite: NEGATIVE
PH: 6 (ref 5.0–8.0)
Protein, ur: NEGATIVE mg/dL
SPECIFIC GRAVITY, URINE: 1.014 (ref 1.005–1.030)

## 2018-10-29 LAB — COMPREHENSIVE METABOLIC PANEL
ALK PHOS: 34 U/L — AB (ref 38–126)
ALT: 15 U/L (ref 0–44)
AST: 18 U/L (ref 15–41)
Albumin: 3.9 g/dL (ref 3.5–5.0)
Anion gap: 8 (ref 5–15)
BUN: 6 mg/dL (ref 6–20)
CALCIUM: 8.9 mg/dL (ref 8.9–10.3)
CO2: 25 mmol/L (ref 22–32)
CREATININE: 0.8 mg/dL (ref 0.44–1.00)
Chloride: 103 mmol/L (ref 98–111)
GFR calc non Af Amer: >60 mL/min (ref >60)
Glucose, Bld: 95 mg/dL (ref 70–99)
Potassium: 3.8 mmol/L (ref 3.5–5.1)
SODIUM: 136 mmol/L (ref 135–145)
Total Bilirubin: 0.6 mg/dL (ref 0.3–1.2)
Total Protein: 7.7 g/dL (ref 6.5–8.1)

## 2018-10-29 LAB — LIPASE, BLOOD: Lipase: 34 U/L (ref 11–51)

## 2018-10-29 LAB — CBC
HCT: 39.2 % (ref 36.0–46.0)
Hemoglobin: 12.3 g/dL (ref 12.0–15.0)
MCH: 27.2 pg (ref 26.0–34.0)
MCHC: 31.4 g/dL (ref 30.0–36.0)
MCV: 86.7 fL (ref 80.0–100.0)
NRBC: 0 % (ref 0.0–0.2)
PLATELETS: 286 10*3/uL (ref 150–400)
RBC: 4.52 MIL/uL (ref 3.87–5.11)
RDW: 13.7 % (ref 11.5–15.5)
WBC: 6.7 10*3/uL (ref 4.0–10.5)

## 2018-10-29 LAB — I-STAT BETA HCG BLOOD, ED (MC, WL, AP ONLY): I-stat hCG, quantitative: >2000 m[IU]/mL — ABNORMAL HIGH (ref <5)

## 2018-10-29 MED ORDER — SODIUM CHLORIDE 0.9 % IV BOLUS
1000.0000 mL | Freq: Once | INTRAVENOUS | Status: AC
  Administered 2018-10-29: 1000 mL via INTRAVENOUS

## 2018-10-29 MED ORDER — ONDANSETRON 4 MG PO TBDP: ORAL_TABLET | ORAL | 0 refills | Status: DC

## 2018-10-29 MED ORDER — CEPHALEXIN 500 MG PO CAPS: 500.0000 mg | ORAL_CAPSULE | Freq: Three times a day (TID) | ORAL | 0 refills | Status: DC

## 2018-10-29 MED ORDER — ONDANSETRON HCL 4 MG/2ML IJ SOLN
4.0000 mg | Freq: Once | INTRAMUSCULAR | Status: AC
  Administered 2018-10-29: 4 mg via INTRAVENOUS
  Filled 2018-10-29: qty 2

## 2018-10-29 MED ORDER — CEPHALEXIN 250 MG PO CAPS
500.0000 mg | ORAL_CAPSULE | Freq: Once | ORAL | Status: AC
  Administered 2018-10-29: 500 mg via ORAL
  Filled 2018-10-29: qty 2

## 2018-10-29 NOTE — ED Notes (Addendum)
Pt verbalized understanding of d/c instructions and has no further questions, VSS ,NAD. Pt is going to follow up with [facility] for prenatal care.

## 2018-10-29 NOTE — ED Triage Notes (Signed)
Pt endorses abd pain with n/v x 6 days. LMP October. VSS.

## 2018-10-29 NOTE — ED Notes (Signed)
Pt up to use restroom without assistance. States that abd pain and nausea is better now.

## 2018-10-29 NOTE — Discharge Instructions (Addendum)
Follow-up with your OB/GYN doctor.  Return if problems

## 2018-10-29 NOTE — ED Provider Notes (Signed)
[facility] EMERGENCY DEPARTMENT Provider Note   CSN: [id number] Arrival date & time: 10/29/18  0854     History   Chief Complaint Chief Complaint  Patient presents with  . Abdominal Pain    HPI Nichole Bowers is a 27 y.o. female.  Patient complains of some nausea and epigastric discomfort.  Patient states she missed her last period  The history is provided by the patient. No language interpreter was used.  Illness  This is a new problem. The current episode started more than 2 days ago. The problem occurs constantly. The problem has not changed since onset.Pertinent negatives include no chest pain, no abdominal pain and no headaches. Nothing aggravates the symptoms. Nothing relieves the symptoms. She has tried nothing for the symptoms. The treatment provided no relief.    Past Medical History:  Diagnosis Date  . Atrial fibrillation ([place])   . Cocaine use    Positive drug screen on 11/03/15  . Marijuana use    Positive drug screen on 11/03/15  . Urinary tract infection   . Ventral hernia without obstruction or gangrene     Patient Active Problem List   Diagnosis Date Noted  . Ventral hernia without obstruction or gangrene 05/05/2016  . Marijuana use 11/08/2015    Past Surgical History:  Procedure Laterality Date  . CESAREAN SECTION N/A 06/25/2016   Procedure: CESAREAN SECTION;  Surgeon: Osborne Oman, MD;  Location: [facility];  Service: Obstetrics;  Laterality: N/A;  . WISDOM TOOTH EXTRACTION       OB History    Gravida  2   Para  1   Term  1   Preterm      AB      Living  1     SAB      TAB      Ectopic      Multiple  0   Live Births  1            Home Medications    Prior to Admission medications   Medication Sig Start Date End Date Taking? Authorizing Provider  cephALEXin (KEFLEX) 500 MG capsule Take 1 capsule (500 mg total) by mouth 3 (three) times daily. 10/29/18   Milton Ferguson, MD  cyclobenzaprine  (FLEXERIL) 5 MG tablet Take 1 tablet (5 mg total) by mouth 3 (three) times daily as needed for muscle spasms. 05/12/18   Raylene Everts, MD  ibuprofen (ADVIL,MOTRIN) 800 MG tablet Take 1 tablet (800 mg total) by mouth 3 (three) times daily. 05/12/18   Raylene Everts, MD    Family History History reviewed. No pertinent family history.  Social History Social History   Tobacco Use  . Smoking status: Current Some Day Smoker    Types: Cigarettes    Last attempt to quit: 11/19/2015    Years since quitting: 2.9  . Smokeless tobacco: Never Used  Substance Use Topics  . Alcohol use: No  . Drug use: No    Comment: Hx marijuana and cocaine use     Allergies   Phenergan [promethazine]   Review of Systems Review of Systems  Constitutional: Negative for appetite change and fatigue.  HENT: Negative for congestion, ear discharge and sinus pressure.   Eyes: Negative for discharge.  Respiratory: Negative for cough.   Cardiovascular: Negative for chest pain.  Gastrointestinal: Positive for nausea. Negative for abdominal pain and diarrhea.  Genitourinary: Negative for frequency and hematuria.  Musculoskeletal: Negative for back pain.  Skin:  Negative for rash.  Neurological: Negative for seizures and headaches.  Psychiatric/Behavioral: Negative for hallucinations.     Physical Exam Updated Vital Signs BP 126/66   Pulse (!) 58   Temp 98.5 F (36.9 C) (Oral)   Resp 17   Ht 5\' 3"  (1.6 m)   Wt 97.5 kg   LMP 09/12/2018 (Approximate)   SpO2 100%   Breastfeeding? No   BMI 38.09 kg/m   Physical Exam  Constitutional: She is oriented to person, place, and time. She appears well-developed.  HENT:  Head: Normocephalic.  Eyes: Conjunctivae and EOM are normal. No scleral icterus.  Neck: Neck supple. No thyromegaly present.  Cardiovascular: Normal rate and regular rhythm. Exam reveals no gallop and no friction rub.  No murmur heard. Pulmonary/Chest: No stridor. She has no wheezes.  She has no rales. She exhibits no tenderness.  Abdominal: She exhibits no distension. There is tenderness. There is no rebound.  Minimal epigastric tenderness  Musculoskeletal: Normal range of motion. She exhibits no edema.  Lymphadenopathy:    She has no cervical adenopathy.  Neurological: She is oriented to person, place, and time. She exhibits normal muscle tone. Coordination normal.  Skin: No rash noted. No erythema.  Psychiatric: She has a normal mood and affect. Her behavior is normal.     ED Treatments / Results  Labs (all labs ordered are listed, but only abnormal results are displayed) Labs Reviewed  COMPREHENSIVE METABOLIC PANEL - Abnormal; Notable for the following components:      Result Value   Alkaline Phosphatase 34 (*)    All other components within normal limits  URINALYSIS, ROUTINE W REFLEX MICROSCOPIC - Abnormal; Notable for the following components:   APPearance HAZY (*)    Leukocytes, UA LARGE (*)    Bacteria, UA RARE (*)    All other components within normal limits  I-STAT BETA HCG BLOOD, ED ([facility], [facility], [facility] ONLY) - Abnormal; Notable for the following components:   I-stat hCG, quantitative >2,000.0 (*)    All other components within normal limits  URINE CULTURE  LIPASE, BLOOD  CBC    EKG None  Radiology No results found.  Procedures Procedures (including critical care time)  Medications Ordered in ED Medications  cephALEXin (KEFLEX) capsule 500 mg (has no administration in time range)  ondansetron (ZOFRAN) injection 4 mg (4 mg Intravenous Given 10/29/18 0917)  sodium chloride 0.9 % bolus 1,000 mL (0 mLs Intravenous Stopped 10/29/18 1018)     Initial Impression / Assessment and Plan / ED Course  I have reviewed the triage vital signs and the nursing notes.  Pertinent labs & imaging results that were available during my care of the patient were reviewed by me and considered in my medical decision making (see chart for details).     Labs suggest  patient has urinary tract infection.  She will be placed on Keflex.  She is also pregnant and follow-up with OB/GYN Final Clinical Impressions(s) / ED Diagnoses   Final diagnoses:  Nausea  Acute cystitis without hematuria    ED Discharge Orders         Ordered    cephALEXin (KEFLEX) 500 MG capsule  3 times daily     10/29/18 1044           Milton Ferguson, MD 10/29/18 1046

## 2018-10-30 LAB — URINE CULTURE

## 2018-12-15 ENCOUNTER — Encounter: Payer: Self-pay | Admitting: *Deleted

## 2018-12-15 ENCOUNTER — Other Ambulatory Visit (HOSPITAL_COMMUNITY)
Admission: RE | Admit: 2018-12-15 | Discharge: 2018-12-15 | Disposition: A | Discharge status: Home / Self Care | Payer: Medicaid Other | Source: Ambulatory Visit | Attending: Family Medicine | Admitting: Family Medicine

## 2018-12-15 ENCOUNTER — Ambulatory Visit (INDEPENDENT_AMBULATORY_CARE_PROVIDER_SITE_OTHER): Payer: Medicaid Other | Admitting: *Deleted

## 2018-12-15 ENCOUNTER — Encounter: Payer: Self-pay | Admitting: Family Medicine

## 2018-12-15 VITALS — BP 129/80 | HR 74 | Wt 226.1 lb

## 2018-12-15 DIAGNOSIS — O0991 Supervision of high risk pregnancy, unspecified, first trimester: Secondary | ICD-10-CM

## 2018-12-15 DIAGNOSIS — Z98891 History of uterine scar from previous surgery: Secondary | ICD-10-CM

## 2018-12-15 DIAGNOSIS — O0990 Supervision of high risk pregnancy, unspecified, unspecified trimester: Secondary | ICD-10-CM

## 2018-12-15 DIAGNOSIS — Z3490 Encounter for supervision of normal pregnancy, unspecified, unspecified trimester: Secondary | ICD-10-CM | POA: Insufficient documentation

## 2018-12-15 MED ORDER — PRENATAL VITAMINS 0.8 MG PO TABS: 1.0000 | ORAL_TABLET | Freq: Every day | ORAL | 12 refills | Status: DC

## 2018-12-15 NOTE — Progress Notes (Signed)
New Ob intake completed and pregnancy information packet given. Labs drawn. Pt states she is not interested in breast feeding and declined flu vaccine today. Last Pap done 12/16/15.

## 2018-12-18 ENCOUNTER — Telehealth: Payer: Self-pay | Admitting: Student

## 2018-12-18 LAB — GC/CHLAMYDIA PROBE AMP (~~LOC~~) NOT AT ARMC
CHLAMYDIA, DNA PROBE: POSITIVE — AB
NEISSERIA GONORRHEA: NEGATIVE

## 2018-12-18 NOTE — Telephone Encounter (Signed)
Called the patient and informed of the change of time with appointment.

## 2018-12-19 ENCOUNTER — Telehealth: Payer: Self-pay | Admitting: *Deleted

## 2018-12-19 MED ORDER — AZITHROMYCIN 250 MG PO TABS: 1000.0000 mg | ORAL_TABLET | Freq: Once | ORAL | 0 refills | Status: AC

## 2018-12-19 NOTE — Telephone Encounter (Signed)
Called pt and informed her of +Chlamydia test result. Prescription sent to pt's pharmacy and dosage instructions were discussed. Pt advised that her partner will need treatment also and she should wait to have sex until 2 weeks after both of them have been treated. He may receive treatment from his PCP or [facility]. Pt voiced understanding. STI form completed and faxed to [facility].

## 2018-12-20 LAB — URINE CULTURE, OB REFLEX

## 2018-12-20 LAB — CULTURE, OB URINE

## 2018-12-20 NOTE — Progress Notes (Signed)
I agree with the nurses note and documentation of new ob  Jodi Criscuolo, Artist Pais, NP 12/20/2018 9:08 AM

## 2018-12-21 ENCOUNTER — Other Ambulatory Visit: Payer: Self-pay | Admitting: Obstetrics and Gynecology

## 2018-12-21 MED ORDER — AMOXICILLIN 500 MG PO CAPS: 500.0000 mg | ORAL_CAPSULE | Freq: Two times a day (BID) | ORAL | 0 refills | Status: AC

## 2018-12-21 NOTE — Progress Notes (Signed)
+   GBS in urine  RX for Amox sent to pharmacy. Patient made aware today.  Lezlie Lye, NP 12/21/2018 8:18 AM

## 2018-12-27 LAB — OBSTETRIC PANEL, INCLUDING HIV
Antibody Screen: NEGATIVE
Basophils Absolute: 0 10*3/uL (ref 0.0–0.2)
Basos: 0 %
EOS (ABSOLUTE): 0.1 10*3/uL (ref 0.0–0.4)
EOS: 1 %
HEMATOCRIT: 33.6 % — AB (ref 34.0–46.6)
HEP B S AG: NEGATIVE
HIV Screen 4th Generation wRfx: NONREACTIVE
Hemoglobin: 11.3 g/dL (ref 11.1–15.9)
IMMATURE GRANULOCYTES: 0 %
Immature Grans (Abs): 0 10*3/uL (ref 0.0–0.1)
LYMPHS: 20 %
Lymphocytes Absolute: 1 10*3/uL (ref 0.7–3.1)
MCH: 28.8 pg (ref 26.6–33.0)
MCHC: 33.6 g/dL (ref 31.5–35.7)
MCV: 86 fL (ref 79–97)
MONOCYTES: 7 %
MONOS ABS: 0.4 10*3/uL (ref 0.1–0.9)
NEUTROS PCT: 72 %
Neutrophils Absolute: 3.8 10*3/uL (ref 1.4–7.0)
Platelets: 240 10*3/uL (ref 150–450)
RBC: 3.92 x10E6/uL (ref 3.77–5.28)
RDW: 12.7 % (ref 11.7–15.4)
RPR Ser Ql: NONREACTIVE
Rh Factor: POSITIVE
Rubella Antibodies, IGG: 1.41 index (ref >0.99)
WBC: 5.3 10*3/uL (ref 3.4–10.8)

## 2018-12-27 LAB — INHERITEST(R) CF/SMA PANEL

## 2018-12-27 LAB — HEMOGLOBINOPATHY EVALUATION
Ferritin: 93 ng/mL (ref 15–150)
HGB A: 62 % — AB (ref 96.4–98.8)
HGB S: 34.2 % — AB
HGB SOLUBILITY: POSITIVE — AB
Hgb A2 Quant: 3.8 % — ABNORMAL HIGH (ref 1.8–3.2)
Hgb C: 0 %
Hgb F Quant: 0 % (ref 0.0–2.0)
Hgb Variant: 0 %

## 2018-12-28 ENCOUNTER — Ambulatory Visit (INDEPENDENT_AMBULATORY_CARE_PROVIDER_SITE_OTHER): Payer: Medicaid Other | Admitting: Advanced Practice Midwife

## 2018-12-28 ENCOUNTER — Encounter: Payer: Medicaid Other | Admitting: Obstetrics and Gynecology

## 2018-12-28 ENCOUNTER — Encounter: Payer: Self-pay | Admitting: Family Medicine

## 2018-12-28 ENCOUNTER — Other Ambulatory Visit (HOSPITAL_COMMUNITY)
Admission: RE | Admit: 2018-12-28 | Discharge: 2018-12-28 | Disposition: A | Discharge status: Home / Self Care | Payer: Medicaid Other | Source: Ambulatory Visit | Attending: Obstetrics and Gynecology | Admitting: Obstetrics and Gynecology

## 2018-12-28 ENCOUNTER — Ambulatory Visit: Payer: Self-pay | Admitting: Clinical

## 2018-12-28 VITALS — BP 111/66 | HR 83 | Wt 225.1 lb

## 2018-12-28 DIAGNOSIS — D573 Sickle-cell trait: Secondary | ICD-10-CM | POA: Insufficient documentation

## 2018-12-28 DIAGNOSIS — D571 Sickle-cell disease without crisis: Secondary | ICD-10-CM

## 2018-12-28 DIAGNOSIS — O0990 Supervision of high risk pregnancy, unspecified, unspecified trimester: Secondary | ICD-10-CM | POA: Diagnosis present

## 2018-12-28 DIAGNOSIS — Z23 Encounter for immunization: Secondary | ICD-10-CM

## 2018-12-28 DIAGNOSIS — O0992 Supervision of high risk pregnancy, unspecified, second trimester: Secondary | ICD-10-CM

## 2018-12-28 DIAGNOSIS — Z3A14 14 weeks gestation of pregnancy: Secondary | ICD-10-CM

## 2018-12-28 DIAGNOSIS — O26899 Other specified pregnancy related conditions, unspecified trimester: Secondary | ICD-10-CM

## 2018-12-28 DIAGNOSIS — R109 Unspecified abdominal pain: Secondary | ICD-10-CM

## 2018-12-28 DIAGNOSIS — O26892 Other specified pregnancy related conditions, second trimester: Secondary | ICD-10-CM

## 2018-12-28 MED ORDER — IBUPROFEN 600 MG PO TABS: 600.0000 mg | ORAL_TABLET | Freq: Four times a day (QID) | ORAL | 0 refills | Status: DC | PRN

## 2018-12-28 NOTE — BH Specialist Note (Signed)
Integrated Behavioral Health Initial Visit  MRN: [id number] Name: Nichole Bowers  Number of [place] Clinician visits:: 1/6 Session Start time: 11:58  Session End time: 12:05 Total time: 15 minutes  Type of Service: [place] Interpretor:No. Interpretor Name and Language: n/a   Warm Hand Off Completed.       SUBJECTIVE: Nichole Bowers is a 28 y.o. female accompanied by n/a Patient was referred by [name], CNM for Initial OB introduction to integrated behavioral health services . Patient reports the following symptoms/concerns: Pt states no particular concerns today.  Duration of problem: n/a; Severity of problem: n/a  OBJECTIVE: Mood: Normal and Affect: Appropriate Risk of harm to self or others: No plan to harm self or others  LIFE CONTEXT: Family and Social: - School/Work: - Self-Care: - Life Changes: Current pregnancy  GOALS ADDRESSED: Patient will: 1. Increase knowledge and/or ability of: healthy habits  2. Demonstrate ability to: Increase healthy adjustment to current life circumstances  INTERVENTIONS: Interventions utilized: Psychoeducation and/or Health Education  Standardized Assessments completed: GAD-7 and PHQ 9  ASSESSMENT: Patient currently experiencing Supervision of high risk pregnancy.   Patient may benefit from Initial OB introduction to integrated behavioral health services .  PLAN: 1. Follow up with behavioral health clinician on : As needed 2. Behavioral recommendations:  -Continue taking prenatal vitamin, as recommended by medical provider 3. Referral(s): [place] (In Clinic) 4. "From scale of 1-10, how likely are you to follow plan?": 10  Garlan Fair, LCSW  Depression screen [facility] 2/9 12/28/2018 12/15/2018 06/23/2016 12/12/2015  Decreased Interest 1 3 0 0  Down, Depressed, Hopeless 0 1 1 0  PHQ - 2 Score 1 4 1  0  Altered sleeping 1 2 3  -   Tired, decreased energy 1 2 1  -  Change in appetite 2 1 3  -  Feeling bad or failure about yourself  0 0 0 -  Trouble concentrating 0 0 0 -  Moving slowly or fidgety/restless 0 0 0 -  Suicidal thoughts 0 0 0 -  PHQ-9 Score 5 9 8  -   GAD 7 : Generalized Anxiety Score 12/28/2018 12/15/2018 06/23/2016  Nervous, Anxious, on Edge 0 1 0  Control/stop worrying 0 1 1  Worry too much - different things 1 1 1   Trouble relaxing 1 0 2  Restless 0 0 0  Easily annoyed or irritable 1 1 3   Afraid - awful might happen 0 0 0  Total GAD 7 Score 3 4 7

## 2018-12-28 NOTE — Progress Notes (Signed)
Subjective:    Nichole Bowers is being seen today for her first obstetrical visit.  This is not a planned pregnancy. She is at [redacted]w[redacted]d gestation. Her obstetrical history is significant for Hx C/S.  Good support system. Patient does not intend to breast feed. Pregnancy history fully reviewed.  Patient reports low abd pain, R>L x a few days accompanied by vaginal discharge. Hasn't tried anything for pain. Dx Chlamydia and UTI 12/15/18. Took Azithromycin and still taking course of Keflex as directed. No IC since Tx.   Review of Systems:   Review of Systems  Constitutional: Negative for appetite change, chills and fever.  Gastrointestinal: Positive for abdominal pain (Low abd, right >left). Negative for constipation, diarrhea, nausea and vomiting.  Genitourinary: Negative for dysuria, flank pain, frequency, hematuria, pelvic pain, vaginal bleeding, vaginal discharge and vaginal pain.  Musculoskeletal: Negative for back pain.    Objective:     BP 111/66   Pulse 83   Wt 225 lb 1.6 oz (102.1 kg)   LMP 09/19/2018 (Exact Date)   BMI 39.87 kg/m  Physical Exam  Constitutional: She is oriented to person, place, and time. She appears well-developed and well-nourished.  HENT:  Head: Normocephalic.  Eyes: No scleral icterus.  Cardiovascular: Normal rate and regular rhythm.  Respiratory: Effort normal and breath sounds normal.  GI: Soft. Bowel sounds are normal. She exhibits no distension. There is no abdominal tenderness. There is no rebound and no guarding.  Genitourinary:    Vulva, vagina and uterus normal.     No vaginal discharge.   Musculoskeletal:        General: No tenderness or edema.  Neurological: She is alert and oriented to person, place, and time. She has normal reflexes.  Skin: Skin is warm and dry.  Psychiatric: She has a normal mood and affect.    Maternal Exam:  Abdomen: Fundal height is 14 cm.    Introitus: Normal vulva. Normal vagina.  Vagina is negative for  discharge.  Pelvis: adequate for delivery.   Cervix: Cervix evaluated by sterile speculum exam and digital exam.     Fetal Exam Fetal Monitor Review: Mode: hand-held doppler probe.   Baseline rate: 154.         Assessment:    Pregnancy: G2P1001 Patient Active Problem List   Diagnosis Date Noted  . Sickle cell anemia ([place]) 12/28/2018  . Supervision of high risk pregnancy, antepartum 12/15/2018  . History of cesarean section 12/15/2018  . Ventral hernia without obstruction or gangrene 05/05/2016  . Marijuana use 11/08/2015       Plan:     1. Supervision of high risk pregnancy, antepartum  - [place] MFM OB DETAIL +14 WK; Future - Cytology - PAP( [place]) - Flu Vaccine QUAD 36+ mos IM - US OB Comp Less 14 Wks; Future  2. Hb-SS disease without crisis ([place]) - UC Q trimester  3. Abdominal pain affecting pregnancy  - ibuprofen (ADVIL,MOTRIN) 600 MG tablet; Take 1 tablet (600 mg total) by mouth every 6 (six) hours as needed for moderate pain. Do not use after 28 weeks  Dispense: 30 tablet; Refill: 0 - US OB Comp Less 14 Wks; Future  4. [redacted] weeks gestation of pregnancy  - US OB Comp Less 14 Wks; Future  5. GBS Bacteriuria  - Complete Keflex course - PCN in labor  6. Chlamydia in pregnancy   - Took Azithromycin as directed. No IC w/ Partner. Doesn't know if he was Tx'd, Is not speaking to him. -  TOC in 1 month   7. Vaginal discharge pregnancy  - Wet Prep  Initial labs, NIPS drawn. Prenatal vitamins. Problem list reviewed and updated. AFP3 discussed: undecided. Role of ultrasound in pregnancy discussed; fetal survey: ordered. Amniocentesis discussed: not indicated. Follow up in 4 weeks. Discussed clinic routines, schedule of care and testing, genetic screening options, involvement of students and residents under the direct supervision of APPs and doctors and presence of female providers. Pt verbalized understanding.    Nichole Bowers 12/28/2018

## 2018-12-28 NOTE — Patient Instructions (Signed)
Hemoglobinopathy Evaluation Test Why am I having this test? A hemoglobinopathy evaluation test is a blood test to check for certain blood disorders that are passed down through families (hemoglobinopathies). You may need this test if you:  Are at risk because of your ethnic background or family history.  Want to know if you may pass a gene for a blood disorder onto your child.  Have unexplained symptoms such as weakness, fatigue, yellow skin or eyes (jaundice), or pale skin, and your health care provider suspects a hemoglobinopathy.  Have abnormal results on a complete blood test (CBC) or a blood test that looks at blood cells under a microscope (peripheral smear). This test is also included in newborn screening tests that are done to look for hemoglobin disorders in newborns. What is being tested? Hemoglobin (Hgb) is a type of protein in the blood that carries oxygen. This test measures the amount of normal Hgb in your blood and checks for abnormal forms of Hgb (variants). Normal Hgb includes:  Hgb A1.  Hgb A2.  Hgb F. Common Hgb variants are:  Hgb S.  Hgb C.  Hgb E. Less common variants are:  Hgb F.  Hgb H.  Hgb [place].  Hgb D.  Hgb G.  Hgb J.  Hgb M.  Hgb [place].  Mixed variants. The hemoglobinopathy evaluation test may involve several methods to check a blood sample for hemoglobin abnormalities and variants. These may include:  A hemoglobin solubility test to check for hemoglobin S.  Hemoglobin electrophoresis.  Hemoglobin isoelectric focusing.  High performance liquid chromatography.  Capillary zone electrophoresis.  Mass spectrometry. The method used varies by lab and depends on the type of hemoglobinopathy that you are being tested for. What kind of sample is taken?     A blood sample is required for this test. It is usually collected by inserting a needle into a blood vessel. In newborns, a blood sample may be collected with a heel  stick. Tell a health care provider about:  Any blood transfusions you may have had in the past few months.  Any allergies you have.  All medicines you are taking, including vitamins, herbs, eye drops, creams, and over-the-counter medicines.  Any surgeries you have had.  Any blood disorders you have.  Whether you are pregnant or may be pregnant. How are the results reported? Your test results will report the amounts of normal and abnormal Hgb by percentage. What do the results mean? Normal Hgb results include:  Hgb A1: 95-98%.  Hgb A2: 2-3%.  Hgb F: 2% or less. A hemoglobin evaluation test may show abnormal forms, combinations, or elevations in the percentage of certain types of Hgb that are associated with certain medical conditions. These may include:  Sickle cell disease. Majority increase in Hgb S, no Hgb A1, and increased Hgb F.  Sickle cell trait. Slight decrease in Hgb A1 and about 40% increase in Hgb S.  Hemoglobin C disease. Majority increase in Hgb C and no Hgb A1.  Beta thalassemia major. Majority increase in Hgb F and little or no Hgb A1.  Beta thalassemia minor. Majority increase in Hgb A1, 4-8% increase in Hgb A2, and slightly increased Hgb F.  Hemoglobin H disease (also called alpha thalassemia). Majority increase in Hgb A1 and slight increase in Hgb H.  Hemoglobin E disease. Majority increase in Hgb E. To diagnose a hemoglobinopathy, your test results will be considered along with other tests including CBC, blood smear, reticulocyte count (the number of immature  red blood cells), iron levels, and genetic testing. Your health care provider may refer you to a hematology specialist to discuss what your individual results mean. Questions to ask your health care provider: Ask your health care provider, or the department that is doing the test:  When will my results be ready?  How will I get my results?  What other tests do I need?  What are my treatment  options?  What are my next steps? Summary  A hemoglobinopathy evaluation test is a blood test to check for certain blood disorders that are passed down through families (hemoglobinopathies).  Hemoglobin (Hgb) is a type of protein in the blood that carries oxygen. This test measures the amount of normal Hgb in your blood and checks for abnormal forms of Hgb (variants).  You may need this test if you are at risk of a hemoglobinopathy or if you have unexplained symptoms and your health care provider suspects a hemoglobinopathy. This information is not intended to replace advice given to you by your health care provider. Make sure you discuss any questions you have with your health care provider. Document Released: 12/18/2004 Document Revised: 11/25/2017 Document Reviewed: 11/25/2017 Elsevier Interactive Patient Education  2019 [employer].

## 2018-12-29 ENCOUNTER — Encounter: Payer: Medicaid Other | Admitting: Obstetrics & Gynecology

## 2018-12-29 ENCOUNTER — Encounter (HOSPITAL_COMMUNITY): Payer: Self-pay | Admitting: *Deleted

## 2018-12-29 ENCOUNTER — Inpatient Hospital Stay (HOSPITAL_COMMUNITY)
Admission: AD | Admit: 2018-12-29 | Discharge: 2018-12-29 | Disposition: A | Discharge status: Home / Self Care | Payer: Medicaid Other | Attending: Obstetrics and Gynecology | Admitting: Obstetrics and Gynecology

## 2018-12-29 ENCOUNTER — Other Ambulatory Visit: Payer: Self-pay

## 2018-12-29 DIAGNOSIS — O2342 Unspecified infection of urinary tract in pregnancy, second trimester: Secondary | ICD-10-CM | POA: Insufficient documentation

## 2018-12-29 DIAGNOSIS — Z3A14 14 weeks gestation of pregnancy: Secondary | ICD-10-CM | POA: Diagnosis not present

## 2018-12-29 DIAGNOSIS — O98312 Other infections with a predominantly sexual mode of transmission complicating pregnancy, second trimester: Secondary | ICD-10-CM | POA: Diagnosis not present

## 2018-12-29 DIAGNOSIS — Z87891 Personal history of nicotine dependence: Secondary | ICD-10-CM | POA: Diagnosis not present

## 2018-12-29 DIAGNOSIS — O0992 Supervision of high risk pregnancy, unspecified, second trimester: Secondary | ICD-10-CM

## 2018-12-29 DIAGNOSIS — Z98891 History of uterine scar from previous surgery: Secondary | ICD-10-CM

## 2018-12-29 DIAGNOSIS — B373 Candidiasis of vulva and vagina: Secondary | ICD-10-CM | POA: Insufficient documentation

## 2018-12-29 DIAGNOSIS — O23592 Infection of other part of genital tract in pregnancy, second trimester: Secondary | ICD-10-CM

## 2018-12-29 DIAGNOSIS — A5901 Trichomonal vulvovaginitis: Secondary | ICD-10-CM | POA: Insufficient documentation

## 2018-12-29 DIAGNOSIS — O0990 Supervision of high risk pregnancy, unspecified, unspecified trimester: Secondary | ICD-10-CM

## 2018-12-29 DIAGNOSIS — R109 Unspecified abdominal pain: Secondary | ICD-10-CM | POA: Diagnosis present

## 2018-12-29 HISTORY — DX: Sickle-cell trait: D57.3

## 2018-12-29 HISTORY — DX: Chlamydial infection, unspecified: A74.9

## 2018-12-29 LAB — URINALYSIS, ROUTINE W REFLEX MICROSCOPIC
BILIRUBIN URINE: NEGATIVE
Glucose, UA: NEGATIVE mg/dL
HGB URINE DIPSTICK: NEGATIVE
Ketones, ur: NEGATIVE mg/dL
Nitrite: NEGATIVE
PH: 6 (ref 5.0–8.0)
Protein, ur: NEGATIVE mg/dL
SPECIFIC GRAVITY, URINE: 1.015 (ref 1.005–1.030)

## 2018-12-29 LAB — WET PREP, GENITAL
Clue Cells Wet Prep HPF POC: NONE SEEN
SPERM: NONE SEEN

## 2018-12-29 LAB — URINALYSIS, MICROSCOPIC (REFLEX)

## 2018-12-29 MED ORDER — TRAMADOL HCL 50 MG PO TABS
50.0000 mg | ORAL_TABLET | Freq: Once | ORAL | Status: AC
  Administered 2018-12-29: 50 mg via ORAL
  Filled 2018-12-29: qty 1

## 2018-12-29 MED ORDER — METRONIDAZOLE 500 MG PO TABS
2000.0000 mg | ORAL_TABLET | Freq: Once | ORAL | Status: AC
  Administered 2018-12-29: 2000 mg via ORAL
  Filled 2018-12-29: qty 4

## 2018-12-29 MED ORDER — TERCONAZOLE 0.8 % VA CREA: 1.0000 | TOPICAL_CREAM | Freq: Every day | VAGINAL | 0 refills | Status: DC

## 2018-12-29 NOTE — MAU Note (Signed)
Having a lot of abd pain, only on the lower right.  Tossed and turned all night. Has been going on for 4 days.  Told them at dr's appt yesterday. Has an [place] on the 27th, but she can't wait, this pain is too bad.  Little pain with urination. Had been constipated real bad last night, had loose stool this morning.

## 2018-12-29 NOTE — Discharge Instructions (Signed)
Abdominal Pain During Pregnancy ° °Abdominal pain is common during pregnancy, and has many possible causes. Some causes are more serious than others, and sometimes the cause is not known. Abdominal pain can be a sign that labor is starting. It can also be caused by normal growth and stretching of muscles and ligaments during pregnancy. Always tell your health care provider if you have any abdominal pain. °Follow these instructions at home: °· Do not have sex or put anything in your vagina until your pain goes away completely. °· Get plenty of rest until your pain improves. °· Drink enough fluid to keep your urine pale yellow. °· Take over-the-counter and prescription medicines only as told by your health care provider. °· Keep all follow-up visits as told by your health care provider. This is important. °Contact a health care provider if: °· Your pain continues or gets worse after resting. °· You have lower abdominal pain that: °? Comes and goes at regular intervals. °? Spreads to your back. °? Is similar to menstrual cramps. °· You have pain or burning when you urinate. °Get help right away if: °· You have a fever or chills. °· You have vaginal bleeding. °· You are leaking fluid from your vagina. °· You are passing tissue from your vagina. °· You have vomiting or diarrhea that lasts for more than 24 hours. °· Your baby is moving less than usual. °· You feel very weak or faint. °· You have shortness of breath. °· You develop severe pain in your upper abdomen. °Summary °· Abdominal pain is common during pregnancy, and has many possible causes. °· If you experience abdominal pain during pregnancy, tell your health care provider right away. °· Follow your health care provider's home care instructions and keep all follow-up visits as directed. °This information is not intended to replace advice given to you by your health care provider. Make sure you discuss any questions you have with your health care  provider. °Document Released: 11/15/2005 Document Revised: 02/17/2017 Document Reviewed: 02/17/2017 °Elsevier Interactive Patient Education © 2019 Elsevier Inc. ° °

## 2018-12-29 NOTE — MAU Provider Note (Signed)
History     CSN: [id number]  Arrival date and time: 12/29/18 1100   First Provider Initiated Contact with Patient 12/29/18 1132      Chief Complaint  Patient presents with  . Abdominal Pain   Trinka Keshishyan is a 28 y.o. G2P1001 at [redacted]w[redacted]d who presents for Abdominal Pain.  She states the pain has been ongoing for the last 4 days and intensified last night.  Patient reports pain is "severe" and that it is of a cramping nature that is 9/10.  She endorses intermittent nausea and states that nothing relieves her symptoms, but laying on her sides worsens the pain. Patient states she took ibuprofen 600mg  last night without relief.  She states she had a "phase of constipation," but had a loose stool this morning.  Patient also endorses burning with urination and states she has not completed her Amoxicillin dosing as she has 2 days left.  Patient further clarifies that she did not pick the medication up until Monday.  Patient denies sexual intercourse in the past 72 hours and denies vaginal concerns including itching, burning, discharge, and odor. Patient does endorse completion of her Zithromax for recent CT diagnosis and denies any sexual encounters after treatment.       OB History    Gravida  2   Para  1   Term  1   Preterm      AB      Living  1     SAB      TAB      Ectopic      Multiple  0   Live Births  1           Past Medical History:  Diagnosis Date  . Atrial fibrillation ([place])   . Chlamydia 2019  . Cocaine use    Positive drug screen on 11/03/15  . Marijuana use    Positive drug screen on 11/03/15  . Sickle cell trait ([place])   . Urinary tract infection   . Ventral hernia without obstruction or gangrene     Past Surgical History:  Procedure Laterality Date  . CESAREAN SECTION N/A 06/25/2016   Procedure: CESAREAN SECTION;  Surgeon: Osborne Oman, MD;  Location: [facility];  Service: Obstetrics;  Laterality: N/A;  . HERNIA REPAIR  2017  .  WISDOM TOOTH EXTRACTION      Family History  Problem Relation Age of Onset  . Healthy Mother   . Healthy Father   . Asthma Sister   . Diabetes Maternal Grandfather   . Diabetes Paternal Grandfather     Social History   Tobacco Use  . Smoking status: Former Smoker    Types: Cigarettes    Last attempt to quit: 10/29/2018    Years since quitting: 0.1  . Smokeless tobacco: Never Used  Substance Use Topics  . Alcohol use: Not Currently  . Drug use: Yes    Types: Cocaine, Marijuana    Comment: marijuana 1/30 ;cocaine use- has been years    Allergies:  Allergies  Allergen Reactions  . Phenergan [Promethazine] Nausea And Vomiting    Facility-Administered Medications Prior to Admission  Medication Dose Route Frequency Provider Last Rate Last Dose  . medroxyPROGESTERone (DEPO-PROVERA) injection 150 mg  150 mg Intramuscular Q90 days Anyanwu, Ugonna A, MD   150 mg at 11/11/16 1511   Medications Prior to Admission  Medication Sig Dispense Refill Last Dose  . cephALEXin (KEFLEX) 500 MG capsule Take 1 capsule (500 mg total)  by mouth 3 (three) times daily. (Patient not taking: Reported on 12/15/2018) 20 capsule 0 Not Taking  . cyclobenzaprine (FLEXERIL) 5 MG tablet Take 1 tablet (5 mg total) by mouth 3 (three) times daily as needed for muscle spasms. (Patient not taking: Reported on 12/15/2018) 30 tablet 0 Not Taking  . ibuprofen (ADVIL,MOTRIN) 600 MG tablet Take 1 tablet (600 mg total) by mouth every 6 (six) hours as needed for moderate pain. Do not use after 28 weeks 30 tablet 0   . ondansetron (ZOFRAN ODT) 4 MG disintegrating tablet 4mg  ODT q4 hours prn nausea/vomit (Patient not taking: Reported on 12/15/2018) 4 tablet 0 Not Taking  . Prenatal Multivit-Min-Fe-FA (PRENATAL VITAMINS) 0.8 MG tablet Take 1 tablet by mouth daily. 30 tablet 12 Taking    Review of Systems  Constitutional: Negative for chills and fatigue.  Gastrointestinal: Positive for abdominal pain, diarrhea and nausea.  Negative for vomiting.  Genitourinary: Positive for dysuria. Negative for dyspareunia and vaginal bleeding.  Musculoskeletal: Negative for back pain.   Physical Exam   Blood pressure 117/73, pulse 97, temperature 98.1 F (36.7 C), temperature source Oral, resp. rate 18, weight 102.7 kg, last menstrual period 09/19/2018, SpO2 100 %.  Physical Exam  Constitutional: She is oriented to person, place, and time. She appears well-developed and well-nourished.  HENT:  Head: Normocephalic and atraumatic.  Eyes: Conjunctivae are normal.  Neck: Normal range of motion.  Cardiovascular: Normal rate, regular rhythm and normal heart sounds.  Respiratory: Effort normal and breath sounds normal.  GI: Soft. Bowel sounds are normal. There is abdominal tenderness.  Genitourinary: Cervix exhibits motion tenderness and discharge. Friability: Mild.    Vaginal discharge present.     No vaginal bleeding.  No bleeding in the vagina.    Genitourinary Comments: Speculum Exam: -Vaginal Vault: Pink mucosa. Moderate amt thin white frothy discharge -wet prep collected -Cervix:Pink, no lesions, cysts, or polyps.  Appears closed. No active bleeding, but discharge frothy discharge from os. -Bimanual Exam: Closed/Long/Thick Tenderness in cul de sac particular to the right side. Uterus S=D    Musculoskeletal: Normal range of motion.  Neurological: She is alert and oriented to person, place, and time.  Skin: Skin is warm and dry.  Psychiatric: She has a normal mood and affect. Her behavior is normal.    MAU Course  Procedures Results for orders placed or performed during the hospital encounter of 12/29/18 (from the past 24 hour(s))  Urinalysis, Routine w reflex microscopic     Status: Abnormal   Collection Time: 12/29/18 11:09 AM  Result Value Ref Range   Color, Urine YELLOW YELLOW   APPearance CLEAR CLEAR   Specific Gravity, Urine 1.015 1.005 - 1.030   pH 6.0 5.0 - 8.0   Glucose, UA NEGATIVE NEGATIVE mg/dL    Hgb urine dipstick NEGATIVE NEGATIVE   Bilirubin Urine NEGATIVE NEGATIVE   Ketones, ur NEGATIVE NEGATIVE mg/dL   Protein, ur NEGATIVE NEGATIVE mg/dL   Nitrite NEGATIVE NEGATIVE   Leukocytes, UA LARGE (A) NEGATIVE  Urinalysis, Microscopic (reflex)     Status: Abnormal   Collection Time: 12/29/18 11:09 AM  Result Value Ref Range   RBC / HPF 0-5 0 - 5 RBC/hpf   WBC, UA 21-50 0 - 5 WBC/hpf   Bacteria, UA FEW (A) NONE SEEN   Squamous Epithelial / LPF 11-20 0 - 5   Urine-Other TRICHOMONAS PRESENT   Wet prep, genital     Status: Abnormal   Collection Time: 12/29/18 11:46 AM  Result Value  Ref Range   Yeast Wet Prep HPF POC PRESENT (A) NONE SEEN   Trich, Wet Prep PRESENT (A) NONE SEEN   Clue Cells Wet Prep HPF POC NONE SEEN NONE SEEN   WBC, Wet Prep HPF POC MANY (A) NONE SEEN   Sperm NONE SEEN     MDM Pelvic Exam with cultures Labs: UA, Wet prep, and GC/CT  Assessment and Plan  Right Side Abdominal Pain Known UTI  -Exam findings discussed -WP pending -GC/CT deferred due to collection yesterday in clinic -Will send for [place] for abdominal and renal scan -Give Tramadol 50mg  now for pain -Will await results  Follow Up (12:03 PM) Trichomoniasis  Vaginal Candidiasis  -Wet prep returns significant for trichomoniasis and yeast -[place] cancelled  -Results discussed with patient -Informed of need for treatment today and need for partner treatment. -Discussed need to avoid sexual activity until partner treated -Rx for Terazol 3 sent to pharmacy on file -Instructed to complete amoxicillin dosing as appropriate -Encouraged to call or return to MAU if symptoms worsen or with the onset of new symptoms. -Discharged to home in stable condition   Maryann Conners MSN, CNM 12/29/2018, 11:32 AM

## 2019-01-01 ENCOUNTER — Other Ambulatory Visit: Payer: Self-pay

## 2019-01-01 ENCOUNTER — Encounter (HOSPITAL_COMMUNITY): Payer: Self-pay

## 2019-01-01 ENCOUNTER — Encounter: Payer: Self-pay | Admitting: *Deleted

## 2019-01-01 ENCOUNTER — Ambulatory Visit (HOSPITAL_COMMUNITY)
Admission: EM | Admit: 2019-01-01 | Discharge: 2019-01-01 | Disposition: A | Discharge status: Home / Self Care | Payer: Medicaid Other | Attending: Urgent Care | Admitting: Urgent Care

## 2019-01-01 DIAGNOSIS — M62830 Muscle spasm of back: Secondary | ICD-10-CM

## 2019-01-01 DIAGNOSIS — G8929 Other chronic pain: Secondary | ICD-10-CM

## 2019-01-01 DIAGNOSIS — M546 Pain in thoracic spine: Secondary | ICD-10-CM

## 2019-01-01 LAB — CYTOLOGY - PAP
Bacterial vaginitis: NEGATIVE
CHLAMYDIA, DNA PROBE: POSITIVE — AB
Candida vaginitis: POSITIVE — AB
Diagnosis: NEGATIVE
NEISSERIA GONORRHEA: NEGATIVE
TRICH (WINDOWPATH): POSITIVE — AB

## 2019-01-01 MED ORDER — CYCLOBENZAPRINE HCL 5 MG PO TABS: 5.0000 mg | ORAL_TABLET | Freq: Two times a day (BID) | ORAL | 0 refills | Status: DC | PRN

## 2019-01-01 NOTE — ED Provider Notes (Signed)
MRN: [id number] DOB: [birth year]  Subjective:   Nichole Bowers is a 28 y.o. female presenting for 1 month history of recurrent constant, sharp aching left shoulder pain. Has had mid/upper thoracic back pain. Pain radiates to her left shoulder, has difficulty carrying items, has difficulty with picking up and carrying her daughter. She has an OB at [facility] but no PCP. She is 4 months pregnant.   No current facility-administered medications for this encounter.   Current Outpatient Medications:  .  Prenatal Multivit-Min-Fe-FA (PRENATAL VITAMINS) 0.8 MG tablet, Take 1 tablet by mouth daily., Disp: 30 tablet, Rfl: 12 .  terconazole (TERAZOL 3) 0.8 % vaginal cream, Place 1 applicator vaginally at bedtime., Disp: 20 g, Rfl: 0   Allergies  Allergen Reactions  . Phenergan [Promethazine] Nausea And Vomiting    Past Medical History:  Diagnosis Date  . Atrial fibrillation ([place])   . Chlamydia 2019  . Cocaine use    Positive drug screen on 11/03/15  . Marijuana use    Positive drug screen on 11/03/15  . Sickle cell trait ([place])   . Urinary tract infection   . Ventral hernia without obstruction or gangrene      Past Surgical History:  Procedure Laterality Date  . CESAREAN SECTION N/A 06/25/2016   Procedure: CESAREAN SECTION;  Surgeon: Osborne Oman, MD;  Location: [place];  Service: Obstetrics;  Laterality: N/A;  . HERNIA REPAIR  2017  . WISDOM TOOTH EXTRACTION     ROS  Objective:   Vitals: BP 104/68 (BP Location: Right Arm)   Pulse 79   Temp 98.2 F (36.8 C)   Resp 18   Wt 225 lb (102.1 kg)   LMP 09/19/2018 (Exact Date)   SpO2 100%   BMI 39.86 kg/m   Physical Exam Constitutional:      General: She is not in acute distress.    Appearance: Normal appearance. She is well-developed. She is not ill-appearing.  HENT:     Head: Normocephalic and atraumatic.     Nose: Nose normal.     Mouth/Throat:     Mouth: Mucous membranes are moist.     Pharynx:  Oropharynx is clear.  Eyes:     General: No scleral icterus.    Extraocular Movements: Extraocular movements intact.     Pupils: Pupils are equal, round, and reactive to light.  Cardiovascular:     Rate and Rhythm: Normal rate.  Pulmonary:     Effort: Pulmonary effort is normal.  Musculoskeletal:     Cervical back: She exhibits decreased range of motion (Gentle left shoulder), tenderness and spasm.       Back:  Skin:    General: Skin is warm and dry.  Neurological:     General: No focal deficit present.     Mental Status: She is alert and oriented to person, place, and time.  Psychiatric:        Mood and Affect: Mood normal.        Behavior: Behavior normal.     Assessment and Plan :   Muscle spasm of back  Chronic left-sided thoracic back pain  Patient was very upset that she could not get an x-ray but I counseled that this is not allowed in pregnancy.  I did asked that she set up an appointment with her OB so that they could discuss medication safe in pregnancy for her severe back pain.  In the meantime she is to use menthol, get some rest and schedule massage.  Provided patient with a prescription for Flexeril and she can try this twice daily as needed.  Follow-up with OB. Counseled patient on potential for adverse effects with medications prescribed today, patient verbalized understanding. ER and return-to-clinic precautions discussed, patient verbalized understanding.    Jaynee Eagles, PA-C 01/01/19 1600

## 2019-01-01 NOTE — Discharge Instructions (Signed)
You can try menthol cream for your back pain. Schedule a massage to work on your back pain.

## 2019-01-01 NOTE — ED Triage Notes (Signed)
Pt states she has left shoulder pain about a month.

## 2019-01-03 ENCOUNTER — Encounter: Payer: Self-pay | Admitting: *Deleted

## 2019-01-03 ENCOUNTER — Telehealth: Payer: Self-pay | Admitting: *Deleted

## 2019-01-03 MED ORDER — METRONIDAZOLE 500 MG PO TABS: 2000.0000 mg | ORAL_TABLET | Freq: Once | ORAL | 0 refills | Status: AC

## 2019-01-03 NOTE — Telephone Encounter (Signed)
Called pt to inform her of results from test on 1/30 showing normal Pap, +Chlamydia, +trich and +yeast infections. Outgoing message heard stating that the call could not be completed at this time and to try again later. I was unable to leave a message. Per chart review, pt had +CT results on test performed 1/17 and received Rx for Azithromycin on 1/21. Pap performed showing +CT was performed on 1/30. She then was seen @ [facility]U on 1/31 and received Rx for Terconazole. She reported at that visit that she had taken the Azithromycin for +CT. Depending on when she took the medication, this may not be a new infection. Need to clarify w/pt when exactly she took the Azithromycin and then determine the necessary date for test of cure. Pt has active MyChart and message sent to inform her of +trich since this has not been treated. Rx for flagyl e-prescribed.

## 2019-01-15 ENCOUNTER — Inpatient Hospital Stay (HOSPITAL_COMMUNITY)
Admission: AD | Admit: 2019-01-15 | Discharge: 2019-01-15 | Disposition: A | Discharge status: Home / Self Care | Payer: Medicaid Other | Attending: Obstetrics and Gynecology | Admitting: Obstetrics and Gynecology

## 2019-01-15 ENCOUNTER — Encounter: Payer: Self-pay | Admitting: *Deleted

## 2019-01-15 ENCOUNTER — Other Ambulatory Visit: Payer: Self-pay

## 2019-01-15 DIAGNOSIS — O4692 Antepartum hemorrhage, unspecified, second trimester: Secondary | ICD-10-CM | POA: Diagnosis not present

## 2019-01-15 DIAGNOSIS — Z3A16 16 weeks gestation of pregnancy: Secondary | ICD-10-CM | POA: Insufficient documentation

## 2019-01-15 DIAGNOSIS — O98312 Other infections with a predominantly sexual mode of transmission complicating pregnancy, second trimester: Secondary | ICD-10-CM | POA: Diagnosis not present

## 2019-01-15 DIAGNOSIS — A5901 Trichomonal vulvovaginitis: Secondary | ICD-10-CM | POA: Diagnosis not present

## 2019-01-15 DIAGNOSIS — R51 Headache: Secondary | ICD-10-CM | POA: Diagnosis not present

## 2019-01-15 DIAGNOSIS — O209 Hemorrhage in early pregnancy, unspecified: Secondary | ICD-10-CM | POA: Diagnosis not present

## 2019-01-15 DIAGNOSIS — Z87891 Personal history of nicotine dependence: Secondary | ICD-10-CM | POA: Diagnosis not present

## 2019-01-15 DIAGNOSIS — O23592 Infection of other part of genital tract in pregnancy, second trimester: Secondary | ICD-10-CM

## 2019-01-15 DIAGNOSIS — O26892 Other specified pregnancy related conditions, second trimester: Secondary | ICD-10-CM

## 2019-01-15 LAB — URINALYSIS, ROUTINE W REFLEX MICROSCOPIC
Bilirubin Urine: NEGATIVE
Glucose, UA: NEGATIVE mg/dL
Hgb urine dipstick: NEGATIVE
Ketones, ur: NEGATIVE mg/dL
Nitrite: NEGATIVE
Protein, ur: NEGATIVE mg/dL
Specific Gravity, Urine: 1.015 (ref 1.005–1.030)
pH: 6.5 (ref 5.0–8.0)

## 2019-01-15 LAB — URINALYSIS, MICROSCOPIC (REFLEX)

## 2019-01-15 MED ORDER — METOCLOPRAMIDE HCL 10 MG PO TABS
10.0000 mg | ORAL_TABLET | Freq: Once | ORAL | Status: AC
  Administered 2019-01-15: 10 mg via ORAL
  Filled 2019-01-15: qty 1

## 2019-01-15 MED ORDER — METRONIDAZOLE 500 MG PO TABS: 500.0000 mg | ORAL_TABLET | Freq: Two times a day (BID) | ORAL | 0 refills | Status: DC

## 2019-01-15 MED ORDER — METOCLOPRAMIDE HCL 10 MG PO TABS: 10.0000 mg | ORAL_TABLET | Freq: Three times a day (TID) | ORAL | 0 refills | Status: DC | PRN

## 2019-01-15 MED ORDER — ACETAMINOPHEN 500 MG PO TABS
1000.0000 mg | ORAL_TABLET | Freq: Once | ORAL | Status: AC
  Administered 2019-01-15: 1000 mg via ORAL
  Filled 2019-01-15: qty 2

## 2019-01-15 NOTE — Discharge Instructions (Signed)
For future headaches: take tylenol (per package instructions) & reglan (metoclopramide)   Trichomoniasis Trichomoniasis is an STI (sexually transmitted infection) that can affect both women and men. In women, the outer area of the female genitalia (vulva) and the vagina are affected. In men, the penis is mainly affected, but the prostate and other reproductive organs can also be involved. This condition can be treated with medicine. It often has no symptoms (is asymptomatic), especially in men. What are the causes? This condition is caused by an organism called Trichomonas vaginalis. Trichomoniasis most often spreads from person to person (is contagious) through sexual contact. What increases the risk? The following factors may make you more likely to develop this condition:  Having unprotected sexual intercourse.  Having sexual intercourse with a partner who has trichomoniasis.  Having multiple sexual partners.  Having had previous trichomoniasis infections or other STIs. What are the signs or symptoms? In women, symptoms of trichomoniasis include:  Abnormal vaginal discharge that is clear, white, gray, or yellow-green and foamy and has an unusual "fishy" odor.  Itching and irritation of the vagina and vulva.  Burning or pain during urination or sexual intercourse.  Genital redness and swelling. In men, symptoms of trichomoniasis include:  Penile discharge that may be foamy or contain pus.  Pain in the penis. This may happen only when urinating.  Itching or irritation inside the penis.  Burning after urination or ejaculation. How is this diagnosed? In women, this condition may be found during a routine Pap test or physical exam. It may be found in men during a routine physical exam. Your health care provider may perform tests to help diagnose this infection, such as:  Urine tests (men and women).  The following in women: ? Testing the pH of the vagina. ? A vaginal swab  test that checks for the Trichomonas vaginalis organism. ? Testing vaginal secretions. Your health care provider may test you for other STIs, including HIV (human immunodeficiency virus). How is this treated? This condition is treated with medicine taken by mouth (orally), such as metronidazole or tinidazole to fight the infection. Your sexual partner(s) may also need to be tested and treated.  If you are a woman and you plan to become pregnant or think you may be pregnant, tell your health care provider right away. Some medicines that are used to treat the infection should not be taken during pregnancy. Your health care provider may recommend over-the-counter medicines or creams to help relieve itching or irritation. You may be tested for infection again 3 months after treatment. Follow these instructions at home:  Take and use over-the-counter and prescription medicines, including creams, only as told by your health care provider.  Do not have sexual intercourse until one week after you finish your medicine, or until your health care provider approves. Ask your health care provider when you may resume sexual intercourse.  (Women) Do not douche or wear tampons while you have the infection.  Discuss your infection with your sexual partner(s). Make sure that your partner gets tested and treated, if necessary.  Keep all follow-up visits as told by your health care provider. This is important. How is this prevented?  Use condoms every time you have sex. Using condoms correctly and consistently can help protect against STIs.  Avoid having multiple sexual partners.  Talk with your sexual partner about any symptoms that either of you may have, as well as any history of STIs.  Get tested for STIs and STDs (sexually transmitted  diseases) before you have sex. Ask your partner to do the same.  Do not have sexual contact if you have symptoms of trichomoniasis or another STI. Contact a health care  provider if:  You still have symptoms after you finish your medicine.  You develop pain in your abdomen.  You have pain when you urinate.  You have bleeding after sexual intercourse.  You develop a rash.  You feel nauseous or you vomit.  You plan to become pregnant or think you may be pregnant. Summary  Trichomoniasis is an STI (sexually transmitted infection) that can affect both women and men.  This condition often has no symptoms (is asymptomatic), especially in men.  You should not have sexual intercourse until one week after you finish your medicine, or until your health care provider approves. Ask your health care provider when you may resume sexual intercourse.  Discuss your infection with your sexual partner. Make sure that your partner gets tested and treated, if necessary. This information is not intended to replace advice given to you by your health care provider. Make sure you discuss any questions you have with your health care provider. Document Released: 05/11/2001 Document Revised: 10/08/2016 Document Reviewed: 10/08/2016 Elsevier Interactive Patient Education  2019 Else[employer] Inc[employer]      Vaginal Bleeding During Pregnancy, Second Trimester  A small amount of bleeding (spotting) from the vagina is relatively common during pregnancy. It usually stops on its own. Various things can cause spotting during pregnancy. Sometimes the bleeding is normal and is not a sign of a problem in the pregnancy. However, bleeding can also be a sign of something serious. Be sure to tell your health care provider about any vaginal bleeding right away. Some possible causes of vaginal bleeding during the second trimester include:  Infection, inflammation, or growths (polyps) on the cervix.  A condition in which the placenta partially or completely covers the opening of the cervix inside the uterus (placenta previa).  The placenta separating from the uterus (placenta abruption).  Early  (preterm) labor.  The cervix opening and thinning before pregnancy is at term and before labor starts (cervical insufficiency).  A mass of tissue developing in the uterus due to an egg being fertilized incorrectly (molar pregnancy). Follow these instructions at home: Activity  Follow instructions from your health care provider about limiting your activity. Ask what activities are safe for you.  If needed, make plans for someone to help with your regular activities.  Do not exercise or do activities that take a lot of effort unless your health care provider approves.  Do not lift anything that is heavier than 10 lb (4.5 kg), or the limit that your health care provider tells you, until he or she says that it is safe.  Do not have sex or orgasms until your health care provider says that this is safe. Medicines  Take over-the-counter and prescription medicines only as told by your health care provider.  Do not take aspirin because it can cause bleeding. General instructions  Pay attention to any changes in your symptoms.  Write down how many pads you use each day, how often you change pads, and how soaked (saturated) they are.  Do not use tampons or douche.  If you pass any tissue from your vagina, save the tissue so you can show it to your health care provider.  Keep all follow-up visits as told by your health care provider. This is important. Contact a health care provider if:  You have  vaginal bleeding during any time of your pregnancy.  You have cramps or labor pains.  You have a fever that does not get better when you take medicines. Get help right away if:  You have severe cramps in your back or abdomen.  You have contractions.  You have chills.  You pass large clots or a large amount of tissue from your vagina.  Your bleeding increases.  You feel light-headed or weak, or you faint.  You are leaking fluid or have a gush of fluid from your  vagina. Summary  Various things can cause bleeding or spotting in pregnancy.  Be sure to tell your health care provider about any vaginal bleeding right away.  Follow instructions from your health care provider about limiting your activity. Ask what activities are safe for you. This information is not intended to replace advice given to you by your health care provider. Make sure you discuss any questions you have with your health care provider. Document Released: 08/25/2005 Document Revised: 02/17/2017 Document Reviewed: 02/17/2017 Elsevier Interactive Patient Education  2019 [employer].

## 2019-01-15 NOTE — MAU Note (Signed)
Patient is a [place] para 1 at 16.6 week with one occurrence of bleeding at 0300 this am with no bleeding after.  Patient states it was through her underwear.  Patient also complains of constant pain in upper abdomen and pelvis.  No other OB or medical concerns.

## 2019-01-15 NOTE — MAU Provider Note (Signed)
Chief Complaint: Vaginal Bleeding and Abdominal Cramping   First Provider Initiated Contact with Patient 01/15/19 0959     SUBJECTIVE HPI: Nichole Bowers is a 28 y.o. G2P1001 at [redacted]w[redacted]d who presents to [facility] reporting headache, vaginal bleeding, and abdominal cramping.  Reports episode of vaginal bleeding in the middle of the night. States it was dark red blood that saturated her underwear. Bleeding has not continued. Has been having intermittent abdominal cramping & tightening. Cramping is sporadic but normally occurs while at work. Does not happen more than a few times per hour.  Has a frontal headache today. Has been having more headaches with pregnancy. Normally takes tylenol with minimal relief. Associated symptoms are blurred vision.  Denies n/v/d, constipation, dysuria, or vaginal discharge.  Was treated for chlamydia last month. Then was treated for trichomonas while in MAU on 1/31. States she vomited an hour after taking those meds. Is adamant that she has not had intercourse since being diagnosed with chlamydia last month.   Location: abdomen & head Quality: cramping & aching Severity: 7 & 8/10 on pain scale Duration: several days for abdomen & <1 day for headache Timing: intermittent abdominal pain, constant headache Modifying factors: minimal relief with tylenol. Working makes abdominal pain worse. Nothing makes headache worse Associated signs and symptoms: vaginal bleeding  Past Medical History:  Diagnosis Date  . Atrial fibrillation ([place])   . Chlamydia 2019  . Cocaine use    Positive drug screen on 11/03/15  . Marijuana use    Positive drug screen on 11/03/15  . Sickle cell trait ([place])   . Urinary tract infection   . Ventral hernia without obstruction or gangrene    OB History  Gravida Para Term Preterm AB Living  2 1 1     1   SAB TAB Ectopic Multiple Live Births        0 1    # Outcome Date GA Lbr Len/2nd Weight Sex Delivery Anes PTL Lv  2 Current            1 Term 06/25/16 [redacted]w[redacted]d  3710 g F CS-LTranv EPI  LIV     Birth Comments: cyanotic, moulding     Complications: Failure to Progress in Second Stage   Past Surgical History:  Procedure Laterality Date  . CESAREAN SECTION N/A 06/25/2016   Procedure: CESAREAN SECTION;  Surgeon: Osborne Oman, MD;  Location: [place];  Service: Obstetrics;  Laterality: N/A;  . HERNIA REPAIR  2017  . WISDOM TOOTH EXTRACTION     Social History   Socioeconomic History  . Marital status: Single    Spouse name: Not on file  . Number of children: Not on file  . Years of education: Not on file  . Highest education level: Not on file  Occupational History  . Occupation: Biochemist, clinical  Social Needs  . Financial resource strain: Not on file  . Food insecurity:    Worry: Never true    Inability: Never true  . Transportation needs:    Medical: No    Non-medical: No  Tobacco Use  . Smoking status: Former Smoker    Types: Cigarettes    Last attempt to quit: 10/29/2018    Years since quitting: 0.2  . Smokeless tobacco: Never Used  Substance and Sexual Activity  . Alcohol use: Not Currently  . Drug use: Yes    Types: Cocaine, Marijuana    Comment: marijuana 1/30 ;cocaine use- has been years  . Sexual activity: Yes  Birth control/protection: None  Lifestyle  . Physical activity:    Days per week: Not on file    Minutes per session: Not on file  . Stress: Not on file  Relationships  . Social connections:    Talks on phone: Not on file    Gets together: Not on file    Attends religious service: Not on file    Active member of club or organization: Not on file    Attends meetings of clubs or organizations: Not on file    Relationship status: Not on file  . Intimate partner violence:    Fear of current or ex partner: No    Emotionally abused: No    Physically abused: No    Forced sexual activity: No  Other Topics Concern  . Not on file  Social History Narrative  . Not on  file   Family History  Problem Relation Age of Onset  . Healthy Mother   . Healthy Father   . Asthma Sister   . Diabetes Maternal Grandfather   . Diabetes Paternal Grandfather    No current facility-administered medications on file prior to encounter.    Current Outpatient Medications on File Prior to Encounter  Medication Sig Dispense Refill  . cyclobenzaprine (FLEXERIL) 5 MG tablet Take 1 tablet (5 mg total) by mouth 2 (two) times daily as needed for muscle spasms. 30 tablet 0  . Prenatal Multivit-Min-Fe-FA (PRENATAL VITAMINS) 0.8 MG tablet Take 1 tablet by mouth daily. 30 tablet 12   Allergies  Allergen Reactions  . Phenergan [Promethazine] Nausea And Vomiting    I have reviewed patient's Past Medical Hx, Surgical Hx, Family Hx, Social Hx, medications and allergies.   Review of Systems  Constitutional: Negative.   Eyes: Positive for visual disturbance.  Gastrointestinal: Positive for abdominal pain. Negative for constipation, diarrhea, nausea and vomiting.  Genitourinary: Positive for vaginal bleeding. Negative for dysuria and vaginal discharge.  Neurological: Positive for headaches.    OBJECTIVE Patient Vitals for the past 24 hrs:  BP Temp Temp src Pulse Resp  01/15/19 1043 119/71 98.4 F (36.9 C) Oral 81 18  01/15/19 0940 110/71 98.9 F (37.2 C) - 88 17   Constitutional: Well-developed, well-nourished female in no acute distress.  Cardiovascular: normal rate & rhythm, no murmur Respiratory: normal rate and effort. Lung sounds clear throughout GI: Abd soft, non-tender, Pos BS x 4. No guarding or rebound tenderness MS: Extremities nontender, no edema, normal ROM Neurologic: Alert and oriented x 4.  GU:     SPECULUM EXAM: NEFG, physiologic discharge, no blood noted, cervix clean  BIMANUAL: No CMT. cervix closed; uterus normal size, no adnexal tenderness or masses.    LAB RESULTS Results for orders placed or performed during the hospital encounter of 01/15/19 (from  the past 24 hour(s))  Urinalysis, Routine w reflex microscopic     Status: Abnormal   Collection Time: 01/15/19  9:45 AM  Result Value Ref Range   Color, Urine YELLOW YELLOW   APPearance HAZY (A) CLEAR   Specific Gravity, Urine 1.015 1.005 - 1.030   pH 6.5 5.0 - 8.0   Glucose, UA NEGATIVE NEGATIVE mg/dL   Hgb urine dipstick NEGATIVE NEGATIVE   Bilirubin Urine NEGATIVE NEGATIVE   Ketones, ur NEGATIVE NEGATIVE mg/dL   Protein, ur NEGATIVE NEGATIVE mg/dL   Nitrite NEGATIVE NEGATIVE   Leukocytes,Ua MODERATE (A) NEGATIVE  Urinalysis, Microscopic (reflex)     Status: Abnormal   Collection Time: 01/15/19  9:45 AM  Result  Value Ref Range   RBC / HPF 0-5 0 - 5 RBC/hpf   WBC, UA 6-10 0 - 5 WBC/hpf   Bacteria, UA FEW (A) NONE SEEN   Squamous Epithelial / LPF 6-10 0 - 5   Urine-Other TRICHOMONAS PRESENT     IMAGING No results found.  MAU COURSE Orders Placed This Encounter  Procedures  . Urinalysis, Routine w reflex microscopic  . Urinalysis, Microscopic (reflex)  . Discharge patient   Meds ordered this encounter  Medications  . acetaminophen (TYLENOL) tablet 1,000 mg  . metoCLOPramide (REGLAN) tablet 10 mg  . metroNIDAZOLE (FLAGYL) 500 MG tablet    Sig: Take 1 tablet (500 mg total) by mouth 2 (two) times daily.    Dispense:  14 tablet    Refill:  0    Order Specific Question:   Supervising Provider    Answer:   Sloan Leiter [[id number]]  . metoCLOPramide (REGLAN) 10 MG tablet    Sig: Take 1 tablet (10 mg total) by mouth every 8 (eight) hours as needed (headache or nausea).    Dispense:  30 tablet    Refill:  0    Order Specific Question:   Supervising Provider    Answer:   Sloan Leiter [[id number]]    MDM FHT present via doppler. On exam, no blood & cervix closed/thick. RH positive.  Pelvic swabs not collected today since it has only been 2 weeks since positive swabs. Discussed she will have TOC at her next OB visit.  U/a shows trichomonas on micro. Since patient states she  vomited last treatment dose will rx flagyl BID x 1 week. Discussed no intercourse x 2 weeks.   Tylenol & reglan given for headache with some relief. Will rx reglan for use at home.   ASSESSMENT 1. Vaginal bleeding in pregnancy, second trimester   2. [redacted] weeks gestation of pregnancy   3. Pregnancy headache in second trimester   4. Trichomonal vaginitis during pregnancy in second trimester     PLAN Discharge home in stable condition. Bleeding precautions  Allergies as of 01/15/2019      Reactions   Phenergan [promethazine] Nausea And Vomiting      Medication List    STOP taking these medications   terconazole 0.8 % vaginal cream Commonly known as:  TERAZOL 3     TAKE these medications   cyclobenzaprine 5 MG tablet Commonly known as:  FLEXERIL Take 1 tablet (5 mg total) by mouth 2 (two) times daily as needed for muscle spasms.   metoCLOPramide 10 MG tablet Commonly known as:  REGLAN Take 1 tablet (10 mg total) by mouth every 8 (eight) hours as needed (headache or nausea).   metroNIDAZOLE 500 MG tablet Commonly known as:  FLAGYL Take 1 tablet (500 mg total) by mouth 2 (two) times daily.   Prenatal Vitamins 0.8 MG tablet Take 1 tablet by mouth daily.        Jorje Guild, NP 01/15/2019  10:56 AM

## 2019-01-25 ENCOUNTER — Ambulatory Visit (INDEPENDENT_AMBULATORY_CARE_PROVIDER_SITE_OTHER): Payer: Medicaid Other | Admitting: Obstetrics and Gynecology

## 2019-01-25 VITALS — BP 115/72 | HR 91 | Wt 229.0 lb

## 2019-01-25 DIAGNOSIS — A749 Chlamydial infection, unspecified: Secondary | ICD-10-CM | POA: Insufficient documentation

## 2019-01-25 DIAGNOSIS — A5901 Trichomonal vulvovaginitis: Secondary | ICD-10-CM | POA: Insufficient documentation

## 2019-01-25 DIAGNOSIS — Z98891 History of uterine scar from previous surgery: Secondary | ICD-10-CM

## 2019-01-25 DIAGNOSIS — O99210 Obesity complicating pregnancy, unspecified trimester: Secondary | ICD-10-CM | POA: Insufficient documentation

## 2019-01-25 DIAGNOSIS — F1290 Cannabis use, unspecified, uncomplicated: Secondary | ICD-10-CM

## 2019-01-25 DIAGNOSIS — O0992 Supervision of high risk pregnancy, unspecified, second trimester: Secondary | ICD-10-CM

## 2019-01-25 DIAGNOSIS — O99212 Obesity complicating pregnancy, second trimester: Secondary | ICD-10-CM

## 2019-01-25 DIAGNOSIS — Z3A18 18 weeks gestation of pregnancy: Secondary | ICD-10-CM

## 2019-01-25 DIAGNOSIS — O0990 Supervision of high risk pregnancy, unspecified, unspecified trimester: Secondary | ICD-10-CM

## 2019-01-25 DIAGNOSIS — O98819 Other maternal infectious and parasitic diseases complicating pregnancy, unspecified trimester: Secondary | ICD-10-CM

## 2019-01-25 DIAGNOSIS — O98812 Other maternal infectious and parasitic diseases complicating pregnancy, second trimester: Secondary | ICD-10-CM

## 2019-01-25 DIAGNOSIS — O98811 Other maternal infectious and parasitic diseases complicating pregnancy, first trimester: Secondary | ICD-10-CM

## 2019-01-25 LAB — POCT URINALYSIS DIP (DEVICE)
Bilirubin Urine: NEGATIVE
Glucose, UA: NEGATIVE mg/dL
Hgb urine dipstick: NEGATIVE
Ketones, ur: NEGATIVE mg/dL
Nitrite: NEGATIVE
Protein, ur: NEGATIVE mg/dL
Specific Gravity, Urine: 1.02 (ref 1.005–1.030)
Urobilinogen, UA: 0.2 mg/dL (ref 0.0–1.0)
pH: 6.5 (ref 5.0–8.0)

## 2019-01-25 MED ORDER — ASPIRIN EC 81 MG PO TBEC: 81.0000 mg | DELAYED_RELEASE_TABLET | Freq: Every day | ORAL | 1 refills | Status: DC

## 2019-01-25 NOTE — Progress Notes (Signed)
   PRENATAL VISIT NOTE  Subjective:  Nichole Bowers is a 28 y.o. G2P1001 at [redacted]w[redacted]d being seen today for ongoing prenatal care.  She is currently monitored for the following issues for this low-risk pregnancy and has Marijuana use; Ventral hernia without obstruction or gangrene; Supervision of high risk pregnancy, antepartum; History of cesarean section; Sickle cell anemia ([place]); Obesity in pregnancy; Chlamydia infection affecting pregnancy; and Trichomonas vaginalis (TV) infection on their problem list.  Patient reports no complaints.  Contractions: Not present. Vag. Bleeding: None.  Movement: Present. Denies leaking of fluid.   The following portions of the patient's history were reviewed and updated as appropriate: allergies, current medications, past family history, past medical history, past social history, past surgical history and problem list. Problem list updated.  Objective:   Vitals:   01/25/19 0944  BP: 115/72  Pulse: 91  Weight: 229 lb (103.9 kg)    Fetal Status: Fetal Heart Rate (bpm): 152   Movement: Present     General:  Alert, oriented and cooperative. Patient is in no acute distress.  Skin: Skin is warm and dry. No rash noted.   Cardiovascular: Normal heart rate noted  Respiratory: Normal respiratory effort, no problems with respiration noted  Abdomen: Soft, gravid, appropriate for gestational age.  Pain/Pressure: Present     Pelvic: Cervical exam deferred        Extremities: Normal range of motion.  Edema: None  Mental Status: Normal mood and affect. Normal behavior. Normal judgment and thought content.   Assessment and Plan:  Pregnancy: G2P1001 at [redacted]w[redacted]d  1. Marijuana use  - Drug Screen 12+Alcohol+CRT, Ur  2. Obesity in pregnancy  - Start BASA today and continue until 6 weeks PP - AFP, Serum, Open Spina Bifida - POCT HgB A1C  3. Supervision of high risk pregnancy, antepartum  - AFP, Serum, Open Spina Bifida - POCT HgB A1C - Cystic Fibrosis Mutation  97  4. History of cesarean section  Desires tolac, consent signed today   5. Chlamydia infection affecting pregnancy in first trimester  TOC at 3/12 visit   6. Trichomonas vaginalis (TV) infection  Continue Flagyl; patient doing 7 days because she vomited the one time dose  TOC at 3/12 visit   There are no diagnoses linked to this encounter. Preterm labor symptoms and general obstetric precautions including but not limited to vaginal bleeding, contractions, leaking of fluid and fetal movement were reviewed in detail with the patient. Please refer to After Visit Summary for other counseling recommendations.  No follow-ups on file.  Future Appointments  Date Time Provider [place]  01/30/2019 11:00 AM WH-MFC [place] 3 WH-MFCUS MFC-US  02/08/2019  9:15 AM Nichole Bowers, Nichole Pais, NP [facility] WOC    Noni Saupe, NP

## 2019-01-25 NOTE — Addendum Note (Signed)
Addended by: Alric Seton on: 01/25/2019 12:04 PM   Modules accepted: Orders

## 2019-01-27 LAB — AFP, SERUM, OPEN SPINA BIFIDA
AFP MoM: 1.47
AFP Value: 54 ng/mL
Gest. Age on Collection Date: 18 weeks
MATERNAL AGE AT EDD: 26.8 a
OSBR Risk 1 IN: 5931
Test Results:: NEGATIVE
Weight: 229 [lb_av]

## 2019-01-29 LAB — DRUG SCREEN 12+ALCOHOL+CRT, UR
Amphetamines, Urine: NEGATIVE ng/mL
BENZODIAZ UR QL: NEGATIVE ng/mL
Barbiturate: NEGATIVE ng/mL
COCAINE (METABOLITE): NEGATIVE ng/mL
Cannabinoids: POSITIVE — AB
Creatinine, Urine: 92.4 mg/dL (ref 20.0–300.0)
Ethanol, Urine: NEGATIVE %
Meperidine: NEGATIVE ng/mL
Methadone: NEGATIVE ng/mL
OPIATE SCREEN URINE: NEGATIVE ng/mL
Oxycodone/Oxymorphone, Urine: NEGATIVE ng/mL
Phencyclidine: NEGATIVE ng/mL
Propoxyphene: NEGATIVE ng/mL
Tramadol: NEGATIVE ng/mL

## 2019-01-30 ENCOUNTER — Encounter (HOSPITAL_COMMUNITY): Payer: Self-pay

## 2019-01-30 ENCOUNTER — Ambulatory Visit (HOSPITAL_COMMUNITY)
Admission: RE | Admit: 2019-01-30 | Discharge: 2019-01-30 | Disposition: A | Discharge status: Home / Self Care | Payer: Medicaid Other | Source: Ambulatory Visit | Attending: Advanced Practice Midwife | Admitting: Advanced Practice Midwife

## 2019-01-30 ENCOUNTER — Ambulatory Visit (HOSPITAL_COMMUNITY): Payer: Medicaid Other | Admitting: *Deleted

## 2019-01-30 ENCOUNTER — Other Ambulatory Visit (HOSPITAL_COMMUNITY): Payer: Self-pay | Admitting: *Deleted

## 2019-01-30 VITALS — BP 116/59 | HR 89 | Wt 231.4 lb

## 2019-01-30 DIAGNOSIS — Z363 Encounter for antenatal screening for malformations: Secondary | ICD-10-CM

## 2019-01-30 DIAGNOSIS — Z3A19 19 weeks gestation of pregnancy: Secondary | ICD-10-CM

## 2019-01-30 DIAGNOSIS — O99019 Anemia complicating pregnancy, unspecified trimester: Secondary | ICD-10-CM | POA: Insufficient documentation

## 2019-01-30 DIAGNOSIS — O34219 Maternal care for unspecified type scar from previous cesarean delivery: Secondary | ICD-10-CM

## 2019-01-30 DIAGNOSIS — O99212 Obesity complicating pregnancy, second trimester: Secondary | ICD-10-CM

## 2019-01-30 DIAGNOSIS — D573 Sickle-cell trait: Secondary | ICD-10-CM | POA: Diagnosis present

## 2019-01-30 DIAGNOSIS — O0990 Supervision of high risk pregnancy, unspecified, unspecified trimester: Secondary | ICD-10-CM | POA: Diagnosis present

## 2019-01-30 DIAGNOSIS — Z862 Personal history of diseases of the blood and blood-forming organs and certain disorders involving the immune mechanism: Secondary | ICD-10-CM | POA: Diagnosis not present

## 2019-01-30 DIAGNOSIS — O43199 Other malformation of placenta, unspecified trimester: Secondary | ICD-10-CM

## 2019-02-08 ENCOUNTER — Ambulatory Visit (INDEPENDENT_AMBULATORY_CARE_PROVIDER_SITE_OTHER): Payer: Medicaid Other | Admitting: Obstetrics and Gynecology

## 2019-02-08 ENCOUNTER — Encounter: Payer: Self-pay | Admitting: Obstetrics and Gynecology

## 2019-02-08 ENCOUNTER — Other Ambulatory Visit: Payer: Self-pay

## 2019-02-08 ENCOUNTER — Encounter: Payer: Self-pay | Admitting: Advanced Practice Midwife

## 2019-02-08 ENCOUNTER — Other Ambulatory Visit (HOSPITAL_COMMUNITY)
Admission: RE | Admit: 2019-02-08 | Discharge: 2019-02-08 | Disposition: A | Discharge status: Home / Self Care | Payer: Medicaid Other | Source: Ambulatory Visit | Attending: Obstetrics and Gynecology | Admitting: Obstetrics and Gynecology

## 2019-02-08 VITALS — BP 122/72 | HR 85 | Wt 236.0 lb

## 2019-02-08 DIAGNOSIS — A749 Chlamydial infection, unspecified: Secondary | ICD-10-CM | POA: Insufficient documentation

## 2019-02-08 DIAGNOSIS — O98811 Other maternal infectious and parasitic diseases complicating pregnancy, first trimester: Secondary | ICD-10-CM | POA: Insufficient documentation

## 2019-02-08 DIAGNOSIS — O35EXX0 Maternal care for other (suspected) fetal abnormality and damage, fetal genitourinary anomalies, not applicable or unspecified: Secondary | ICD-10-CM | POA: Insufficient documentation

## 2019-02-08 DIAGNOSIS — O43199 Other malformation of placenta, unspecified trimester: Secondary | ICD-10-CM | POA: Insufficient documentation

## 2019-02-08 DIAGNOSIS — A5901 Trichomonal vulvovaginitis: Secondary | ICD-10-CM

## 2019-02-08 DIAGNOSIS — O98812 Other maternal infectious and parasitic diseases complicating pregnancy, second trimester: Secondary | ICD-10-CM

## 2019-02-08 DIAGNOSIS — K439 Ventral hernia without obstruction or gangrene: Secondary | ICD-10-CM

## 2019-02-08 DIAGNOSIS — Z3A20 20 weeks gestation of pregnancy: Secondary | ICD-10-CM

## 2019-02-08 DIAGNOSIS — O99210 Obesity complicating pregnancy, unspecified trimester: Secondary | ICD-10-CM

## 2019-02-08 DIAGNOSIS — O358XX0 Maternal care for other (suspected) fetal abnormality and damage, not applicable or unspecified: Secondary | ICD-10-CM | POA: Insufficient documentation

## 2019-02-08 DIAGNOSIS — D573 Sickle-cell trait: Secondary | ICD-10-CM | POA: Insufficient documentation

## 2019-02-08 DIAGNOSIS — O99212 Obesity complicating pregnancy, second trimester: Secondary | ICD-10-CM

## 2019-02-08 DIAGNOSIS — O99019 Anemia complicating pregnancy, unspecified trimester: Secondary | ICD-10-CM

## 2019-02-08 MED ORDER — COMFORT FIT MATERNITY SUPP LG MISC: 1.0000 | Freq: Every day | 0 refills | Status: DC | PRN

## 2019-02-08 NOTE — Patient Instructions (Signed)
   PREGNANCY SUPPORT BELT: °You are not alone, Seventy-five percent of women have some sort of abdominal or back pain at some point in their pregnancy. Your baby is growing at a fast pace, which means that your whole body is rapidly trying to adjust to the changes. As your uterus grows, your back may start feeling a bit under stress and this can result in back or abdominal pain that can go from mild, and therefore bearable, to severe pains that will not allow you to sit or lay down comfortably, When it comes to dealing with pregnancy-related pains and cramps, some pregnant women usually prefer natural remedies, which the market is filled with nowadays. For example, wearing a pregnancy support belt can help ease and lessen your discomfort and pain. °WHAT ARE THE BENEFITS OF WEARING A PREGNANCY SUPPORT BELT? A pregnancy support belt provides support to the lower portion of the belly taking some of the weight of the growing uterus and distributing to the other parts of your body. It is designed make you comfortable and gives you extra support. Over the years, the pregnancy apparel market has been studying the needs and wants of pregnant women and they have come up with the most comfortable pregnancy support belts that woman could ever ask for. In fact, you will no longer have to wear a stretched-out or bulky pregnancy belt that is visible underneath your clothes and makes you feel even more uncomfortable. Nowadays, a pregnancy support belt is made of comfortable and stretchy materials that will not irritate your skin but will actually make you feel at ease and you will not even notice you are wearing it. They are easy to put on and adjust during the day and can be worn at night for additional support.  °BENEFITS: °• Relives Back pain °• Relieves Abdominal Muscle and Leg Pain °• Stabilizes the Pelvic Ring °• Offers a Cushioned Abdominal Lift Pad °• Relieves pressure on the Sciatic Nerve Within Minutes °WHERE TO GET  YOUR PREGNANCY BELT: [employer] [phone] @[address] [place], [place] [address] ° ° °

## 2019-02-08 NOTE — Progress Notes (Signed)
   PRENATAL VISIT NOTE  Subjective:  Nichole Bowers is a 28 y.o. G2P1001 at [redacted]w[redacted]d being seen today for ongoing prenatal care.  She is currently monitored for the following issues for this low-risk pregnancy and has Marijuana use; Ventral hernia without obstruction or gangrene; Supervision of high risk pregnancy, antepartum; History of cesarean section; Sickle cell trait ([place]); Obesity in pregnancy; Chlamydia infection affecting pregnancy; Trichomonas vaginalis (TV) infection; Sickle cell trait in mother affecting pregnancy ([place]); Pyelectasis of fetus on prenatal ultrasound; and Marginal insertion of umbilical cord affecting management of mother on their problem list.  Patient reports no complaints.  Contractions: Irritability. Vag. Bleeding: None.  Movement: Present. Denies leaking of fluid.   The following portions of the patient's history were reviewed and updated as appropriate: allergies, current medications, past family history, past medical history, past social history, past surgical history and problem list.   Objective:   Vitals:   02/08/19 1023  BP: 122/72  Pulse: 85  Weight: 236 lb (107 kg)    Fetal Status: Fetal Heart Rate (bpm): 146 Fundal Height: 21 cm Movement: Present     General:  Alert, oriented and cooperative. Patient is in no acute distress.  Skin: Skin is warm and dry. No rash noted.   Cardiovascular: Normal heart rate noted  Respiratory: Normal respiratory effort, no problems with respiration noted  Abdomen: Soft, gravid, appropriate for gestational age.  Pain/Pressure: Present     Pelvic: Cervical exam deferred        Extremities: Normal range of motion.  Edema: Trace  Mental Status: Normal mood and affect. Normal behavior. Normal judgment and thought content.   Assessment and Plan:   1. Chlamydia infection affecting pregnancy in first trimester  - TOC done today  - Cervicovaginal ancillary only( [place])  2. Trichomonas vaginalis (TV) infection  -  Cervicovaginal ancillary only( [place])  3. Ventral hernia without obstruction or gangrene  Easily reducible today with discomfort, Dr. Ilda Basset in today to exam hernia.  She is still a patient of [facility] and plans to call them today.   4. Obesity in pregnancy   -BASA- continue.    Preterm labor symptoms and general obstetric precautions including but not limited to vaginal bleeding, contractions, leaking of fluid and fetal movement were reviewed in detail with the patient. Please refer to After Visit Summary for other counseling recommendations.   No follow-ups on file.  Future Appointments  Date Time Provider [place]  03/08/2019 10:15 AM Rasch, Artist Pais, NP WOC-WOCA WOC  04/10/2019  9:50 AM WH-MFC NURSE [place] MFC-US  04/10/2019 10:00 AM WH-MFC [place] 3 WH-MFCUS MFC-US    Noni Saupe, NP

## 2019-02-09 ENCOUNTER — Encounter (HOSPITAL_COMMUNITY): Payer: Self-pay

## 2019-02-09 LAB — CERVICOVAGINAL ANCILLARY ONLY
Bacterial vaginitis: NEGATIVE
CHLAMYDIA, DNA PROBE: NEGATIVE
Candida vaginitis: POSITIVE — AB
Neisseria Gonorrhea: NEGATIVE
Trichomonas: NEGATIVE

## 2019-02-12 ENCOUNTER — Telehealth: Payer: Self-pay | Admitting: Obstetrics and Gynecology

## 2019-02-12 ENCOUNTER — Other Ambulatory Visit: Payer: Self-pay | Admitting: Obstetrics and Gynecology

## 2019-02-12 MED ORDER — TERCONAZOLE 0.4 % VA CREA: 1.0000 | TOPICAL_CREAM | Freq: Every day | VAGINAL | 0 refills | Status: DC

## 2019-02-12 NOTE — Telephone Encounter (Signed)
+   yeast on wet prep Rx: Terazol sent to the pharmacy.  All questions asked  Rasch, Artist Pais, NP 02/12/2019 2:13 PM

## 2019-02-13 ENCOUNTER — Encounter: Payer: Self-pay | Admitting: *Deleted

## 2019-02-16 ENCOUNTER — Encounter (HOSPITAL_COMMUNITY): Payer: Self-pay

## 2019-02-16 ENCOUNTER — Other Ambulatory Visit: Payer: Self-pay

## 2019-02-16 ENCOUNTER — Inpatient Hospital Stay (HOSPITAL_COMMUNITY)
Admission: AD | Admit: 2019-02-16 | Discharge: 2019-02-16 | Disposition: A | Discharge status: Home / Self Care | Payer: Medicaid Other | Attending: Obstetrics & Gynecology | Admitting: Obstetrics & Gynecology

## 2019-02-16 ENCOUNTER — Telehealth: Payer: Self-pay | Admitting: *Deleted

## 2019-02-16 DIAGNOSIS — Z79899 Other long term (current) drug therapy: Secondary | ICD-10-CM | POA: Insufficient documentation

## 2019-02-16 DIAGNOSIS — O212 Late vomiting of pregnancy: Secondary | ICD-10-CM | POA: Insufficient documentation

## 2019-02-16 DIAGNOSIS — Z3A21 21 weeks gestation of pregnancy: Secondary | ICD-10-CM | POA: Insufficient documentation

## 2019-02-16 DIAGNOSIS — I4891 Unspecified atrial fibrillation: Secondary | ICD-10-CM | POA: Diagnosis not present

## 2019-02-16 DIAGNOSIS — K439 Ventral hernia without obstruction or gangrene: Secondary | ICD-10-CM | POA: Diagnosis not present

## 2019-02-16 DIAGNOSIS — O26892 Other specified pregnancy related conditions, second trimester: Secondary | ICD-10-CM | POA: Diagnosis not present

## 2019-02-16 DIAGNOSIS — O99412 Diseases of the circulatory system complicating pregnancy, second trimester: Secondary | ICD-10-CM | POA: Insufficient documentation

## 2019-02-16 DIAGNOSIS — Z87891 Personal history of nicotine dependence: Secondary | ICD-10-CM | POA: Insufficient documentation

## 2019-02-16 DIAGNOSIS — O9962 Diseases of the digestive system complicating childbirth: Secondary | ICD-10-CM | POA: Diagnosis not present

## 2019-02-16 DIAGNOSIS — R109 Unspecified abdominal pain: Secondary | ICD-10-CM | POA: Diagnosis not present

## 2019-02-16 DIAGNOSIS — D573 Sickle-cell trait: Secondary | ICD-10-CM | POA: Diagnosis not present

## 2019-02-16 NOTE — MAU Note (Signed)
Has a hernia, (above and to rt of umbilicus) is causing her pain.  Has been throwing up some with it.

## 2019-02-16 NOTE — MAU Provider Note (Signed)
History     CSN: [id number]  Arrival date and time: 02/16/19 1640   First Provider Initiated Contact with Patient 02/16/19 1725      Chief Complaint  Patient presents with  . Abdominal Pain   G2P1001 @21 .3 wks presenting with abdominal pain from hernia. Reports worsening pain this am. States hernia is getting larger. Rates pain 7/10. Has not taken anything for it. Had emesis x3 today. No diarrhea. Tolerating po now. She spoke to someone at [facility] 2 days ago and was told they couldn't do surgery during pregnancy.    OB History    Gravida  2   Para  1   Term  1   Preterm  0   AB  0   Living  1     SAB  0   TAB  0   Ectopic  0   Multiple  0   Live Births  1           Past Medical History:  Diagnosis Date  . Atrial fibrillation ([place])   . Chlamydia 2019  . Cocaine use    Positive drug screen on 11/03/15  . Marijuana use    Positive drug screen on 11/03/15  . Sickle cell trait ([place])   . Urinary tract infection   . Ventral hernia without obstruction or gangrene     Past Surgical History:  Procedure Laterality Date  . CESAREAN SECTION N/A 06/25/2016   Procedure: CESAREAN SECTION;  Surgeon: Osborne Oman, MD;  Location: [place];  Service: Obstetrics;  Laterality: N/A;  . HERNIA REPAIR  2017  . WISDOM TOOTH EXTRACTION      Family History  Problem Relation Age of Onset  . Healthy Mother   . Healthy Father   . Asthma Sister   . Diabetes Maternal Grandfather   . Diabetes Paternal Grandfather     Social History   Tobacco Use  . Smoking status: Former Smoker    Types: Cigarettes    Last attempt to quit: 10/29/2018    Years since quitting: 0.3  . Smokeless tobacco: Never Used  Substance Use Topics  . Alcohol use: Not Currently  . Drug use: Yes    Types: Cocaine, Marijuana    Comment: marijuana 1/30 ;cocaine use- has been years    Allergies:  Allergies  Allergen Reactions  . Phenergan [Promethazine] Nausea And  Vomiting    Medications Prior to Admission  Medication Sig Dispense Refill Last Dose  . aspirin EC 81 MG tablet Take 1 tablet (81 mg total) by mouth daily. (Patient not taking: Reported on 01/30/2019) 90 tablet 1 Not Taking  . cyclobenzaprine (FLEXERIL) 5 MG tablet Take 1 tablet (5 mg total) by mouth 2 (two) times daily as needed for muscle spasms. (Patient not taking: Reported on 01/25/2019) 30 tablet 0 Not Taking  . Elastic Bandages & Supports (COMFORT FIT MATERNITY SUPP LG) MISC 1 each by Does not apply route daily as needed. 1 each 0   . metoCLOPramide (REGLAN) 10 MG tablet Take 1 tablet (10 mg total) by mouth every 8 (eight) hours as needed (headache or nausea). 30 tablet 0 Taking  . metroNIDAZOLE (FLAGYL) 500 MG tablet Take 1 tablet (500 mg total) by mouth 2 (two) times daily. (Patient not taking: Reported on 01/30/2019) 14 tablet 0 Not Taking  . Prenatal Multivit-Min-Fe-FA (PRENATAL VITAMINS) 0.8 MG tablet Take 1 tablet by mouth daily. 30 tablet 12 Taking  . terconazole (TERAZOL 7) 0.4 % vaginal  cream Place 1 applicator vaginally at bedtime. 45 g 0     Review of Systems  Constitutional: Negative.   Gastrointestinal: Positive for abdominal pain and vomiting. Negative for diarrhea.   Physical Exam   Blood pressure 127/74, pulse 94, temperature 98.8 F (37.1 C), temperature source Oral, resp. rate 18, weight 108.2 kg, last menstrual period 09/19/2018, SpO2 100 %.  Physical Exam  Constitutional: She is oriented to person, place, and time. She appears well-developed and well-nourished. No distress.  HENT:  Head: Normocephalic and atraumatic.  Neck: Normal range of motion.  Cardiovascular: Normal rate.  Respiratory: Effort normal. No respiratory distress.  GI: Soft. She exhibits no distension and no mass. There is abdominal tenderness. There is no rebound and no guarding. No hernia.    Wide diastasis present superior to umbilicus  Musculoskeletal: Normal range of motion.  Neurological:  She is alert and oriented to person, place, and time.  Skin: Skin is warm and dry.  Psychiatric: She has a normal mood and affect.  FHT 158  MAU Course  Procedures  MDM Diastasis present, no hernia at time of exam. No evidence of incarceration or strangulation today. Will order abdominal binder for support. Stable for discharge home.  Assessment and Plan   1. [redacted] weeks gestation of pregnancy   2. Ventral hernia without obstruction or gangrene    Discharge home Follow up at [facility] as scheduled Return precautions  Allergies as of 02/16/2019      Reactions   Phenergan [promethazine] Nausea And Vomiting      Medication List    TAKE these medications   aspirin EC 81 MG tablet Take 1 tablet (81 mg total) by mouth daily.   Comfort Fit Maternity Supp Lg Misc 1 each by Does not apply route daily as needed.   cyclobenzaprine 5 MG tablet Commonly known as:  FLEXERIL Take 1 tablet (5 mg total) by mouth 2 (two) times daily as needed for muscle spasms.   metoCLOPramide 10 MG tablet Commonly known as:  REGLAN Take 1 tablet (10 mg total) by mouth every 8 (eight) hours as needed (headache or nausea).   metroNIDAZOLE 500 MG tablet Commonly known as:  FLAGYL Take 1 tablet (500 mg total) by mouth 2 (two) times daily.   Prenatal Vitamins 0.8 MG tablet Take 1 tablet by mouth daily.   terconazole 0.4 % vaginal cream Commonly known as:  TERAZOL 7 Place 1 applicator vaginally at bedtime.      Julianne Handler, CNM 02/16/2019, 5:37 PM

## 2019-02-16 NOTE — Discharge Instructions (Signed)
Ventral Hernia    A ventral hernia is a bulge of tissue from inside the abdomen that pushes through a weak area of the muscles that form the front wall of the abdomen. The tissues inside the abdomen are inside a sac (peritoneum). These tissues include the small intestine, large intestine, and the fatty tissue that covers the intestines (omentum). Sometimes, the bulge that forms a hernia contains intestines. Other hernias contain only fat. Ventral hernias do not go away without surgical treatment.  There are several types of ventral hernias. You may have:   A hernia at an incision site from previous abdominal surgery (incisional hernia).   A hernia just above the belly button (epigastric hernia), or at the belly button (umbilical hernia). These types of hernias can develop from heavy lifting or straining.   A hernia that comes and goes (reducible hernia). It may be visible only when you lift or strain. This type of hernia can be pushed back into the abdomen (reduced).   A hernia that traps abdominal tissue inside the hernia (incarcerated hernia). This type of hernia does not reduce.   A hernia that cuts off blood flow to the tissues inside the hernia (strangulated hernia). The tissues can start to die if this happens. This is a very painful bulge that cannot be reduced. A strangulated hernia is a medical emergency.  What are the causes?  This condition is caused by abdominal tissue putting pressure on an area of weakness in the abdominal muscles.  What increases the risk?  The following factors may make you more likely to develop this condition:   Being female.   Being 60 or older.   Being overweight or obese.   Having had previous abdominal surgery, especially if there was an infection after surgery.   Having had an injury to the abdominal wall.   Having had several pregnancies.   Having a buildup of fluid inside the abdomen (ascites).  What are the signs or symptoms?  The only symptom of a ventral hernia  may be a painless bulge in the abdomen. A reducible hernia may be visible only when you strain, cough, or lift. Other symptoms may include:   Dull pain.   A feeling of pressure.  Signs and symptoms of a strangulated hernia may include:   Increasing pain.   Nausea and vomiting.   Pain when pressing on the hernia.   The skin over the hernia turning red or purple.   Constipation.   Blood in the stool (feces).  How is this diagnosed?  This condition may be diagnosed based on:   Your symptoms.   Your medical history.   A physical exam. You may be asked to cough or strain while standing. These actions increase the pressure inside your abdomen and force the hernia through the opening in your muscles. Your health care provider may try to reduce the hernia by pressing on it.   Imaging studies, such as an ultrasound or CT scan.  How is this treated?  This condition is treated with surgery. If you have a strangulated hernia, surgery is done as soon as possible. If your hernia is small and not incarcerated, you may be asked to lose some weight before surgery.  Follow these instructions at home:   Follow instructions from your health care provider about eating or drinking restrictions.   If you are overweight, your health care provider may recommend that you increase your activity level and eat a healthier diet.     Do not lift anything that is heavier than 10 lb (4.5 kg).   Return to your normal activities as told by your health care provider. Ask your health care provider what activities are safe for you. You may need to avoid activities that increase pressure on your hernia.   Take over-the-counter and prescription medicines only as told by your health care provider.   Keep all follow-up visits as told by your health care provider. This is important.  Contact a health care provider if:   Your hernia gets larger.   Your hernia becomes painful.  Get help right away if:   Your hernia becomes increasingly  painful.   You have pain along with any of the following:  ? Changes in skin color in the area of the hernia.  ? Nausea.  ? Vomiting.  ? Fever.  Summary   A ventral hernia is a bulge of tissue from inside the abdomen that pushes through a weak area of the muscles that form the front wall of the abdomen.   This condition is treated with surgery, which may be urgent depending on your hernia.   Do not lift anything that is heavier than 10 lb (4.5 kg), and follow activity instructions from your health care provider.  This information is not intended to replace advice given to you by your health care provider. Make sure you discuss any questions you have with your health care provider.  Document Released: 11/01/2012 Document Revised: 12/28/2017 Document Reviewed: 06/06/2017  Elsevier Interactive Patient Education  2019 Elsevier Inc.

## 2019-02-16 NOTE — Telephone Encounter (Signed)
Trissa called and left a voice message this am that she would like [facility] Rasch to call her and that it is kind of urgent.   I called Aalijah and she reports that she called the Company re: the support belt and no answer yet- we discussed they may be closed due to corona virus and keep trying or go to store any buy maternity belt out of pocket if any stores are open .  She also reports that she called [facility] and they informed her they will not do surgery until after she delivers. She also reports that her hernia has gotten bigger and is more painful - up to 9.5 and she couldn't sleep for hurting last night. I discussed with Dr. Kennon Rounds and asked Murry if she is vomiting-she says she is vomiting some of the time. I instructed her to go to MAU for evaluation for possible bowel strangulation. She voices understanding.

## 2019-02-20 ENCOUNTER — Encounter: Payer: Self-pay | Admitting: *Deleted

## 2019-03-08 ENCOUNTER — Encounter: Payer: Self-pay | Admitting: Obstetrics and Gynecology

## 2019-03-29 ENCOUNTER — Other Ambulatory Visit: Payer: Medicaid Other

## 2019-03-29 ENCOUNTER — Other Ambulatory Visit: Payer: Self-pay

## 2019-03-29 ENCOUNTER — Ambulatory Visit (INDEPENDENT_AMBULATORY_CARE_PROVIDER_SITE_OTHER): Payer: Medicaid Other | Admitting: Obstetrics & Gynecology

## 2019-03-29 ENCOUNTER — Encounter: Payer: Self-pay | Admitting: Obstetrics & Gynecology

## 2019-03-29 VITALS — BP 119/65 | HR 97 | Wt 242.9 lb

## 2019-03-29 DIAGNOSIS — O98812 Other maternal infectious and parasitic diseases complicating pregnancy, second trimester: Secondary | ICD-10-CM | POA: Diagnosis not present

## 2019-03-29 DIAGNOSIS — O0992 Supervision of high risk pregnancy, unspecified, second trimester: Secondary | ICD-10-CM

## 2019-03-29 DIAGNOSIS — Z23 Encounter for immunization: Secondary | ICD-10-CM

## 2019-03-29 DIAGNOSIS — A749 Chlamydial infection, unspecified: Secondary | ICD-10-CM

## 2019-03-29 DIAGNOSIS — O0990 Supervision of high risk pregnancy, unspecified, unspecified trimester: Secondary | ICD-10-CM

## 2019-03-29 DIAGNOSIS — O98811 Other maternal infectious and parasitic diseases complicating pregnancy, first trimester: Secondary | ICD-10-CM

## 2019-03-29 NOTE — Progress Notes (Signed)
Pt signed up for [employer]. Pt able to sign up, download, and log in to the app.  Pt shown how to record a blood pressure and a weight.  Instructed pt to check her weight and BP weekly.  Pt advised that her cuff would be delivered to her house from [employer] and if she did not receive that cuff by the end of 1 week to call our office.  Pt verbalized and demonstrated understanding.

## 2019-03-29 NOTE — Progress Notes (Signed)
   PRENATAL VISIT NOTE  Subjective:  Nichole Bowers is a 28 y.o. G2P1001 at [redacted]w[redacted]d being seen today for ongoing prenatal care.  She is currently monitored for the following issues for this high-risk pregnancy and has Marijuana use; Ventral hernia without obstruction or gangrene; Supervision of high risk pregnancy, antepartum; History of cesarean section; Sickle cell trait ([place]); Obesity in pregnancy; Chlamydia infection affecting pregnancy; Trichomonas vaginalis (TV) infection; Sickle cell trait in mother affecting pregnancy ([place]); Pyelectasis of fetus on prenatal ultrasound; and Marginal insertion of umbilical cord affecting management of mother on their problem list.  Patient reports no complaints.  Contractions: Irritability. Vag. Bleeding: None.  Movement: Present. Denies leaking of fluid.   The following portions of the patient's history were reviewed and updated as appropriate: allergies, current medications, past family history, past medical history, past social history, past surgical history and problem list.   Objective:   Vitals:   03/29/19 0837  BP: 119/65  Pulse: 97  Weight: 242 lb 14.4 oz (110.2 kg)    Fetal Status:     Movement: Present     General:  Alert, oriented and cooperative. Patient is in no acute distress.  Skin: Skin is warm and dry. No rash noted.   Cardiovascular: Normal heart rate noted  Respiratory: Normal respiratory effort, no problems with respiration noted  Abdomen: Soft, gravid, appropriate for gestational age.  Pain/Pressure: Present     Pelvic: Cervical exam deferred        Extremities: Normal range of motion.  Edema: Trace  Mental Status: Normal mood and affect. Normal behavior. Normal judgment and thought content.   Assessment and Plan:  Pregnancy: G2P1001 at [redacted]w[redacted]d 1. Supervision of high risk pregnancy, antepartum Routine test - Tdap vaccine greater than or equal to 7yo IM - CHL AMB BABYSCRIPTS SCHEDULE OPTIMIZATION  2. Chlamydia infection  affecting pregnancy in first trimester S/p TOC  Preterm labor symptoms and general obstetric precautions including but not limited to vaginal bleeding, contractions, leaking of fluid and fetal movement were reviewed in detail with the patient. Please refer to After Visit Summary for other counseling recommendations.   Return in about 4 weeks (around 04/26/2019) for virtual.  Future Appointments  Date Time Provider [place]  03/29/2019  8:55 AM Woodroe Mode, MD [facility] [place]  04/10/2019  9:50 AM [facility] MFC-US  04/10/2019 10:00 AM WH-MFC [place] 3 WH-MFCUS MFC-US    Emeterio Reeve, MD

## 2019-03-29 NOTE — Patient Instructions (Signed)

## 2019-03-30 LAB — CBC
Hematocrit: 32.7 % — ABNORMAL LOW (ref 34.0–46.6)
Hemoglobin: 11.1 g/dL (ref 11.1–15.9)
MCH: 28.8 pg (ref 26.6–33.0)
MCHC: 33.9 g/dL (ref 31.5–35.7)
MCV: 85 fL (ref 79–97)
Platelets: 232 10*3/uL (ref 150–450)
RBC: 3.85 x10E6/uL (ref 3.77–5.28)
RDW: 12.9 % (ref 11.7–15.4)
WBC: 10.1 10*3/uL (ref 3.4–10.8)

## 2019-03-30 LAB — HIV ANTIBODY (ROUTINE TESTING W REFLEX): HIV Screen 4th Generation wRfx: NONREACTIVE

## 2019-03-30 LAB — GLUCOSE TOLERANCE, 2 HOURS W/ 1HR
Glucose, 1 hour: 157 mg/dL (ref 65–179)
Glucose, 2 hour: 136 mg/dL (ref 65–152)
Glucose, Fasting: 97 mg/dL — ABNORMAL HIGH (ref 65–91)

## 2019-03-30 LAB — RPR: RPR Ser Ql: NONREACTIVE

## 2019-04-02 ENCOUNTER — Telehealth (INDEPENDENT_AMBULATORY_CARE_PROVIDER_SITE_OTHER): Payer: Medicaid Other | Admitting: *Deleted

## 2019-04-02 DIAGNOSIS — Z3400 Encounter for supervision of normal first pregnancy, unspecified trimester: Secondary | ICD-10-CM

## 2019-04-02 NOTE — Telephone Encounter (Signed)
-----   Message from Woodroe Mode, MD sent at 03/30/2019 12:16 PM EDT ----- Needs diabetes education

## 2019-04-02 NOTE — Telephone Encounter (Signed)
Called pt to inform her to 2 hr gtt results and the need for pt to have an appointment for diabetic education.  Advised pt that the front office would be contacting her to schedule that appointment.  Pt verbalized understanding.

## 2019-04-10 ENCOUNTER — Other Ambulatory Visit (HOSPITAL_COMMUNITY): Payer: Self-pay | Admitting: *Deleted

## 2019-04-10 ENCOUNTER — Ambulatory Visit: Payer: Medicaid Other | Admitting: *Deleted

## 2019-04-10 ENCOUNTER — Encounter: Payer: Medicaid Other | Attending: Obstetrics & Gynecology | Admitting: *Deleted

## 2019-04-10 ENCOUNTER — Ambulatory Visit (HOSPITAL_COMMUNITY)
Admission: RE | Admit: 2019-04-10 | Discharge: 2019-04-10 | Disposition: A | Discharge status: Home / Self Care | Payer: Medicaid Other | Source: Ambulatory Visit | Attending: Obstetrics and Gynecology | Admitting: Obstetrics and Gynecology

## 2019-04-10 ENCOUNTER — Other Ambulatory Visit: Payer: Self-pay

## 2019-04-10 ENCOUNTER — Encounter (HOSPITAL_COMMUNITY): Payer: Self-pay

## 2019-04-10 ENCOUNTER — Ambulatory Visit (HOSPITAL_COMMUNITY): Payer: Medicaid Other | Admitting: *Deleted

## 2019-04-10 VITALS — Temp 98.6°F

## 2019-04-10 DIAGNOSIS — R7302 Impaired glucose tolerance (oral): Secondary | ICD-10-CM

## 2019-04-10 DIAGNOSIS — O43199 Other malformation of placenta, unspecified trimester: Secondary | ICD-10-CM

## 2019-04-10 DIAGNOSIS — O0990 Supervision of high risk pregnancy, unspecified, unspecified trimester: Secondary | ICD-10-CM | POA: Diagnosis present

## 2019-04-10 DIAGNOSIS — O24419 Gestational diabetes mellitus in pregnancy, unspecified control: Secondary | ICD-10-CM | POA: Diagnosis not present

## 2019-04-10 DIAGNOSIS — O99213 Obesity complicating pregnancy, third trimester: Secondary | ICD-10-CM

## 2019-04-10 DIAGNOSIS — Z362 Encounter for other antenatal screening follow-up: Secondary | ICD-10-CM | POA: Diagnosis not present

## 2019-04-10 DIAGNOSIS — O34219 Maternal care for unspecified type scar from previous cesarean delivery: Secondary | ICD-10-CM | POA: Diagnosis not present

## 2019-04-10 DIAGNOSIS — Z3A00 Weeks of gestation of pregnancy not specified: Secondary | ICD-10-CM | POA: Insufficient documentation

## 2019-04-10 DIAGNOSIS — Z3A29 29 weeks gestation of pregnancy: Secondary | ICD-10-CM

## 2019-04-10 DIAGNOSIS — O24410 Gestational diabetes mellitus in pregnancy, diet controlled: Secondary | ICD-10-CM

## 2019-04-10 DIAGNOSIS — Z862 Personal history of diseases of the blood and blood-forming organs and certain disorders involving the immune mechanism: Secondary | ICD-10-CM

## 2019-04-10 MED ORDER — ACCU-CHEK GUIDE W/DEVICE KIT: 1.0000 | PACK | Freq: Four times a day (QID) | 0 refills | Status: DC

## 2019-04-10 MED ORDER — ACCU-CHEK FASTCLIX LANCETS MISC: 1.0000 | Freq: Four times a day (QID) | 12 refills | Status: DC

## 2019-04-10 MED ORDER — GLUCOSE BLOOD VI STRP: ORAL_STRIP | 12 refills | Status: DC

## 2019-04-10 NOTE — Progress Notes (Signed)
  Patient was seen on 04/10/2019 for Gestational Diabetes self-management. EDD 06/26/2019. Patient states no history of GDM. She states her sister had GDM and she assisted her with using the meter then. Diet history obtained. Patient eats good variety of all food groups in two meals, she has been nauseated and has not been able to eat early in the AM. Beverages include water and occasionally fruit juice.  The following learning objectives were met by the patient :   States the definition of Gestational Diabetes  States why dietary management is important in controlling blood glucose  Describes the effects of carbohydrates on blood glucose levels  Demonstrates ability to create a balanced meal plan  Demonstrates carbohydrate counting   States when to check blood glucose levels  Demonstrates proper blood glucose monitoring techniques  States the effect of stress and exercise on blood glucose levels  States the importance of limiting caffeine and abstaining from alcohol and smoking  Plan:  Aim for 3 Carb Choices per meal (45 grams) +/- 1 either way  Aim for 1-2 Carbs per snack Begin reading food labels for Total Carbohydrate of foods If OK with your MD, consider  increasing your activity level by walking, Arm Chair Exercises or other activity daily as tolerated Begin checking BG before breakfast and 2 hours after first bite of breakfast, lunch and dinner as directed by MD  Bring Log Book/Sheet and meter to every medical appointment OR use Baby Scripts (see below) Baby Scripts:  Patient was introduced to [employer] and plans to use as record of BG electronically  Take medication if directed by MD  Blood glucose monitor Rx called into pharmacy: Accu Check Guide with Fast Clix drums Patient instructed to test pre breakfast and 2 hours each meal as directed by MD  Patient instructed to monitor glucose levels: FBS: 60 - 95 mg/dl 2 hour: <120 mg/dl  Patient received the following  handouts:  Nutrition Diabetes and Pregnancy  Carbohydrate Counting List  BG Log Sheet  Patient will be seen for follow-up as needed.

## 2019-04-10 NOTE — Addendum Note (Signed)
Addended by: Michel Harrow on: 04/10/2019 04:47 PM   Modules accepted: Orders

## 2019-04-10 NOTE — Addendum Note (Signed)
Addended by: Anne Shutter on: 04/10/2019 12:59 PM   Modules accepted: Orders

## 2019-04-26 ENCOUNTER — Other Ambulatory Visit: Payer: Self-pay

## 2019-04-26 ENCOUNTER — Encounter: Payer: Self-pay | Admitting: Obstetrics and Gynecology

## 2019-04-26 ENCOUNTER — Telehealth (INDEPENDENT_AMBULATORY_CARE_PROVIDER_SITE_OTHER): Payer: Medicaid Other | Admitting: Obstetrics and Gynecology

## 2019-04-26 VITALS — BP 130/93 | HR 95

## 2019-04-26 DIAGNOSIS — O24419 Gestational diabetes mellitus in pregnancy, unspecified control: Secondary | ICD-10-CM

## 2019-04-26 DIAGNOSIS — O43199 Other malformation of placenta, unspecified trimester: Secondary | ICD-10-CM

## 2019-04-26 DIAGNOSIS — O98813 Other maternal infectious and parasitic diseases complicating pregnancy, third trimester: Secondary | ICD-10-CM

## 2019-04-26 DIAGNOSIS — Z98891 History of uterine scar from previous surgery: Secondary | ICD-10-CM | POA: Diagnosis not present

## 2019-04-26 DIAGNOSIS — O43193 Other malformation of placenta, third trimester: Secondary | ICD-10-CM

## 2019-04-26 DIAGNOSIS — Z3A31 31 weeks gestation of pregnancy: Secondary | ICD-10-CM

## 2019-04-26 DIAGNOSIS — O0990 Supervision of high risk pregnancy, unspecified, unspecified trimester: Secondary | ICD-10-CM

## 2019-04-26 DIAGNOSIS — D573 Sickle-cell trait: Secondary | ICD-10-CM | POA: Diagnosis not present

## 2019-04-26 DIAGNOSIS — A749 Chlamydial infection, unspecified: Secondary | ICD-10-CM

## 2019-04-26 NOTE — Progress Notes (Signed)
I connected with  Angell Pincock on 04/26/19 at  1:15 PM EDT by telephone and verified that I am speaking with the correct person using two identifiers.   I discussed the limitations, risks, security and privacy concerns of performing an evaluation and management service by telephone and the availability of in person appointments. I also discussed with the patient that there may be a patient responsible charge related to this service. The patient expressed understanding and agreed to proceed.  [place], CMA 04/26/2019  1:16 PM

## 2019-04-26 NOTE — Progress Notes (Signed)
TELEHEALTH VIRTUAL OBSTETRICS VISIT ENCOUNTER NOTE  I connected with Nichole Bowers on 04/26/19 at  1:15 PM EDT by t at home and verified that I am speaking with the correct person using two identifiers.   I discussed the limitations, risks, security and privacy concerns of performing an evaluation and management service by telephone and the availability of in person appointments. I also discussed with the patient that there may be a patient responsible charge related to this service. The patient expressed understanding and agreed to proceed.  Subjective:  Nichole Bowers is a 28 y.o. G2P1001 at [redacted]w[redacted]d being followed for ongoing prenatal care.  She is currently monitored for the following issues for this high-risk pregnancy and has Marijuana use; Ventral hernia without obstruction or gangrene; Supervision of high risk pregnancy, antepartum; History of cesarean section; Sickle cell trait ([place]); Obesity in pregnancy; Chlamydia infection affecting pregnancy; Trichomonas vaginalis (TV) infection; Sickle cell trait in mother affecting pregnancy ([place]); Pyelectasis of fetus on prenatal ultrasound; Marginal insertion of umbilical cord affecting management of mother; and GDM (gestational diabetes mellitus) on their problem list.  Patient reports nausea. Reports fetal movement. Denies any contractions, bleeding or leaking of fluid.   The following portions of the patient's history were reviewed and updated as appropriate: allergies, current medications, past family history, past medical history, past social history, past surgical history and problem list.   Objective:   General:  Alert, oriented and cooperative.   Mental Status: Normal mood and affect perceived. Normal judgment and thought content.  Rest of physical exam deferred due to type of encounter  Assessment and Plan:  Pregnancy: G2P1001 at [redacted]w[redacted]d  1. Gestational diabetes mellitus (GDM), antepartum, gestational diabetes method of control  unspecified  Not checking BS regularly at home. She has supplies She has checked 2 fasting BS's which were <100 Fasting BS this morning was 97 Continue baby ASA   Lengthy discussion about importance of testing blood sugars, bringing log book and keeping appointments. Discussed risks of uncontrolled DM in pregnancy including delayed fetal lung maturity, IUFD and shoulder dystocia possibly resulting brachial plexus palsy, brain damage, intrapartum death and extensive obstetric lacerations. Patient verbalizes understanding.   2. Supervision of high risk pregnancy, antepartum  BP today 116/78 Needs weekly BPP's starting at 32 weeks  Needs to start antenatal testing [place] for Growth   3. History of cesarean section  Plans TOLAC will need to sign consent   4. Chlamydia infection affecting pregnancy in first trimester  TOC negative   5. Sickle cell trait ([place])  Needs urine culture at next visit.   6. Marginal insertion of umbilical cord affecting management of mother  [place] scheduled for June 10    There are no diagnoses linked to this encounter. Preterm labor symptoms and general obstetric precautions including but not limited to vaginal bleeding, contractions, leaking of fluid and fetal movement were reviewed in detail with the patient.  I discussed the assessment and treatment plan with the patient. The patient was provided an opportunity to ask questions and all were answered. The patient agreed with the plan and demonstrated an understanding of the instructions. The patient was advised to call back or seek an in-person office evaluation/go to [facility] at [facility] for any urgent or concerning symptoms. Please refer to After Visit Summary for other counseling recommendations.   I provided 15 minutes of non-face-to-face time during this encounter.  Return in about 2 weeks (around 05/10/2019) for High risk, MD only- needs to start  antenatal testing next week. .  Future  Appointments  Date Time Provider [place]  05/09/2019 10:00 AM [place] [facility]  05/09/2019 10:00 AM [facility] [place] 3 [facility]    Jennifer Rasch, NP [facility] [employer], [place]

## 2019-05-03 ENCOUNTER — Other Ambulatory Visit: Payer: Self-pay

## 2019-05-03 ENCOUNTER — Ambulatory Visit (INDEPENDENT_AMBULATORY_CARE_PROVIDER_SITE_OTHER): Payer: Medicaid Other | Admitting: *Deleted

## 2019-05-03 ENCOUNTER — Ambulatory Visit: Payer: Self-pay

## 2019-05-03 VITALS — BP 118/64 | HR 123 | Wt 243.6 lb

## 2019-05-03 DIAGNOSIS — O0990 Supervision of high risk pregnancy, unspecified, unspecified trimester: Secondary | ICD-10-CM

## 2019-05-03 DIAGNOSIS — O24419 Gestational diabetes mellitus in pregnancy, unspecified control: Secondary | ICD-10-CM | POA: Diagnosis not present

## 2019-05-03 NOTE — Progress Notes (Signed)
Pt states she does not check her blood sugar often and does not enter values to BabyRx. "I have a lot going on and I sleep a lot because I'm tired". Pt advised of the extreme importance of checking BS 4x daily as instructed and following diabetic diet. I offered paper log in order to document the CBG values and she accepted. We discussed ways that she could be more successful with regimen of checking blood sugar. Pt seemed agreeable to try recommendations made.

## 2019-05-09 ENCOUNTER — Other Ambulatory Visit: Payer: Self-pay

## 2019-05-09 ENCOUNTER — Ambulatory Visit (HOSPITAL_COMMUNITY): Payer: Medicaid Other | Admitting: *Deleted

## 2019-05-09 ENCOUNTER — Encounter (HOSPITAL_COMMUNITY): Payer: Self-pay

## 2019-05-09 ENCOUNTER — Ambulatory Visit (HOSPITAL_COMMUNITY)
Admission: RE | Admit: 2019-05-09 | Discharge: 2019-05-09 | Disposition: A | Discharge status: Home / Self Care | Payer: Medicaid Other | Source: Ambulatory Visit | Attending: Obstetrics and Gynecology | Admitting: Obstetrics and Gynecology

## 2019-05-09 ENCOUNTER — Telehealth: Payer: Self-pay | Admitting: Family Medicine

## 2019-05-09 ENCOUNTER — Other Ambulatory Visit (HOSPITAL_COMMUNITY): Payer: Self-pay | Admitting: *Deleted

## 2019-05-09 VITALS — BP 114/75 | HR 99 | Temp 98.2°F

## 2019-05-09 DIAGNOSIS — Z3A33 33 weeks gestation of pregnancy: Secondary | ICD-10-CM

## 2019-05-09 DIAGNOSIS — O34219 Maternal care for unspecified type scar from previous cesarean delivery: Secondary | ICD-10-CM

## 2019-05-09 DIAGNOSIS — Z362 Encounter for other antenatal screening follow-up: Secondary | ICD-10-CM | POA: Diagnosis not present

## 2019-05-09 DIAGNOSIS — O99213 Obesity complicating pregnancy, third trimester: Secondary | ICD-10-CM | POA: Diagnosis not present

## 2019-05-09 DIAGNOSIS — O24419 Gestational diabetes mellitus in pregnancy, unspecified control: Secondary | ICD-10-CM | POA: Diagnosis not present

## 2019-05-09 DIAGNOSIS — Z862 Personal history of diseases of the blood and blood-forming organs and certain disorders involving the immune mechanism: Secondary | ICD-10-CM

## 2019-05-09 DIAGNOSIS — O24410 Gestational diabetes mellitus in pregnancy, diet controlled: Secondary | ICD-10-CM | POA: Diagnosis not present

## 2019-05-09 NOTE — Telephone Encounter (Signed)
Called patient she was instructed to wear a face mask, and she is aware no visitors are not allowed at this time [place] to [place],  Patient do not have any symptoms

## 2019-05-10 ENCOUNTER — Ambulatory Visit (INDEPENDENT_AMBULATORY_CARE_PROVIDER_SITE_OTHER): Payer: Medicaid Other | Admitting: Obstetrics & Gynecology

## 2019-05-10 ENCOUNTER — Other Ambulatory Visit: Payer: Medicaid Other

## 2019-05-10 ENCOUNTER — Other Ambulatory Visit: Payer: Self-pay | Admitting: Obstetrics & Gynecology

## 2019-05-10 VITALS — BP 115/62 | HR 92 | Temp 98.6°F | Wt 243.8 lb

## 2019-05-10 DIAGNOSIS — O0993 Supervision of high risk pregnancy, unspecified, third trimester: Secondary | ICD-10-CM

## 2019-05-10 DIAGNOSIS — Z3A33 33 weeks gestation of pregnancy: Secondary | ICD-10-CM

## 2019-05-10 DIAGNOSIS — O0990 Supervision of high risk pregnancy, unspecified, unspecified trimester: Secondary | ICD-10-CM

## 2019-05-10 NOTE — Patient Instructions (Signed)
Gestational Diabetes Mellitus, Diagnosis Gestational diabetes (gestational diabetes mellitus) is a short-term (temporary) form of diabetes that can happen during pregnancy. It goes away after you give birth. It may be caused by one or both of these problems:  Your pancreas does not make enough of a hormone called insulin.  Your body does not respond in a normal way to insulin that it makes. Insulin lets sugars (glucose) go into cells in the body. This gives you energy. If you have diabetes, sugars cannot get into cells. This causes high blood sugar (hyperglycemia). If you get gestational diabetes, you are:  More likely to get it if you get pregnant again.  More likely to develop type 2 diabetes in the future. If gestational diabetes is treated, it may not hurt you or your baby. Your doctor will set treatment goals for you. In general, you should have these blood sugar levels:  After not eating for a long time (fasting): 95 mg/dL (5.3 mmol/L).  After meals (postprandial): ? One hour after a meal: at or below 140 mg/dL (7.8 mmol/L). ? Two hours after a meal: at or below 120 mg/dL (6.7 mmol/L).  A1c (hemoglobin A1c) level: 6-6.5%. Follow these instructions at home: Questions to ask your doctor   You may want to ask these questions: ? Do I need to meet with a diabetes educator? ? What equipment will I need to care for myself at home? ? What medicines do I need? When should I take them? ? How often do I need to check my blood sugar? ? What number can I call if I have questions? ? When is my next doctor's visit? General instructions  Take over-the-counter and prescription medicines only as told by your doctor.  Stay at a healthy weight during pregnancy.  Keep all follow-up visits as told by your doctor. This is important. Contact a doctor if:  Your blood sugar is at or above 240 mg/dL (13.3 mmol/L).  Your blood sugar is at or above 200 mg/dL (11.1 mmol/L) and you have ketones in  your pee (urine).  You have been sick or have had a fever for 2 days or more and you are not getting better.  You have any of these problems for more than 6 hours: ? You cannot eat or drink. ? You feel sick to your stomach (nauseous). ? You throw up (vomit). ? You have watery poop (diarrhea). Get help right away if:  Your blood sugar is lower than 54 mg/dL (3 mmol/L).  You get confused.  You have trouble: ? Thinking clearly. ? Breathing.  Your baby moves less than normal.  You have any of these: ? Moderate or large ketone levels in your pee. ? Blood coming from your vagina. ? Unusual fluid coming from your vagina. ? Early contractions. These may feel like tightness in your belly. Summary  Gestational diabetes is a short-term form of diabetes. It can happen while you are pregnant. It goes away after you give birth.  If gestational diabetes is treated, it may not hurt you or your baby. Your doctor will set treatment goals for you.  Keep all follow-up visits as told by your doctor. This is important. This information is not intended to replace advice given to you by your health care provider. Make sure you discuss any questions you have with your health care provider. Document Released: 03/08/2016 Document Revised: 07/11/2017 Document Reviewed: 12/19/2015 Elsevier Interactive Patient Education  2019 [employer].

## 2019-05-10 NOTE — Progress Notes (Signed)
   PRENATAL VISIT NOTE  Subjective:  Nichole Bowers is a 28 y.o. G2P1001 at [redacted]w[redacted]d being seen today for ongoing prenatal care.  She is currently monitored for the following issues for this high-risk pregnancy and has Marijuana use; Ventral hernia without obstruction or gangrene; Supervision of high risk pregnancy, antepartum; History of cesarean section; Sickle cell trait ([place]); Obesity in pregnancy; Chlamydia infection affecting pregnancy; Trichomonas vaginalis (TV) infection; Sickle cell trait in mother affecting pregnancy ([place]); Pyelectasis of fetus on prenatal ultrasound; Marginal insertion of umbilical cord affecting management of mother; and GDM (gestational diabetes mellitus) on their problem list.  Patient reports no complaints.  Contractions: Irritability. Vag. Bleeding: None.  Movement: Present. Denies leaking of fluid.   The following portions of the patient's history were reviewed and updated as appropriate: allergies, current medications, past family history, past medical history, past social history, past surgical history and problem list.   Objective:   Vitals:   05/10/19 1543  BP: 115/62  Pulse: 92  Temp: 98.6 F (37 C)  Weight: 243 lb 12.8 oz (110.6 kg)    Fetal Status:     Movement: Present     General:  Alert, oriented and cooperative. Patient is in no acute distress.  Skin: Skin is warm and dry. No rash noted.   Cardiovascular: Normal heart rate noted  Respiratory: Normal respiratory effort, no problems with respiration noted  Abdomen: Soft, gravid, appropriate for gestational age.  Pain/Pressure: Present     Pelvic: Cervical exam deferred        Extremities: Normal range of motion.  Edema: Trace  Mental Status: Normal mood and affect. Normal behavior. Normal judgment and thought content.   Assessment and Plan:  Pregnancy: G2P1001 at [redacted]w[redacted]d 1. Supervision of high risk pregnancy, antepartum GDM good control diet only, growth 78%ile yesterday - Culture, OB Urine   Preterm labor symptoms and general obstetric precautions including but not limited to vaginal bleeding, contractions, leaking of fluid and fetal movement were reviewed in detail with the patient. Please refer to After Visit Summary for other counseling recommendations.   Return in about 2 weeks (around 05/24/2019) for virtual.  Future Appointments  Date Time Provider [place]  05/24/2019  4:15 PM Donnamae Jude, MD [place]  06/06/2019 10:00 AM [place] [facility]-US  06/06/2019 10:00 AM [facility] [place] 3 [facility] M[facility]-US    Emeterio Reeve, MD

## 2019-05-10 NOTE — Addendum Note (Signed)
Addended by: Alric Seton on: 05/10/2019 04:49 PM   Modules accepted: Orders

## 2019-05-12 LAB — CULTURE, OB URINE

## 2019-05-12 LAB — URINE CULTURE, OB REFLEX

## 2019-05-16 ENCOUNTER — Telehealth: Payer: Self-pay | Admitting: *Deleted

## 2019-05-16 DIAGNOSIS — O0990 Supervision of high risk pregnancy, unspecified, unspecified trimester: Secondary | ICD-10-CM

## 2019-05-16 NOTE — Telephone Encounter (Signed)
Called pt to inform her that, per review, it was noted that the last blood pressure logged into babyscripts was on 04/24/19.  Requested pt take a BP and log it into the app.  Reminded pt that we request that pt's log their blood pressures weekly.  Pt verbalized understanding, took BP and logged it into the app. Pt states her BP was 118/74.

## 2019-05-21 NOTE — Progress Notes (Signed)
Patient ID: Nichole Bowers, female   DOB: [birth year], 28 y.o.   MRN: [id number] I have reviewed the chart and agree with nursing staff's documentation of this patient's encounter.  Emeterio Reeve, MD 05/21/2019 10:13 PM

## 2019-05-24 ENCOUNTER — Telehealth: Payer: Medicaid Other | Admitting: Family Medicine

## 2019-05-26 ENCOUNTER — Encounter: Payer: Self-pay | Admitting: Family Medicine

## 2019-05-28 ENCOUNTER — Telehealth (INDEPENDENT_AMBULATORY_CARE_PROVIDER_SITE_OTHER): Payer: Medicaid Other | Admitting: Family Medicine

## 2019-05-28 ENCOUNTER — Other Ambulatory Visit: Payer: Self-pay

## 2019-05-28 DIAGNOSIS — Z98891 History of uterine scar from previous surgery: Secondary | ICD-10-CM

## 2019-05-28 DIAGNOSIS — O0990 Supervision of high risk pregnancy, unspecified, unspecified trimester: Secondary | ICD-10-CM

## 2019-05-28 DIAGNOSIS — O43193 Other malformation of placenta, third trimester: Secondary | ICD-10-CM

## 2019-05-28 DIAGNOSIS — Z3A35 35 weeks gestation of pregnancy: Secondary | ICD-10-CM

## 2019-05-28 DIAGNOSIS — O0993 Supervision of high risk pregnancy, unspecified, third trimester: Secondary | ICD-10-CM

## 2019-05-28 DIAGNOSIS — O43199 Other malformation of placenta, unspecified trimester: Secondary | ICD-10-CM

## 2019-05-28 DIAGNOSIS — A5901 Trichomonal vulvovaginitis: Secondary | ICD-10-CM

## 2019-05-28 DIAGNOSIS — O24410 Gestational diabetes mellitus in pregnancy, diet controlled: Secondary | ICD-10-CM

## 2019-05-28 NOTE — Progress Notes (Signed)
I connected with  Chalet Kerwin on 05/28/19 at  3:15 PM EDT by mychart  and verified that I am speaking with the correct person using two identifiers.   I discussed the limitations, risks, security and privacy concerns of performing an evaluation and management service by telephone and the availability of in person appointments. I also discussed with the patient that there may be a patient responsible charge related to this service. The patient expressed understanding and agreed to proceed.  [place], CMA 05/28/2019  3:15 PM

## 2019-05-28 NOTE — Progress Notes (Signed)
[place] VIRTUAL VIDEO VISIT ENCOUNTER NOTE  Provider location: [facility] [employer] at [facility]   I connected with Nichole Bowers on 05/28/19 at  3:15 PM EDT by MyChart Video Encounter at home and verified that I am speaking with the correct person using two identifiers.   I discussed the limitations, risks, security and privacy concerns of performing an evaluation and management service by telephone and the availability of in person appointments. I also discussed with the patient that there may be a patient responsible charge related to this service. The patient expressed understanding and agreed to proceed. Subjective:  Nichole Bowers is a 28 y.o. G2P1001 at [redacted]w[redacted]d being seen today for ongoing prenatal care.  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  Patient reports no complaints.   .  .   . Denies any leaking of fluid.   The following portions of the patient's history were reviewed and updated as appropriate: allergies, current medications, past family history, past medical history, past social history, past surgical history and problem list.   Objective:  There were no vitals filed for this visit.  Fetal Status:           General:  Alert, oriented and cooperative. Patient is in no acute distress.  Respiratory: Normal respiratory effort, no problems with respiration noted  Mental Status: Normal mood and affect.  Normal behavior. Normal judgment and thought content.  Rest of physical exam deferred due to type of encounter  Imaging: [place] Mfm Ob Follow Up  Result Date: 05/09/2019 ----------------------------------------------------------------------  OBSTETRICS REPORT                       (Signed Final 05/09/2019 03:48 pm) ---------------------------------------------------------------------- Patient Info  ID #:       [id number]                          D.O.B.:  1991-03-16 (28 yrs)  Name:       Nichole Bowers               Visit Date: 05/09/2019 10:05 am ---------------------------------------------------------------------- Performed By  Performed By:     Valda Favia          Ref. Address:     [address]                                                             [address]  Attending:        Sander Nephew      [facility]  Referred By:      [facility] ---------------------------------------------------------------------- Orders   #  Description                          Code         Ordered By   1  [place] MFM OB FOLLOW UP                  56314.97     RAVI [facility]  ----------------------------------------------------------------------   #  Order #                    Accession #                 Episode #   1  [id number]                  [id number]                  [id number]  ---------------------------------------------------------------------- Indications   [redacted] weeks gestation of pregnancy                Z3A.33   Gestational diabetes in pregnancy,             O24.419   unspecified control (failed 2 hr gtt)   Encounter for other antenatal screening        Z36.2   follow-up (low risk NIPS, neg CF, neg AFP)   Previous cesarean delivery, antepartum         B09.628   Obesity complicating pregnancy, second         O99.212   trimester (pregravid BMI 38)   History of sickle cell trait                   Z86.2   ---------------------------------------------------------------------- Vital Signs                                                 Height:        5'3" ---------------------------------------------------------------------- Fetal Evaluation  Num Of Fetuses:         1  Fetal Heart Rate(bpm):  142  Cardiac Activity:       Observed  Presentation:           Cephalic  Placenta:               Right lateral  P. Cord Insertion:      Previously Visualized  Amniotic Fluid  AFI FV:      Within normal limits  AFI Sum(cm)     %Tile       Largest Pocket(cm)  13.52           44          5.06  RUQ(cm)       RLQ(cm)       LUQ(cm)        LLQ(cm)  5.06          0             4.24  4.22 ---------------------------------------------------------------------- Biometry  BPD:      84.1  mm     G. Age:  33w 6d         66  %    CI:        76.21   %    70 - 86                                                          FL/HC:      21.1   %    19.9 - 21.5  HC:      305.3  mm     G. Age:  34w 0d         35  %    HC/AC:      0.98        0.96 - 1.11  AC:      311.2  mm     G. Age:  35w 0d         93  %    FL/BPD:     76.6   %    71 - 87  FL:       64.4  mm     G. Age:  33w 2d         41  %    FL/AC:      20.7   %    20 - 24  HUM:      55.8  mm     G. Age:  32w 3d         45  %  Est. FW:    2405  gm      5 lb 5 oz     78  % ---------------------------------------------------------------------- OB History  Gravidity:    2         Term:   1        Prem:   0        SAB:   0  TOP:          0       Ectopic:  0        Living: 1 ---------------------------------------------------------------------- Gestational Age  LMP:           33w 1d        Date:  09/19/18                 EDD:   06/26/19  U/S Today:     34w 0d                                        EDD:   06/20/19  Best:          33w 1d     Det. By:  LMP  (09/19/18)          EDD:   06/26/19 ---------------------------------------------------------------------- Anatomy  Cranium:                Appears normal         Aortic Arch:            Previously seen  Cavum:  Previously seen        Ductal Arch:            Previously seen  Ventricles:            Appears normal         Diaphragm:              Previously seen  Choroid Plexus:        Previously seen        Stomach:                Appears normal, left                                                                        sided  Cerebellum:            Previously seen        Abdomen:                Previously seen  Posterior Fossa:       Previously seen        Abdominal Wall:         Previously seen  Nuchal Fold:           Previously seen        Cord Vessels:           Previously seen  Face:                  Orbits and profile     Kidneys:                Right sided                         previously seen                                                                        pyelectasis,  9 mm  Lips:                  Previously seen        Bladder:                Appears normal  Thoracic:              Appears normal         Spine:                  Previously seen  Heart:                 Previously seen        Upper Extremities:      Previously seen  RVOT:                  Previously seen        Lower Extremities:      Previously seen  LVOT:  Previously seen  Other:  Heels and right 5th digit visualized previously. ---------------------------------------------------------------------- Cervix Uterus Adnexa  Cervix  Not visualized (advanced GA >24wks)  Uterus  No abnormality visualized.  Left Ovary  No adnexal mass visualized.  Right Ovary  No adnexal mass visualized.  Cul De Sac  No free fluid seen.  Adnexa  No abnormality visualized. ---------------------------------------------------------------------- Impression  Normal interval growth.  Unilateral right renal pyelectasis noted again today.  A1GDM ---------------------------------------------------------------------- Recommendations  Follow up renal pyelectasis postnatally.   Initiate 2x weekly testing if medical therapy is required to  manage GDM ----------------------------------------------------------------------               Sander Nephew, MD Electronically Signed Final Report   05/09/2019 03:48 pm ----------------------------------------------------------------------  US Fetal Bpp W/nonstress  Result Date: 05/25/2019 ----------------------------------------------------------------------  OBSTETRICS REPORT                       (Signed Final 05/25/2019 04:02 pm) ---------------------------------------------------------------------- Patient Info  ID #:       [id number]                          D.O.B.:  09-11-1991 (27 yrs)  Name:       Nichole Bowers               Visit Date: 05/03/2019 03:09 pm ---------------------------------------------------------------------- Performed By  Performed By:     Shauna Hugh Day RNC          Ref. Address:     [address]                                                             [address]  Attending:        Clovia Cuff MD           Location:         [facility]                                                             [facility]  Referred By:      [facility] ---------------------------------------------------------------------- Orders   #  Description                          Code         Ordered By   1  US FETAL BPP W/NONSTRESS             06269.4      Emeterio Reeve  ----------------------------------------------------------------------   #  Order #                    Accession #  Episode #   1  [id number]                  [id number]                  [id number]  ---------------------------------------------------------------------- Service(s) Provided   US Fetal BPP W NST                                   [phone]  ---------------------------------------------------------------------- Indications   [redacted] weeks gestation of pregnancy                Z3A.32   Diabetes  - Gestational                         O24.419  ---------------------------------------------------------------------- Vital Signs                                                 Height:        5'3" ---------------------------------------------------------------------- Fetal Evaluation  Num Of Fetuses:         1  Preg. Location:         Intrauterine  Cardiac Activity:       Observed  Presentation:           Cephalic  Placenta:               Left lateral  Amniotic Fluid  AFI FV:      Subjectively low-normal  AFI Sum(cm)     %Tile       Largest Pocket(cm)  8.54            5           4.5  RUQ(cm)       RLQ(cm)       LUQ(cm)        LLQ(cm)  2.56          4.5           0              1.48 ---------------------------------------------------------------------- Biophysical Evaluation  Amniotic F.V:   Pocket => 2 cm two         F. Tone:        Observed                  planes  F. Movement:    Observed                   N.S.T:          Reactive  F. Breathing:   Observed                   Score:          10/10 ---------------------------------------------------------------------- OB History  Gravidity:    2         Term:   1        Prem:   0        SAB:   0  TOP:          0       Ectopic:  0        Living: 1 ---------------------------------------------------------------------- Gestational Age  LMP:           32w  2d        Date:  09/19/18                 EDD:   06/26/19  Best:          Milder[place] Meager 2d     Det. By:  LMP  (09/19/18)          EDD:   06/26/19 ---------------------------------------------------------------------- Comments  Technically limited exam due to maternal body habitus and  low normal amniotic fluid ---------------------------------------------------------------------- Impression  32 weeks GDM- reassuring testing ---------------------------------------------------------------------- Recommendations  Weekly BPP until delivery ----------------------------------------------------------------------                   Clovia Cuff, MD Electronically Signed Final Report   05/25/2019 04:02 pm ----------------------------------------------------------------------   Assessment and Plan:  Pregnancy: G2P1001 at [redacted]w[redacted]d 1. Supervision of high risk pregnancy, antepartum Fetal movement good  2. Marginal insertion of umbilical cord affecting management of mother Good fetal growth Has Growth and BPP on 7/8  3. Diet controlled gestational diabetes mellitus (GDM) in third trimester Continue diet controlled  4. Trichomonas vaginalis (TV) infection   5. History of cesarean section TOLAC   Preterm labor symptoms and general obstetric precautions including but not limited to vaginal bleeding, contractions, leaking of fluid and fetal movement were reviewed in detail with the patient. I discussed the assessment and treatment plan with the patient. The patient was provided an opportunity to ask questions and all were answered. The patient agreed with the plan and demonstrated an understanding of the instructions. The patient was advised to call back or seek an in-person office evaluation/go to [facility] at [facility] for any urgent or concerning symptoms. Please refer to After Visit Summary for other counseling recommendations.   I provided 11 minutes of face-to-face time during this encounter.  Return for HR OB f/u, In Office.  Future Appointments  Date Time Provider [place]  06/06/2019 10:00 AM [place] [facility]  06/06/2019 10:00 AM [facility] [place] 3 [facility]-MFCUS M[facility]-US    Calan Doren J Jakeob Tullis, DO [facility] [employer], [place]

## 2019-06-05 ENCOUNTER — Telehealth: Payer: Self-pay | Admitting: Medical

## 2019-06-05 NOTE — Telephone Encounter (Signed)
Called the patient to complete the pre-screen. The patient answered no to COVID19 symptoms and/or being previously diagnosed. Informed the patient of the wearing a face mask, sanitizing hands at the sanitizing station upon entering our office, and no visitors or children are allowed due to the COVID19 restrictions. The patient verbalized understanding. °

## 2019-06-06 ENCOUNTER — Ambulatory Visit (HOSPITAL_COMMUNITY): Payer: Medicaid Other | Admitting: *Deleted

## 2019-06-06 ENCOUNTER — Other Ambulatory Visit (HOSPITAL_COMMUNITY)
Admission: RE | Admit: 2019-06-06 | Discharge: 2019-06-06 | Disposition: A | Discharge status: Home / Self Care | Payer: Medicaid Other | Source: Ambulatory Visit | Attending: Obstetrics and Gynecology | Admitting: Obstetrics and Gynecology

## 2019-06-06 ENCOUNTER — Ambulatory Visit (HOSPITAL_COMMUNITY)
Admission: RE | Admit: 2019-06-06 | Discharge: 2019-06-06 | Disposition: A | Discharge status: Home / Self Care | Payer: Medicaid Other | Source: Ambulatory Visit | Attending: Maternal & Fetal Medicine | Admitting: Maternal & Fetal Medicine

## 2019-06-06 ENCOUNTER — Ambulatory Visit (INDEPENDENT_AMBULATORY_CARE_PROVIDER_SITE_OTHER): Payer: Medicaid Other | Admitting: Obstetrics and Gynecology

## 2019-06-06 ENCOUNTER — Encounter (HOSPITAL_COMMUNITY): Payer: Self-pay | Admitting: *Deleted

## 2019-06-06 ENCOUNTER — Other Ambulatory Visit: Payer: Self-pay

## 2019-06-06 VITALS — BP 115/78 | HR 94 | Temp 98.3°F | Wt 250.3 lb

## 2019-06-06 DIAGNOSIS — A749 Chlamydial infection, unspecified: Secondary | ICD-10-CM | POA: Diagnosis present

## 2019-06-06 DIAGNOSIS — N9089 Other specified noninflammatory disorders of vulva and perineum: Secondary | ICD-10-CM

## 2019-06-06 DIAGNOSIS — O358XX0 Maternal care for other (suspected) fetal abnormality and damage, not applicable or unspecified: Secondary | ICD-10-CM

## 2019-06-06 DIAGNOSIS — O0993 Supervision of high risk pregnancy, unspecified, third trimester: Secondary | ICD-10-CM

## 2019-06-06 DIAGNOSIS — O99210 Obesity complicating pregnancy, unspecified trimester: Secondary | ICD-10-CM | POA: Insufficient documentation

## 2019-06-06 DIAGNOSIS — O98813 Other maternal infectious and parasitic diseases complicating pregnancy, third trimester: Secondary | ICD-10-CM | POA: Insufficient documentation

## 2019-06-06 DIAGNOSIS — A5901 Trichomonal vulvovaginitis: Secondary | ICD-10-CM | POA: Insufficient documentation

## 2019-06-06 DIAGNOSIS — O24419 Gestational diabetes mellitus in pregnancy, unspecified control: Secondary | ICD-10-CM | POA: Diagnosis present

## 2019-06-06 DIAGNOSIS — O99213 Obesity complicating pregnancy, third trimester: Secondary | ICD-10-CM

## 2019-06-06 DIAGNOSIS — D573 Sickle-cell trait: Secondary | ICD-10-CM | POA: Insufficient documentation

## 2019-06-06 DIAGNOSIS — O283 Abnormal ultrasonic finding on antenatal screening of mother: Secondary | ICD-10-CM

## 2019-06-06 DIAGNOSIS — O99019 Anemia complicating pregnancy, unspecified trimester: Secondary | ICD-10-CM | POA: Insufficient documentation

## 2019-06-06 DIAGNOSIS — O43199 Other malformation of placenta, unspecified trimester: Secondary | ICD-10-CM | POA: Insufficient documentation

## 2019-06-06 DIAGNOSIS — O35EXX0 Maternal care for other (suspected) fetal abnormality and damage, fetal genitourinary anomalies, not applicable or unspecified: Secondary | ICD-10-CM

## 2019-06-06 DIAGNOSIS — Z3A37 37 weeks gestation of pregnancy: Secondary | ICD-10-CM

## 2019-06-06 DIAGNOSIS — Z98891 History of uterine scar from previous surgery: Secondary | ICD-10-CM | POA: Diagnosis present

## 2019-06-06 DIAGNOSIS — Z362 Encounter for other antenatal screening follow-up: Secondary | ICD-10-CM

## 2019-06-06 DIAGNOSIS — O43193 Other malformation of placenta, third trimester: Secondary | ICD-10-CM

## 2019-06-06 DIAGNOSIS — O0990 Supervision of high risk pregnancy, unspecified, unspecified trimester: Secondary | ICD-10-CM

## 2019-06-06 DIAGNOSIS — O24410 Gestational diabetes mellitus in pregnancy, diet controlled: Secondary | ICD-10-CM | POA: Insufficient documentation

## 2019-06-06 DIAGNOSIS — O34219 Maternal care for unspecified type scar from previous cesarean delivery: Secondary | ICD-10-CM

## 2019-06-06 NOTE — Progress Notes (Signed)
Records Glucose on paper log but forgot paper at home & forgets to do it on BRx.

## 2019-06-06 NOTE — Progress Notes (Signed)
Prenatal Visit Note Date: 06/06/2019 Clinic: [facility]  Subjective:  Nichole Bowers is a 28 y.o. G2P1001 at [redacted]w[redacted]d being seen today for ongoing prenatal care.  She is currently monitored for the following issues for this high-risk pregnancy and has Marijuana use; Ventral hernia without obstruction or gangrene; Supervision of high risk pregnancy, antepartum; History of cesarean section; Sickle cell trait ([place]); Obesity in pregnancy; Chlamydia infection affecting pregnancy; Trichomonas vaginalis (TV) infection; Sickle cell trait in mother affecting pregnancy ([place]); Marginal insertion of umbilical cord affecting management of mother; and GDM (gestational diabetes mellitus) on their problem list.  Patient reports no complaints.   Contractions: Irritability. Vag. Bleeding: None.  Movement: Present. Denies leaking of fluid.   The following portions of the patient's history were reviewed and updated as appropriate: allergies, current medications, past family history, past medical history, past social history, past surgical history and problem list. Problem list updated.  Objective:   Vitals:   06/06/19 1505  BP: 115/78  Pulse: 94  Temp: 98.3 F (36.8 C)  Weight: 250 lb 4.8 oz (113.5 kg)    Fetal Status: Fetal Heart Rate (bpm): 145   Movement: Present  Presentation: Vertex  General:  Alert, oriented and cooperative. Patient is in no acute distress.  Skin: Skin is warm and dry. No rash noted.   Cardiovascular: Normal heart rate noted  Respiratory: Normal respiratory effort, no problems with respiration noted  Abdomen: Soft, gravid, appropriate for gestational age. Pain/Pressure: Present     Pelvic:  Cervical exam performed Dilation: Fingertip Effacement (%): Thick Station: Ballotable  Extremities: Normal range of motion.  Edema: Trace  Mental Status: Normal mood and affect. Normal behavior. Normal judgment and thought content.   Urinalysis:      Assessment and Plan:   Pregnancy: G2P1001 at [redacted]w[redacted]d  1. Supervision of high risk pregnancy, antepartum Routine care.  - Strep Gp B NAA - Cervicovaginal ancillary only - Herpes simplex virus culture  2. Labial lesion At 5 o'clock on left labia. No prior history and no h/o hsv.  - Herpes simplex virus culture  3. Diet controlled gestational diabetes mellitus (GDM) in third trimester Patient gives normal BS. Growth scan done yesterday. Results pending  4. History of cesarean section tolac form signed. H/o arrest of dilation  5. Obesity in pregnancy No issues  6. Trichomonas vaginalis (TV) infection toc today  7. Marginal insertion of umbilical cord affecting management of mother Normal growth.   Term labor symptoms and general obstetric precautions including but not limited to vaginal bleeding, contractions, leaking of fluid and fetal movement were reviewed in detail with the patient. Please refer to After Visit Summary for other counseling recommendations.  Return in about 1 week (around 06/13/2019).   Aletha Halim, MD

## 2019-06-08 LAB — CERVICOVAGINAL ANCILLARY ONLY
Chlamydia: NEGATIVE
Neisseria Gonorrhea: NEGATIVE
Trichomonas: NEGATIVE

## 2019-06-08 LAB — STREP GP B NAA: Strep Gp B NAA: POSITIVE — AB

## 2019-06-08 LAB — HERPES SIMPLEX VIRUS CULTURE

## 2019-06-11 ENCOUNTER — Encounter: Payer: Self-pay | Admitting: Obstetrics and Gynecology

## 2019-06-11 DIAGNOSIS — O99820 Streptococcus B carrier state complicating pregnancy: Secondary | ICD-10-CM | POA: Insufficient documentation

## 2019-06-13 ENCOUNTER — Ambulatory Visit (INDEPENDENT_AMBULATORY_CARE_PROVIDER_SITE_OTHER): Payer: Medicaid Other | Admitting: *Deleted

## 2019-06-13 ENCOUNTER — Other Ambulatory Visit: Payer: Self-pay

## 2019-06-13 ENCOUNTER — Encounter (HOSPITAL_COMMUNITY): Payer: Self-pay | Admitting: *Deleted

## 2019-06-13 ENCOUNTER — Ambulatory Visit: Payer: Self-pay

## 2019-06-13 ENCOUNTER — Ambulatory Visit (INDEPENDENT_AMBULATORY_CARE_PROVIDER_SITE_OTHER): Payer: Medicaid Other | Admitting: Medical

## 2019-06-13 ENCOUNTER — Encounter: Payer: Self-pay | Admitting: Medical

## 2019-06-13 ENCOUNTER — Inpatient Hospital Stay (HOSPITAL_COMMUNITY)
Admission: AD | Admit: 2019-06-13 | Discharge: 2019-06-16 | DRG: 806 | Disposition: A | Discharge status: Home / Self Care | Payer: Medicaid Other | Attending: Family Medicine | Admitting: Family Medicine

## 2019-06-13 VITALS — BP 123/77 | HR 98 | Wt 250.3 lb

## 2019-06-13 DIAGNOSIS — O99324 Drug use complicating childbirth: Secondary | ICD-10-CM | POA: Diagnosis present

## 2019-06-13 DIAGNOSIS — A5901 Trichomonal vulvovaginitis: Secondary | ICD-10-CM

## 2019-06-13 DIAGNOSIS — O98813 Other maternal infectious and parasitic diseases complicating pregnancy, third trimester: Secondary | ICD-10-CM

## 2019-06-13 DIAGNOSIS — D573 Sickle-cell trait: Secondary | ICD-10-CM

## 2019-06-13 DIAGNOSIS — Z30017 Encounter for initial prescription of implantable subdermal contraceptive: Secondary | ICD-10-CM

## 2019-06-13 DIAGNOSIS — Z20828 Contact with and (suspected) exposure to other viral communicable diseases: Secondary | ICD-10-CM | POA: Diagnosis present

## 2019-06-13 DIAGNOSIS — O99213 Obesity complicating pregnancy, third trimester: Secondary | ICD-10-CM

## 2019-06-13 DIAGNOSIS — O0990 Supervision of high risk pregnancy, unspecified, unspecified trimester: Secondary | ICD-10-CM

## 2019-06-13 DIAGNOSIS — O4103X0 Oligohydramnios, third trimester, not applicable or unspecified: Principal | ICD-10-CM | POA: Diagnosis present

## 2019-06-13 DIAGNOSIS — Z3A38 38 weeks gestation of pregnancy: Secondary | ICD-10-CM

## 2019-06-13 DIAGNOSIS — O0993 Supervision of high risk pregnancy, unspecified, third trimester: Secondary | ICD-10-CM

## 2019-06-13 DIAGNOSIS — O99824 Streptococcus B carrier state complicating childbirth: Secondary | ICD-10-CM | POA: Diagnosis present

## 2019-06-13 DIAGNOSIS — O34219 Maternal care for unspecified type scar from previous cesarean delivery: Secondary | ICD-10-CM | POA: Diagnosis present

## 2019-06-13 DIAGNOSIS — O24410 Gestational diabetes mellitus in pregnancy, diet controlled: Secondary | ICD-10-CM

## 2019-06-13 DIAGNOSIS — O24420 Gestational diabetes mellitus in childbirth, diet controlled: Secondary | ICD-10-CM | POA: Diagnosis present

## 2019-06-13 DIAGNOSIS — F1290 Cannabis use, unspecified, uncomplicated: Secondary | ICD-10-CM | POA: Diagnosis present

## 2019-06-13 DIAGNOSIS — O99019 Anemia complicating pregnancy, unspecified trimester: Secondary | ICD-10-CM

## 2019-06-13 DIAGNOSIS — O99210 Obesity complicating pregnancy, unspecified trimester: Secondary | ICD-10-CM

## 2019-06-13 DIAGNOSIS — O43193 Other malformation of placenta, third trimester: Secondary | ICD-10-CM

## 2019-06-13 DIAGNOSIS — O24429 Gestational diabetes mellitus in childbirth, unspecified control: Secondary | ICD-10-CM | POA: Diagnosis not present

## 2019-06-13 DIAGNOSIS — A749 Chlamydial infection, unspecified: Secondary | ICD-10-CM

## 2019-06-13 DIAGNOSIS — O9902 Anemia complicating childbirth: Secondary | ICD-10-CM | POA: Diagnosis present

## 2019-06-13 DIAGNOSIS — O43123 Velamentous insertion of umbilical cord, third trimester: Secondary | ICD-10-CM | POA: Diagnosis present

## 2019-06-13 DIAGNOSIS — O99214 Obesity complicating childbirth: Secondary | ICD-10-CM | POA: Diagnosis not present

## 2019-06-13 DIAGNOSIS — Z87891 Personal history of nicotine dependence: Secondary | ICD-10-CM

## 2019-06-13 DIAGNOSIS — O99820 Streptococcus B carrier state complicating pregnancy: Secondary | ICD-10-CM

## 2019-06-13 DIAGNOSIS — Z98891 History of uterine scar from previous surgery: Secondary | ICD-10-CM

## 2019-06-13 DIAGNOSIS — O43199 Other malformation of placenta, unspecified trimester: Secondary | ICD-10-CM

## 2019-06-13 LAB — SARS CORONAVIRUS 2 BY RT PCR (HOSPITAL ORDER, PERFORMED IN ~~LOC~~ HOSPITAL LAB): SARS Coronavirus 2: NEGATIVE

## 2019-06-13 LAB — CBC
HCT: 33.6 % — ABNORMAL LOW (ref 36.0–46.0)
Hemoglobin: 11 g/dL — ABNORMAL LOW (ref 12.0–15.0)
MCH: 28.3 pg (ref 26.0–34.0)
MCHC: 32.7 g/dL (ref 30.0–36.0)
MCV: 86.4 fL (ref 80.0–100.0)
Platelets: 252 10*3/uL (ref 150–400)
RBC: 3.89 MIL/uL (ref 3.87–5.11)
RDW: 14.2 % (ref 11.5–15.5)
WBC: 8 10*3/uL (ref 4.0–10.5)
nRBC: 0 % (ref 0.0–0.2)

## 2019-06-13 LAB — TYPE AND SCREEN
ABO/RH(D): AB POS
Antibody Screen: NEGATIVE

## 2019-06-13 LAB — GLUCOSE, CAPILLARY: Glucose-Capillary: 76 mg/dL (ref 70–99)

## 2019-06-13 LAB — ABO/RH: ABO/RH(D): AB POS

## 2019-06-13 MED ORDER — OXYTOCIN 40 UNITS IN NORMAL SALINE INFUSION - SIMPLE MED
2.5000 [IU]/h | INTRAVENOUS | Status: DC
  Filled 2019-06-13: qty 1000

## 2019-06-13 MED ORDER — ONDANSETRON HCL 4 MG/2ML IJ SOLN: 4.0000 mg | Freq: Four times a day (QID) | INTRAMUSCULAR | Status: DC | PRN

## 2019-06-13 MED ORDER — ACETAMINOPHEN 325 MG PO TABS: 650.0000 mg | ORAL_TABLET | ORAL | Status: DC | PRN

## 2019-06-13 MED ORDER — FLEET ENEMA 7-19 GM/118ML RE ENEM: 1.0000 | ENEMA | RECTAL | Status: DC | PRN

## 2019-06-13 MED ORDER — OXYTOCIN BOLUS FROM INFUSION
500.0000 mL | Freq: Once | INTRAVENOUS | Status: AC
  Administered 2019-06-14: 22:00:00 500 mL via INTRAVENOUS

## 2019-06-13 MED ORDER — TERBUTALINE SULFATE 1 MG/ML IJ SOLN: 0.2500 mg | Freq: Once | INTRAMUSCULAR | Status: DC | PRN

## 2019-06-13 MED ORDER — LACTATED RINGERS IV SOLN: 500.0000 mL | INTRAVENOUS | Status: DC | PRN

## 2019-06-13 MED ORDER — OXYCODONE-ACETAMINOPHEN 5-325 MG PO TABS: 1.0000 | ORAL_TABLET | ORAL | Status: DC | PRN

## 2019-06-13 MED ORDER — OXYCODONE-ACETAMINOPHEN 5-325 MG PO TABS: 2.0000 | ORAL_TABLET | ORAL | Status: DC | PRN

## 2019-06-13 MED ORDER — SOD CITRATE-CITRIC ACID 500-334 MG/5ML PO SOLN: 30.0000 mL | ORAL | Status: DC | PRN

## 2019-06-13 MED ORDER — SODIUM CHLORIDE 0.9 % IV SOLN
5.0000 10*6.[IU] | Freq: Once | INTRAVENOUS | Status: AC
  Administered 2019-06-13: 19:00:00 5 10*6.[IU] via INTRAVENOUS
  Filled 2019-06-13: qty 5

## 2019-06-13 MED ORDER — LACTATED RINGERS IV SOLN
INTRAVENOUS | Status: DC
  Administered 2019-06-13 – 2019-06-14 (×3): via INTRAVENOUS

## 2019-06-13 MED ORDER — LIDOCAINE HCL (PF) 1 % IJ SOLN: 30.0000 mL | INTRAMUSCULAR | Status: DC | PRN

## 2019-06-13 MED ORDER — OXYTOCIN 40 UNITS IN NORMAL SALINE INFUSION - SIMPLE MED
1.0000 m[IU]/min | INTRAVENOUS | Status: DC
  Administered 2019-06-13: 19:00:00 1 m[IU]/min via INTRAVENOUS

## 2019-06-13 MED ORDER — PENICILLIN G 3 MILLION UNITS IVPB - SIMPLE MED
3.0000 10*6.[IU] | INTRAVENOUS | Status: DC
  Administered 2019-06-13 – 2019-06-14 (×6): 3 10*6.[IU] via INTRAVENOUS
  Filled 2019-06-13 (×6): qty 100

## 2019-06-13 NOTE — Progress Notes (Signed)
   PRENATAL VISIT NOTE  Subjective:  Nichole Bowers is a 28 y.o. G2P1001 at [redacted]w[redacted]d being seen today for ongoing prenatal care.  She is currently monitored for the following issues for this high-risk pregnancy and has Marijuana use; Ventral hernia without obstruction or gangrene; Supervision of high risk pregnancy, antepartum; History of cesarean section; Sickle cell trait ([place]); Obesity in pregnancy; Chlamydia infection affecting pregnancy; Trichomonas vaginalis (TV) infection; Sickle cell trait in mother affecting pregnancy ([place]); Marginal insertion of umbilical cord affecting management of mother; GDM (gestational diabetes mellitus); and GBS (group B Streptococcus carrier), +RV culture, currently pregnant on their problem list.  Patient reports occasional contractions.  Contractions: Irregular. Vag. Bleeding: None.  Movement: (!) Decreased. Denies leaking of fluid.   The following portions of the patient's history were reviewed and updated as appropriate: allergies, current medications, past family history, past medical history, past social history, past surgical history and problem list.   Objective:   Vitals:   06/13/19 1526  BP: 123/77  Pulse: 98  Weight: 250 lb 4.8 oz (113.5 kg)    Fetal Status: Fetal Heart Rate (bpm): NST   Movement: (!) Decreased     General:  Alert, oriented and cooperative. Patient is in no acute distress.  Skin: Skin is warm and dry. No rash noted.   Cardiovascular: Normal heart rate noted  Respiratory: Normal respiratory effort, no problems with respiration noted  Abdomen: Soft, gravid, appropriate for gestational age.  Pain/Pressure: Present     Pelvic: Cervical exam deferred        Extremities: Normal range of motion.     Mental Status: Normal mood and affect. Normal behavior. Normal judgment and thought content.   Assessment and Plan:  Pregnancy: G2P1001 at [redacted]w[redacted]d 1. Supervision of high risk pregnancy, antepartum  2. Diet controlled gestational  diabetes mellitus (GDM) in third trimester - Patient did not bring log today - States that she is checking CBGs 2-3x per day,but not consistently - Fasting BS in 90s and PP 105-140 reported  - BPP 6/8 today, NST reviewed and reactive  Fetal Monitoring: Baseline: 120 bpm Variability: moderate Accelerations: 15 x 15 Decelerations: none Contractions: few, irregular, mild - Discussed patient with Dr. Rosana Hoes. Agrees with plan for direct admission to L&D for IOL today  - Informed Dr. Elly Modena and Dr. Juleen China of patient and advised patient of plan and to go directly to [facility] for IOL  3. History of cesarean section - Planning TOLAC  4. Sickle cell trait ([place])   5. Oligohydramnios antepartum, third trimester, not applicable or unspecified fetus   6. GBS (group B Streptococcus carrier), +RV culture, currently pregnant - Treat in labor, patient aware  7. Marginal insertion of umbilical cord affecting management of mother  8. Obesity in pregnancy  Term labor symptoms and general obstetric precautions including but not limited to vaginal bleeding, contractions, leaking of fluid and fetal movement were reviewed in detail with the patient. Please refer to After Visit Summary for other counseling recommendations.   Return in about 6 weeks (around 07/25/2019) for PP visit.  No future appointments.  Kerry Hough, PA-C

## 2019-06-13 NOTE — Patient Instructions (Signed)
Fetal Movement Counts Patient Name: ________________________________________________ Patient Due Date: ____________________ What is a fetal movement count?  A fetal movement count is the number of times that you feel your baby move during a certain amount of time. This may also be called a fetal kick count. A fetal movement count is recommended for every pregnant woman. You may be asked to start counting fetal movements as early as week 28 of your pregnancy. Pay attention to when your baby is most active. You may notice your baby's sleep and wake cycles. You may also notice things that make your baby move more. You should do a fetal movement count:  When your baby is normally most active.  At the same time each day. A good time to count movements is while you are resting, after having something to eat and drink. How do I count fetal movements? 1. Find a quiet, comfortable area. Sit, or lie down on your side. 2. Write down the date, the start time and stop time, and the number of movements that you felt between those two times. Take this information with you to your health care visits. 3. For 2 hours, count kicks, flutters, swishes, rolls, and jabs. You should feel at least 10 movements during 2 hours. 4. You may stop counting after you have felt 10 movements. 5. If you do not feel 10 movements in 2 hours, have something to eat and drink. Then, keep resting and counting for 1 hour. If you feel at least 4 movements during that hour, you may stop counting. Contact a health care provider if:  You feel fewer than 4 movements in 2 hours.  Your baby is not moving like he or she usually does. Date: ____________ Start time: ____________ Stop time: ____________ Movements: ____________ Date: ____________ Start time: ____________ Stop time: ____________ Movements: ____________ Date: ____________ Start time: ____________ Stop time: ____________ Movements: ____________ Date: ____________ Start time:  ____________ Stop time: ____________ Movements: ____________ Date: ____________ Start time: ____________ Stop time: ____________ Movements: ____________ Date: ____________ Start time: ____________ Stop time: ____________ Movements: ____________ Date: ____________ Start time: ____________ Stop time: ____________ Movements: ____________ Date: ____________ Start time: ____________ Stop time: ____________ Movements: ____________ Date: ____________ Start time: ____________ Stop time: ____________ Movements: ____________ This information is not intended to replace advice given to you by your health care provider. Make sure you discuss any questions you have with your health care provider. Document Released: 12/15/2006 Document Revised: 12/05/2018 Document Reviewed: 12/25/2015 Elsevier Patient Education  2020 Elsevier Inc. Braxton Hicks Contractions Contractions of the uterus can occur throughout pregnancy, but they are not always a sign that you are in labor. You may have practice contractions called Braxton Hicks contractions. These false labor contractions are sometimes confused with true labor. What are Braxton Hicks contractions? Braxton Hicks contractions are tightening movements that occur in the muscles of the uterus before labor. Unlike true labor contractions, these contractions do not result in opening (dilation) and thinning of the cervix. Toward the end of pregnancy (32-34 weeks), Braxton Hicks contractions can happen more often and may become stronger. These contractions are sometimes difficult to tell apart from true labor because they can be very uncomfortable. You should not feel embarrassed if you go to the hospital with false labor. Sometimes, the only way to tell if you are in true labor is for your health care provider to look for changes in the cervix. The health care provider will do a physical exam and may monitor your contractions. If you   are not in true labor, the exam should show  that your cervix is not dilating and your water has not broken. If there are no other health problems associated with your pregnancy, it is completely safe for you to be sent home with false labor. You may continue to have Braxton Hicks contractions until you go into true labor. How to tell the difference between true labor and false labor True labor  Contractions last 30-70 seconds.  Contractions become very regular.  Discomfort is usually felt in the top of the uterus, and it spreads to the lower abdomen and low back.  Contractions do not go away with walking.  Contractions usually become more intense and increase in frequency.  The cervix dilates and gets thinner. False labor  Contractions are usually shorter and not as strong as true labor contractions.  Contractions are usually irregular.  Contractions are often felt in the front of the lower abdomen and in the groin.  Contractions may go away when you walk around or change positions while lying down.  Contractions get weaker and are shorter-lasting as time goes on.  The cervix usually does not dilate or become thin. Follow these instructions at home:   Take over-the-counter and prescription medicines only as told by your health care provider.  Keep up with your usual exercises and follow other instructions from your health care provider.  Eat and drink lightly if you think you are going into labor.  If Braxton Hicks contractions are making you uncomfortable: ? Change your position from lying down or resting to walking, or change from walking to resting. ? Sit and rest in a tub of warm water. ? Drink enough fluid to keep your urine pale yellow. Dehydration may cause these contractions. ? Do slow and deep breathing several times an hour.  Keep all follow-up prenatal visits as told by your health care provider. This is important. Contact a health care provider if:  You have a fever.  You have continuous pain in  your abdomen. Get help right away if:  Your contractions become stronger, more regular, and closer together.  You have fluid leaking or gushing from your vagina.  You pass blood-tinged mucus (bloody show).  You have bleeding from your vagina.  You have low back pain that you never had before.  You feel your baby's head pushing down and causing pelvic pressure.  Your baby is not moving inside you as much as it used to. Summary  Contractions that occur before labor are called Braxton Hicks contractions, false labor, or practice contractions.  Braxton Hicks contractions are usually shorter, weaker, farther apart, and less regular than true labor contractions. True labor contractions usually become progressively stronger and regular, and they become more frequent.  Manage discomfort from Braxton Hicks contractions by changing position, resting in a warm bath, drinking plenty of water, or practicing deep breathing. This information is not intended to replace advice given to you by your health care provider. Make sure you discuss any questions you have with your health care provider. Document Released: 03/31/2017 Document Revised: 10/28/2017 Document Reviewed: 03/31/2017 Elsevier Patient Education  2020 Elsevier Inc.  

## 2019-06-13 NOTE — MAU Note (Signed)
Covid swab collected. Pt tolerated well. Pt asymptomatic 

## 2019-06-13 NOTE — Progress Notes (Signed)
Pt reports increased frequency of UC's. She requests Cx exam today. Fetal testing initiated today due to oligohydramnios on [place] 7/8. Pt states decreased FM x5 days.

## 2019-06-13 NOTE — H&P (Signed)
LABOR AND DELIVERY ADMISSION HISTORY AND PHYSICAL NOTE  Nichole Bowers is a 28 y.o. female G2P1001 with IUP at 30w1dby LMP presenting for IOL due to poorly controlled A1DM with oligohydramnios. She reports positive fetal movement. She denies leakage of fluid or vaginal bleeding.  [facility]:  - Poorly controlled A1DM - Hx cesarean section - Sickle cell trait - Oligohydramnios - GBS positive - Marginal insertion of umbilical cord - Obesity  - Marijuana use  Past Medical History: Past Medical History:  Diagnosis Date  . Atrial fibrillation ([place]   . Chlamydia 2019  . Cocaine use    Positive drug screen on 11/03/15  . Gestational diabetes   . Marijuana use    Positive drug screen on 11/03/15  . Sickle cell trait ([place]   . Urinary tract infection   . Ventral hernia without obstruction or gangrene     Past Surgical History: Past Surgical History:  Procedure Laterality Date  . CESAREAN SECTION N/A 06/25/2016   Procedure: CESAREAN SECTION;  Surgeon: UOsborne Oman MD;  [facility]  Service: Obstetrics;  Laterality: N/A;  . HERNIA REPAIR  2017  . WISDOM TOOTH EXTRACTION      Obstetrical History: OB History    Gravida  2   Para  1   Term  1   Preterm  0   AB  0   Living  1     SAB  0   TAB  0   Ectopic  0   Multiple  0   Live Births  1           Social History: Social History   Socioeconomic History  . Marital status: Single    Spouse name: Not on file  . Number of children: Not on file  . Years of education: Not on file  . Highest education level: Not on file  Occupational History  . Occupation: wBiochemist, clinical Social Needs  . Financial resource strain: Not on file  . Food insecurity    Worry: Never true    Inability: Never true  . Transportation needs    Medical: No    Non-medical: No  Tobacco Use  . Smoking status: Former Smoker    Types:  Cigarettes    Quit date: 10/29/2018    Years since quitting: 0.6  . Smokeless tobacco: Never Used  Substance and Sexual Activity  . Alcohol use: Not Currently  . Drug use: Not Currently    Types: Cocaine, Marijuana    Comment: marijuana 1/30 ;cocaine use- has been years  . Sexual activity: Yes    Birth control/protection: None  Lifestyle  . Physical activity    Days per week: Not on file    Minutes per session: Not on file  . Stress: Not on file  Relationships  . Social [place] phone: Not on file    Gets together: Not on file    Attends religious service: Not on file    Active member of club or organization: Not on file    Attends meetings of clubs or organizations: Not on file    Relationship status: Not on file  Other Topics Concern  . Not on file  Social History Narrative  . Not on file    Family History: Family History  Problem Relation Age of Onset  . Healthy Mother   . Healthy Father   . Asthma Sister   .  Diabetes Maternal Grandfather   . Diabetes Paternal Grandfather     Allergies: Allergies  Allergen Reactions  . Phenergan [Promethazine] Nausea And Vomiting    Medications Prior to Admission  Medication Sig Dispense Refill Last Dose  . acetaminophen (TYLENOL) 325 MG tablet Take 650 mg by mouth every 6 (six) hours as needed for mild pain or headache.   Past Month at Unknown time  . Prenatal Multivit-Min-Fe-FA (PRENATAL VITAMINS) 0.8 MG tablet Take 1 tablet by mouth daily. 30 tablet 12 06/13/2019 at Unknown time  . Accu-Chek FastClix Lancets MISC 1 each by Percutaneous route 4 (four) times daily. 100 each 12   . aspirin EC 81 MG tablet Take 1 tablet (81 mg total) by mouth daily. (Patient not taking: Reported on 06/13/2019) 90 tablet 1 Not Taking at Unknown time  . Blood Glucose Monitoring Suppl (ACCU-CHEK GUIDE) w/Device KIT 1 Device by Does not apply route 4 (four) times daily. 1 kit 0   . Elastic Bandages & Supports (COMFORT FIT MATERNITY SUPP  LG) MISC 1 each by Does not apply route daily as needed. (Patient not taking: Reported on 06/13/2019) 1 each 0   . glucose blood (ACCU-CHEK GUIDE) test strip Use as instructed 50 each 12     Review of Systems  All systems reviewed and negative except as stated in HPI  Physical Exam Blood pressure 121/81, pulse (!) 103, temperature 98.7 F (37.1 C), temperature source Oral, resp. rate 16, height 5' 3"  (1.6 m), weight 113.5 kg, last menstrual period 09/19/2018. General appearance: Alert, oriented, NAD Lungs: Normal respiratory effort Heart: Regular rate Abdomen: Soft, non-tender; gravid, FH appropriate for GA Extremities: No calf swelling or tenderness Presentation: Cephalic confirmed via bedside US Fetal monitoring: Baseline rate 135, moderate variability, + accels, - decels Uterine activity: Occasional contractions Dilation: Closed Effacement (%): Thick Station: Ballotable Exam by:: Dr. Gwenlyn Perking   Prenatal labs: ABO, Rh: AB/Positive/-- (01/17 1208) Antibody: Negative (01/17 1208) Rubella: 1.41 (01/17 1208) RPR: Non Reactive (04/30 0833)  HBsAg: Negative (01/17 1208)  HIV: Non Reactive (04/30 0833)  GC/Chlamydia: Negative GBS: Positive (07/08 1603)  2-hr GTT: 136 Genetic screening: Negative Anatomy US: No fetal anomalies noted  Prenatal Transfer Tool  Maternal Diabetes: Yes:  Diabetes Type:  Diet controlled Genetic Screening: Normal Maternal Ultrasounds/Referrals: Oligohydramnios; marginal insertion of umbilical cord Fetal Ultrasounds or other Referrals:  None Maternal Substance Abuse:  Yes:  Type: Marijuana Significant Maternal Medications: Meds include: ASA 81 mg, PNV Significant Maternal Lab Results: Group B Strep positive  Results for orders placed or performed during the hospital encounter of 06/13/19 (from the past 24 hour(s))  CBC   Collection Time: 06/13/19  6:51 PM  Result Value Ref Range   WBC 8.0 4.0 - 10.5 K/uL   RBC 3.89 3.87 - 5.11 MIL/uL   Hemoglobin 11.0  (L) 12.0 - 15.0 g/dL   HCT 33.6 (L) 36.0 - 46.0 %   MCV 86.4 80.0 - 100.0 fL   MCH 28.3 26.0 - 34.0 pg   MCHC 32.7 30.0 - 36.0 g/dL   RDW 14.2 11.5 - 15.5 %   Platelets 252 150 - 400 K/uL   nRBC 0.0 0.0 - 0.2 %    Patient Active Problem List   Diagnosis Date Noted  . Indication for care in labor or delivery 06/13/2019  . GBS (group B Streptococcus carrier), +RV culture, currently pregnant 06/11/2019  . GDM (gestational diabetes mellitus) 04/10/2019  . Sickle cell trait in mother affecting pregnancy ([place]) 02/08/2019  .  Marginal insertion of umbilical cord affecting management of mother 02/08/2019  . Obesity in pregnancy 01/25/2019  . Chlamydia infection affecting pregnancy 01/25/2019  . Trichomonas vaginalis (TV) infection 01/25/2019  . Sickle cell trait ([place]) 12/28/2018  . Supervision of high risk pregnancy, antepartum 12/15/2018  . History of cesarean section 12/15/2018  . Ventral hernia without obstruction or gangrene 05/05/2016  . Marijuana use 11/08/2015    Assessment: Clairissa Valvano is a 28 y.o. G2P1001 at 80w1dhere for IOL due to poorly controlled A1DM and oligohydramnios.  #Labor: Planning for TOLAC (consent in chart); will start induction with Pitocin 1x1 given cervix is closed and thick. Will plan to try for FB on next evaluation. #Pain: None for now; considering epidural #FWB: Reactive Category 1 tracing as above #ID: GBS positive; antibiotics ordered #MOF: Breast  #[place]: [place]7/15/2020, 7:20 PM

## 2019-06-13 NOTE — Progress Notes (Signed)
LABOR PROGRESS NOTE  Nichole Bowers is a 28 y.o. G2P1001 at [redacted]w[redacted]d  admitted for  IOL due to A1GDM poorly controlled.   Subjective: She is doing well resting in bed with minimal discomfort with contractions.   Objective: BP 120/73   Pulse 93   Temp 98.7 F (37.1 C) (Oral)   Resp 18   Ht 5\' 3"  (1.6 m)   Wt 113.5 kg   LMP 09/19/2018 (Exact Date)   BMI 44.32 kg/m  or  Vitals:   06/13/19 1843 06/13/19 1933 06/13/19 2107  BP: 121/81 (!) 113/55 120/73  Pulse: (!) 103 93 93  Resp: 16  18  Temp: 98.7 F (37.1 C)    TempSrc: Oral    Weight: 113.5 kg    Height: 5\' 3"  (1.6 m)      Dilation: Closed Effacement (%): Thick Station: Ballotable Presentation: Vertex(verified via bedside US) Exam by:: Nichole Bowers: baseline rate 130, moderate varibility, 15 x 15 acel, no decel Toco: 1-3 mins  Labs: Lab Results  Component Value Date   WBC 8.0 06/13/2019   HGB 11.0 (L) 06/13/2019   HCT 33.6 (L) 06/13/2019   MCV 86.4 06/13/2019   PLT 252 06/13/2019    Patient Active Problem List   Diagnosis Date Noted  . Indication for care in labor or delivery 06/13/2019  . GBS (group B Streptococcus carrier), +RV culture, currently pregnant 06/11/2019  . GDM (gestational diabetes mellitus) 04/10/2019  . Sickle cell trait in mother affecting pregnancy ([place]) 02/08/2019  . Marginal insertion of umbilical cord affecting management of mother 02/08/2019  . Obesity in pregnancy 01/25/2019  . Chlamydia infection affecting pregnancy 01/25/2019  . Trichomonas vaginalis (TV) infection 01/25/2019  . Sickle cell trait ([place]) 12/28/2018  . Supervision of high risk pregnancy, antepartum 12/15/2018  . History of cesarean section 12/15/2018  . Ventral hernia without obstruction or gangrene 05/05/2016  . Marijuana use 11/08/2015    Assessment / Plan: 28 y.o. G2P1001 at [redacted]w[redacted]d here for IOL due to A1GDM poorly controlled.  Labor: Planning for TOLAC. Pitocin 75mU/min. Continue cervical exams for  progression of labor Fetal Wellbeing:  Cat I Pain Control:  Maternally supported. Planning for epidural. Anticipated MOD:  Vaginal  Nichole Bowers, D.O. Family Medicine Resident, PGY-1 06/13/2019, 10:33 PM

## 2019-06-14 ENCOUNTER — Inpatient Hospital Stay (HOSPITAL_COMMUNITY): Payer: Medicaid Other | Admitting: Anesthesiology

## 2019-06-14 ENCOUNTER — Encounter: Payer: Self-pay | Admitting: *Deleted

## 2019-06-14 ENCOUNTER — Encounter (HOSPITAL_COMMUNITY): Payer: Self-pay

## 2019-06-14 DIAGNOSIS — O4103X0 Oligohydramnios, third trimester, not applicable or unspecified: Secondary | ICD-10-CM

## 2019-06-14 DIAGNOSIS — Z3A38 38 weeks gestation of pregnancy: Secondary | ICD-10-CM

## 2019-06-14 DIAGNOSIS — O99214 Obesity complicating childbirth: Secondary | ICD-10-CM

## 2019-06-14 DIAGNOSIS — O99824 Streptococcus B carrier state complicating childbirth: Secondary | ICD-10-CM

## 2019-06-14 DIAGNOSIS — O24429 Gestational diabetes mellitus in childbirth, unspecified control: Secondary | ICD-10-CM

## 2019-06-14 LAB — GLUCOSE, CAPILLARY
Glucose-Capillary: 63 mg/dL — ABNORMAL LOW (ref 70–99)
Glucose-Capillary: 68 mg/dL — ABNORMAL LOW (ref 70–99)
Glucose-Capillary: 73 mg/dL (ref 70–99)
Glucose-Capillary: 74 mg/dL (ref 70–99)
Glucose-Capillary: 81 mg/dL (ref 70–99)
Glucose-Capillary: 83 mg/dL (ref 70–99)

## 2019-06-14 LAB — RPR: RPR Ser Ql: NONREACTIVE

## 2019-06-14 MED ORDER — PHENYLEPHRINE 40 MCG/ML (10ML) SYRINGE FOR IV PUSH (FOR BLOOD PRESSURE SUPPORT)
80.0000 ug | PREFILLED_SYRINGE | INTRAVENOUS | Status: DC | PRN
  Filled 2019-06-14: qty 10

## 2019-06-14 MED ORDER — EPHEDRINE 5 MG/ML INJ: 10.0000 mg | INTRAVENOUS | Status: DC | PRN

## 2019-06-14 MED ORDER — LACTATED RINGERS AMNIOINFUSION
INTRAVENOUS | Status: DC
  Administered 2019-06-14: 12:00:00 400 mL via INTRAUTERINE

## 2019-06-14 MED ORDER — FENTANYL-BUPIVACAINE-NACL 0.5-0.125-0.9 MG/250ML-% EP SOLN: 12.0000 mL/h | EPIDURAL | Status: DC | PRN

## 2019-06-14 MED ORDER — PHENYLEPHRINE 40 MCG/ML (10ML) SYRINGE FOR IV PUSH (FOR BLOOD PRESSURE SUPPORT): 80.0000 ug | PREFILLED_SYRINGE | INTRAVENOUS | Status: DC | PRN

## 2019-06-14 MED ORDER — LACTATED RINGERS IV SOLN: 500.0000 mL | Freq: Once | INTRAVENOUS | Status: DC

## 2019-06-14 MED ORDER — FENTANYL-BUPIVACAINE-NACL 0.5-0.125-0.9 MG/250ML-% EP SOLN
12.0000 mL/h | EPIDURAL | Status: DC | PRN
  Administered 2019-06-14: 12 mL/h via EPIDURAL
  Filled 2019-06-14 (×2): qty 250

## 2019-06-14 MED ORDER — DIPHENHYDRAMINE HCL 50 MG/ML IJ SOLN: 12.5000 mg | INTRAMUSCULAR | Status: DC | PRN

## 2019-06-14 MED ORDER — SODIUM CHLORIDE (PF) 0.9 % IJ SOLN
INTRAMUSCULAR | Status: DC | PRN
  Administered 2019-06-14: 12 mL/h via EPIDURAL

## 2019-06-14 MED ORDER — LIDOCAINE HCL (PF) 1 % IJ SOLN
INTRAMUSCULAR | Status: DC | PRN
  Administered 2019-06-14 (×2): 5 mL via EPIDURAL

## 2019-06-14 NOTE — Progress Notes (Signed)
Patient Vitals for the past 4 hrs:  BP Temp Temp src Pulse Resp  06/14/19 1901 126/69 - - 97 18  06/14/19 1831 127/79 98.7 F (37.1 C) Oral (!) 167 18  06/14/19 1801 123/69 - - 94 18  06/14/19 1750 128/88 - - (!) 146 18  06/14/19 1731 116/85 - - 98 18  06/14/19 1701 118/70 - - (!) 138 18  06/14/19 1631 120/82 99.4 F (37.4 C) Axillary (!) 110 18  06/14/19 1601 122/78 - - (!) 104 18   Comfortable w/epidural.  Occ variable, last one d/t pt being flat on back, resolved w/high fowler's. Cx 9/100/0.  MVUs adequate.

## 2019-06-14 NOTE — Anesthesia Preprocedure Evaluation (Signed)
Anesthesia Evaluation  Patient identified by MRN, date of birth, ID band Patient awake    Reviewed: Allergy & Precautions, H&P , NPO status , Patient's Chart, lab work & pertinent test results  History of Anesthesia Complications Negative for: history of anesthetic complications  Airway Mallampati: II  TM Distance: >3 FB Neck ROM: full    Dental no notable dental hx. (+) Teeth Intact   Pulmonary neg pulmonary ROS, former smoker,    Pulmonary exam normal breath sounds clear to auscultation       Cardiovascular negative cardio ROS  Atrial Fibrillation  Rhythm:regular Rate:Normal     Neuro/Psych negative neurological ROS  negative psych ROS   GI/Hepatic negative GI ROS, Neg liver ROS,   Endo/Other  diabetes, Gestational  Renal/GU negative Renal ROS  negative genitourinary   Musculoskeletal   Abdominal   Peds  Hematology negative hematology ROS (+)   Anesthesia Other Findings   Reproductive/Obstetrics (+) Pregnancy                             Anesthesia Physical Anesthesia Plan  ASA: III  Anesthesia Plan: Epidural   Post-op Pain Management:    Induction:   PONV Risk Score and Plan:   Airway Management Planned:   Additional Equipment:   Intra-op Plan:   Post-operative Plan:   Informed Consent: I have reviewed the patients History and Physical, chart, labs and discussed the procedure including the risks, benefits and alternatives for the proposed anesthesia with the patient or authorized representative who has indicated his/her understanding and acceptance.       Plan Discussed with:   Anesthesia Plan Comments:         Anesthesia Quick Evaluation

## 2019-06-14 NOTE — Discharge Summary (Signed)
Postpartum Discharge Summary     Patient Name: Nichole Bowers DOB: [birth year] MRN: [id number]  Date of admission: 06/13/2019 Delivering Provider: [name]   Date of discharge: 06/16/2019  Admitting diagnosis: INDUCTION Intrauterine pregnancy: [redacted]w[redacted]d    Secondary diagnosis:  Active Problems:   Indication for care in labor or delivery  Additional problems: A1DM, prior CS, oligohydramnios     Discharge diagnosis: Term Pregnancy Delivered, VBAC and GDM A1                                                                                                Post partum procedures:none  Augmentation: AROM, Pitocin and Foley Balloon  Complications: None  Hospital course:  Induction of Labor With Vaginal Delivery   28y.o. yo G2P1001 at 365w2das admitted to the hospital 06/13/2019 for induction of labor.  Indication for induction: A1 DM.  Patient had an uncomplicated labor course as follows: received a foley and pitocin, amnioinfusion for variable decels; 20 minute 2nd stage.  Membrane Rupture Time/Date: 9:10 AM ,06/14/2019   Intrapartum Procedures: Episiotomy: None [1] none                                        Lacerations:  2nd degree [3];Vaginal [6]  Patient had delivery of a Viable infant.  Information for the patient's newborn:  AnZaina, Jenkin0[[id number]]    06/14/2019  Details of delivery can be found in separate delivery note.  Patient had a routine postpartum course.   Patient is discharged home 06/16/19.  She reports bleeding is the same and denies pain. She reports taking ibuprofen with good results and requests a prescription.  She states her Nexplanon was placed and denies issues with it.    Magnesium Sulfate recieved: No BMZ received: No  Physical exam  Vitals:   06/15/19 1003 06/15/19 1519 06/15/19 2115 06/16/19 0525  BP: 114/63 132/85 125/74 116/73  Pulse: 79 68 74 71  Resp:   18 18  Temp: 97.6 F (36.4 C) 97.8 F (36.6 C) 98.2 F (36.8  C) 98.3 F (36.8 C)  TempSrc: Oral Axillary Axillary Axillary  SpO2:    100%  Weight:      Height:       General: alert, cooperative and no distress  Chest: Lungs CTA, Heart RRR Abdomen: Soft, NonTender, NonDistended Left Arm Nexplanon Implant Palpated Lochia: appropriate Uterine Fundus: firm Incision: N/A DVT Evaluation: No significant calf/ankle edema. Labs: Lab Results  Component Value Date   WBC 8.0 06/13/2019   HGB 11.0 (L) 06/13/2019   HCT 33.6 (L) 06/13/2019   MCV 86.4 06/13/2019   PLT 252 06/13/2019   CMP Latest Ref Rng & Units 10/29/2018  Glucose 70 - 99 mg/dL 95  BUN 6 - 20 mg/dL 6  Creatinine 0.44 - 1.00 mg/dL 0.80  Sodium 135 - 145 mmol/L 136  Potassium 3.5 - 5.1 mmol/L 3.8  Chloride 98 - 111 mmol/L 103  CO2 22 - 32 mmol/L 25  Calcium  8.9 - 10.3 mg/dL 8.9  Total Protein 6.5 - 8.1 g/dL 7.7  Total Bilirubin 0.3 - 1.2 mg/dL 0.6  Alkaline Phos 38 - 126 U/L 34(L)  AST 15 - 41 U/L 18  ALT 0 - 44 U/L 15    Discharge instruction: per After Visit Summary and "Baby and Me Booklet". Pain Management, Peri-Care, Breastfeeding, Who and When to call for postpartum complications. Information Sheet(s) given Iron Rich Diet   After visit meds:  Allergies as of 06/16/2019      Reactions   Phenergan [promethazine] Nausea And Vomiting      Medication List    STOP taking these medications   Accu-Chek FastClix Lancets Misc   Accu-Chek Guide w/Device Kit   aspirin EC 81 MG tablet   glucose blood test strip Commonly known as: Accu-Chek Guide   Prenatal Vitamins 0.8 MG tablet     TAKE these medications   acetaminophen 325 MG tablet Commonly known as: TYLENOL Take 650 mg by mouth every 6 (six) hours as needed for mild pain or headache.   Comfort Fit Maternity Supp Lg Misc 1 each by Does not apply route daily as needed.   ferrous sulfate 325 (65 FE) MG tablet Take 1 tablet (325 mg total) by mouth daily with breakfast.   ibuprofen 600 MG tablet Commonly known  as: ADVIL Take 1 tablet (600 mg total) by mouth every 6 (six) hours.       Diet: routine diet  Activity: Advance as tolerated. Pelvic rest for 6 weeks.   Outpatient follow up:4 weeks Follow up Appt:No future appointments. Follow up Visit: [place] [facility] Follow up in 4 week(s).   Specialty: Obstetrics and Gynecology Contact information: [address] [place], [place] [id number] [place] [id number] [phone]           Please schedule this patient for Postpartum visit in: 4 weeks with the following provider: Any provider For C/S patients schedule nurse incision check in weeks 2 weeks: no High risk pregnancy complicated by: GDM Delivery mode:  VBAC Anticipated Birth Control:  Nexplanon (inpatient) PP Procedures needed: 2 hour GTT after 6 weeks Schedule Integrated [place] visit: no      Newborn Data: Live born female  Birth Weight:   APGAR: 38, 9  Newborn Delivery   Birth date/time: 06/14/2019 22:21:00 Delivery type: Vaginal, Spontaneous      Baby Feeding: Bottle Disposition:home with mother   06/16/2019 Maryann Conners, CNM

## 2019-06-14 NOTE — Progress Notes (Addendum)
Labor Progress Note Nichole Bowers is a 28 y.o. G2P1001 at [redacted]w[redacted]d presented for IOL for A1GDM, TOLAC  S:  Comfortable with epidural, states "tired of waiting".  O:  BP 122/78   Pulse (!) 104   Temp 98.6 F (37 C) (Oral)   Resp 18   Ht 5\' 3"  (1.6 m)   Wt 113.5 kg   LMP 09/19/2018 (Exact Date)   SpO2 97%   BMI 44.32 kg/m   CBG: 73 EFM: baseline 130 bpm/ mod variability/ + accels/ early, late, variable decels  Toco: 2-4 SVE: 6/75/-2 Pitocin: 6 mu/min MVU: 220  A/P: 28 y.o. G2P1001 [redacted]w[redacted]d  1. Labor: early active 2. FWB: Cat II 3. Pain: epidural 4. GDM- stable  Some cervical change since last exam. MVUs no consistently adequate. Improvement of tracing after amnioinfusion. Pt desires VBAC. Will continue Pitocin. Watch labor progress and FHT closely. Unclear MOD. Dr. Kennon Rounds updated with A/P.  Julianne Handler, CNM 4:16 PM

## 2019-06-14 NOTE — Progress Notes (Addendum)
Labor Progress Note Nichole Bowers is a 28 y.o. G2P1001 at [redacted]w[redacted]d presented for IOL for GDM, TOLAC  S:  Comfortable with epidural.  O:  BP 107/61   Pulse 81   Temp 98.6 F (37 C) (Oral)   Resp 15   Ht 5\' 3"  (1.6 m)   Wt 113.5 kg   LMP 09/19/2018 (Exact Date)   SpO2 97%   BMI 44.32 kg/m  EFM: baseline 145 bpm/ mod variability/ no accels/ variable decels  Toco: 3-4 SVE: 6/60/-2 Pitocin: 4 mu/min  A/P: 28 y.o. G2P1001 [redacted]w[redacted]d  1. Labor: latent 2. FWB: Cat II 3. Pain: epidural  Recurrent variables, IUPC placed for amnioinfusion. Maternal repositioning. Will watch closely. Uncertain MOD. Dr. Kennon Rounds updated with A/P.   Nichole Bowers, CNM 11:41 AM

## 2019-06-14 NOTE — Progress Notes (Signed)
Labor Progress Note Nichole Bowers is a 28 y.o. G2P1001 at [redacted]w[redacted]d presented for IOL for A1GDM, uncontrolled, TOLAC  S:  Comfortable with epidural, recent SROM.  O:  BP 117/65 (BP Location: Left Arm)   Pulse 96   Temp 98.6 F (37 C) (Oral)   Resp 16   Ht 5\' 3"  (1.6 m)   Wt 113.5 kg   LMP 09/19/2018 (Exact Date)   SpO2 97%   BMI 44.32 kg/m  EFM: baseline 130 bpm/ mod variability/ no accels/ early decels  Toco: 4-5 SVE: Dilation: 4 Effacement (%): 70 Station: -3, Ballotable Presentation: Vertex Exam by:: S Earl RNC Pitocin: off  A/P: 28 y.o. G2P1001 [redacted]w[redacted]d  1. Labor: latent 2. FWB: Cat II 3. Pain: epidural  Pitocin off for previous recurrent variables, FHT now with early decels, will restart Pitocin. Consider amnioinfusion if variables recur. Anticipate labor progress and VBAC.  Julianne Handler, CNM 10:45 AM

## 2019-06-14 NOTE — Anesthesia Procedure Notes (Signed)
Epidural Patient location during procedure: OB  Staffing Anesthesiologist: Montez Hageman, MD Performed: anesthesiologist   Preanesthetic Checklist Completed: patient identified, site marked, surgical consent, pre-op evaluation, timeout performed, IV checked, risks and benefits discussed and monitors and equipment checked  Epidural Patient position: sitting Prep: DuraPrep Patient monitoring: heart rate, continuous pulse ox and blood pressure Approach: midline Location: L3-L4 Injection technique: LOR saline  Needle:  Needle type: Tuohy  Needle gauge: 17 G Needle length: 9 cm and 9 Needle insertion depth: 7 cm Catheter type: closed end flexible Catheter size: 20 Guage Catheter at skin depth: 11 cm Test dose: negative  Assessment Events: blood not aspirated, injection not painful, no injection resistance, negative IV test and no paresthesia  Additional Notes Patient identified. Risks/Benefits/Options discussed with patient including but not limited to bleeding, infection, nerve damage, paralysis, failed block, incomplete pain control, headache, blood pressure changes, nausea, vomiting, reactions to medication both or allergic, itching and postpartum back pain. Confirmed with bedside nurse the patient's most recent platelet count. Confirmed with patient that they are not currently taking any anticoagulation, have any bleeding history or any family history of bleeding disorders. Patient expressed understanding and wished to proceed. All questions were answered. Sterile technique was used throughout the entire procedure. Please see nursing notes for vital signs. Test dose was given through epidural needle and negative prior to continuing to dose epidural or start infusion. Warning signs of high block given to the patient including shortness of breath, tingling/numbness in hands, complete motor block, or any concerning symptoms with instructions to call for help. Patient was given  instructions on fall risk and not to get out of bed. All questions and concerns addressed with instructions to call with any issues.

## 2019-06-14 NOTE — Progress Notes (Signed)
Patient ID: Nichole Bowers, female   DOB: 03/18/1991, 28 y.o.   MRN: [id number] Foley bulb inserted to aid in effacement of cervix  Vitals:   06/14/19 0401 06/14/19 0500 06/14/19 0534 06/14/19 0601  BP: (!) 113/53 114/75 108/66 106/61  Pulse: 78 73 78 69  Resp:  16    Temp:      TempSrc:      SpO2: 97%     Weight:      Height:       Dilation: 1 Effacement (%): 70 Station: -3 Presentation: Vertex Exam by:: Hansel Feinstein, CNM  Will anticipate increased dilation now

## 2019-06-15 DIAGNOSIS — O4103X0 Oligohydramnios, third trimester, not applicable or unspecified: Secondary | ICD-10-CM | POA: Diagnosis not present

## 2019-06-15 DIAGNOSIS — Z30017 Encounter for initial prescription of implantable subdermal contraceptive: Secondary | ICD-10-CM

## 2019-06-15 LAB — GLUCOSE, CAPILLARY: Glucose-Capillary: 71 mg/dL (ref 70–99)

## 2019-06-15 MED ORDER — DOCUSATE SODIUM 100 MG PO CAPS
100.0000 mg | ORAL_CAPSULE | Freq: Two times a day (BID) | ORAL | Status: DC
  Administered 2019-06-15 (×2): 100 mg via ORAL
  Filled 2019-06-15 (×2): qty 1

## 2019-06-15 MED ORDER — ETONOGESTREL 68 MG ~~LOC~~ IMPL
68.0000 mg | DRUG_IMPLANT | Freq: Once | SUBCUTANEOUS | Status: AC
  Administered 2019-06-15: 12:00:00 68 mg via SUBCUTANEOUS
  Filled 2019-06-15: qty 1

## 2019-06-15 MED ORDER — BISACODYL 10 MG RE SUPP: 10.0000 mg | Freq: Every day | RECTAL | Status: DC | PRN

## 2019-06-15 MED ORDER — ZOLPIDEM TARTRATE 5 MG PO TABS: 5.0000 mg | ORAL_TABLET | Freq: Every evening | ORAL | Status: DC | PRN

## 2019-06-15 MED ORDER — FLEET ENEMA 7-19 GM/118ML RE ENEM: 1.0000 | ENEMA | Freq: Every day | RECTAL | Status: DC | PRN

## 2019-06-15 MED ORDER — ONDANSETRON HCL 4 MG/2ML IJ SOLN: 4.0000 mg | INTRAMUSCULAR | Status: DC | PRN

## 2019-06-15 MED ORDER — DIPHENHYDRAMINE HCL 25 MG PO CAPS: 25.0000 mg | ORAL_CAPSULE | Freq: Four times a day (QID) | ORAL | Status: DC | PRN

## 2019-06-15 MED ORDER — ACETAMINOPHEN 325 MG PO TABS
650.0000 mg | ORAL_TABLET | ORAL | Status: DC | PRN
  Administered 2019-06-15: 08:00:00 650 mg via ORAL
  Filled 2019-06-15: qty 2

## 2019-06-15 MED ORDER — WITCH HAZEL-GLYCERIN EX PADS: 1.0000 "application " | MEDICATED_PAD | CUTANEOUS | Status: DC | PRN

## 2019-06-15 MED ORDER — LIDOCAINE HCL 1 % IJ SOLN
0.0000 mL | Freq: Once | INTRAMUSCULAR | Status: AC | PRN
  Administered 2019-06-15: 12:00:00 20 mL via INTRADERMAL
  Filled 2019-06-15: qty 20

## 2019-06-15 MED ORDER — COCONUT OIL OIL: 1.0000 "application " | TOPICAL_OIL | Status: DC | PRN

## 2019-06-15 MED ORDER — MEASLES, MUMPS & RUBELLA VAC IJ SOLR: 0.5000 mL | Freq: Once | INTRAMUSCULAR | Status: DC

## 2019-06-15 MED ORDER — METHYLERGONOVINE MALEATE 0.2 MG PO TABS: 0.2000 mg | ORAL_TABLET | ORAL | Status: DC | PRN

## 2019-06-15 MED ORDER — SIMETHICONE 80 MG PO CHEW
80.0000 mg | CHEWABLE_TABLET | ORAL | Status: DC | PRN
  Administered 2019-06-15: 08:00:00 80 mg via ORAL
  Filled 2019-06-15: qty 1

## 2019-06-15 MED ORDER — ONDANSETRON HCL 4 MG PO TABS: 4.0000 mg | ORAL_TABLET | ORAL | Status: DC | PRN

## 2019-06-15 MED ORDER — FERROUS SULFATE 325 (65 FE) MG PO TABS
325.0000 mg | ORAL_TABLET | Freq: Two times a day (BID) | ORAL | Status: DC
  Administered 2019-06-15 – 2019-06-16 (×3): 325 mg via ORAL
  Filled 2019-06-15 (×3): qty 1

## 2019-06-15 MED ORDER — TETANUS-DIPHTH-ACELL PERTUSSIS 5-2.5-18.5 LF-MCG/0.5 IM SUSP: 0.5000 mL | Freq: Once | INTRAMUSCULAR | Status: DC

## 2019-06-15 MED ORDER — BENZOCAINE-MENTHOL 20-0.5 % EX AERO
1.0000 "application " | INHALATION_SPRAY | CUTANEOUS | Status: DC | PRN
  Administered 2019-06-15: 1 via TOPICAL
  Filled 2019-06-15: qty 56

## 2019-06-15 MED ORDER — DIBUCAINE (PERIANAL) 1 % EX OINT: 1.0000 "application " | TOPICAL_OINTMENT | CUTANEOUS | Status: DC | PRN

## 2019-06-15 MED ORDER — IBUPROFEN 600 MG PO TABS
600.0000 mg | ORAL_TABLET | Freq: Four times a day (QID) | ORAL | Status: DC
  Administered 2019-06-15 – 2019-06-16 (×7): 600 mg via ORAL
  Filled 2019-06-15 (×6): qty 1

## 2019-06-15 MED ORDER — METHYLERGONOVINE MALEATE 0.2 MG/ML IJ SOLN: 0.2000 mg | INTRAMUSCULAR | Status: DC | PRN

## 2019-06-15 MED ORDER — PRENATAL MULTIVITAMIN CH
1.0000 | ORAL_TABLET | Freq: Every day | ORAL | Status: DC
  Administered 2019-06-15 – 2019-06-16 (×2): 1 via ORAL
  Filled 2019-06-15 (×2): qty 1

## 2019-06-15 NOTE — Anesthesia Postprocedure Evaluation (Signed)
Anesthesia Post Note  Patient: Nichole Bowers  Procedure(s) Performed: AN AD [place]     Patient location during evaluation: Mother Baby Anesthesia Type: Epidural Level of consciousness: awake Pain management: satisfactory to patient Vital Signs Assessment: post-procedure vital signs reviewed and stable Respiratory status: spontaneous breathing Cardiovascular status: stable Anesthetic complications: no    Last Vitals:  Vitals:   06/15/19 0125 06/15/19 0540  BP: 134/72 122/87  Pulse: 95 78  Resp: 18 18  Temp: 37.2 C 36.6 C  SpO2: 100% 100%    Last Pain:  Vitals:   06/15/19 0602  TempSrc:   PainSc: 6    Pain Goal: Patients Stated Pain Goal: 5 (06/14/19 1016)                 Casimer Lanius

## 2019-06-15 NOTE — Plan of Care (Signed)

## 2019-06-15 NOTE — Progress Notes (Signed)
Patient removed her IV. Site WNL.

## 2019-06-15 NOTE — Progress Notes (Signed)
Post Partum Day 1 Subjective: no complaints, up ad lib, voiding and tolerating PO, small lochia, plans to breastfeed, wants inpt Nexplanon  Objective: Blood pressure 122/87, pulse 78, temperature 97.8 F (36.6 C), temperature source Oral, resp. rate 18, height 5\' 3"  (1.6 m), weight 113.5 kg, last menstrual period 09/19/2018, SpO2 100 %, unknown if currently breastfeeding.  Physical Exam:  General: alert, cooperative and no distress Lochia:normal flow Chest: CTAB Heart: RRR no m/r/g Abdomen: +BS, soft, nontender,  Uterine Fundus: firm DVT Evaluation: No evidence of DVT seen on physical exam. Extremities: 1+ edema  Recent Labs    06/13/19 1851  HGB 11.0*  HCT 33.6*    Assessment/Plan: Plan for discharge tomorrow  Nexplanon today   LOS: 2 days   Nichole Bowers 06/15/2019, 7:51 AM

## 2019-06-15 NOTE — Lactation Note (Signed)
This note was copied from a baby's chart. Lactation Consultation Note  Patient Name: Nichole Bowers [name] Date: 06/15/2019    Spoke to RN Cephas Darby and she let LC know that mom has decided to formula feed for now, she has a Hx of marijuana consumption during the pregnancy but the UDS was negative. Let RN if mom changes her mind to notified lactation, [place] services no longer needed at this point.   Maternal Data    Feeding Feeding Type: Bottle Fed - Formula Nipple Type: Slow - flow  LATCH Score                   Interventions    Lactation Tools Discussed/Used     Consult Status      Tahirih Lair S Suda Forbess 06/15/2019, 1:42 PM

## 2019-06-15 NOTE — Procedures (Signed)
Nichole Bowers 28 y.o. PPD#1 from a VBAC delivery; desires in-hospital Nexplanon placement  Vitals:   06/15/19 0540 06/15/19 1003  BP: 122/87 114/63  Pulse: 78 79  Resp: 18   Temp: 97.8 F (36.6 C) 97.6 F (36.4 C)  SpO2: 100%     Past Medical History:  Diagnosis Date  . Atrial fibrillation ([place])   . Chlamydia 2019  . Cocaine use    Positive drug screen on 11/03/15  . Gestational diabetes   . Marijuana use    Positive drug screen on 11/03/15  . Sickle cell trait ([place])   . Urinary tract infection   . Ventral hernia without obstruction or gangrene     Family History  Problem Relation Age of Onset  . Healthy Mother   . Healthy Father   . Asthma Sister   . Diabetes Maternal Grandfather   . Diabetes Paternal Grandfather     Social History   Socioeconomic History  . Marital status: Single    Spouse name: Not on file  . Number of children: Not on file  . Years of education: Not on file  . Highest education level: Not on file  Occupational History  . Occupation: Biochemist, clinical  Social Needs  . Financial resource strain: Not on file  . Food insecurity    Worry: Never true    Inability: Never true  . Transportation needs    Medical: No    Non-medical: No  Tobacco Use  . Smoking status: Former Smoker    Types: Cigarettes    Quit date: 10/29/2018    Years since quitting: 0.6  . Smokeless tobacco: Never Used  Substance and Sexual Activity  . Alcohol use: Not Currently  . Drug use: Not Currently    Types: Cocaine, Marijuana    Comment: marijuana 1/30 ;cocaine use- has been years  . Sexual activity: Yes    Birth control/protection: None  Lifestyle  . Physical activity    Days per week: Not on file    Minutes per session: Not on file  . Stress: Not on file  Relationships  . Social Herbalist on phone: Not on file    Gets together: Not on file    Attends religious service: Not on file    Active member of club or organization: Not on file   Attends meetings of clubs or organizations: Not on file    Relationship status: Not on file  . Intimate partner violence    Fear of current or ex partner: No    Emotionally abused: No    Physically abused: No    Forced sexual activity: No  Other Topics Concern  . Not on file  Social History Narrative  . Not on file    HPI:  Nichole Bowers desires Nexplanon insertion.  She was given informed consent and a signed copy is in the chart.  Appropriate time out taken. Nexlanon site (R arm) identified and thea area was prepped in usual sterile fashon. 2 cc of 1% lidocaine was used to anesthetize the area and it was cleansed. The new Nexplanon was inserted without difficulty. A pressure bandage were applied.  Pt was instructed to remove pressure bandage in a few hours, and keep insertion site covered with a bandaid for 3 days.  Follow-up scheduled PRN problems.  Image of Nexplanon packaging under media tab. LOT# [id number]  Myrtis Ser, CNM 06/15/2019 2:16 PM

## 2019-06-15 NOTE — Clinical Social Work Maternal (Addendum)
CLINICAL SOCIAL WORK MATERNAL/CHILD NOTE  Patient Details  Name: Nichole Bowers MRN: [id number] Date of Birth: 10/05/1991  Date:  2019/08/17  Clinical Social Worker Initiating Note:  Elijio Miles Date/Time: Initiated:  06/15/19/0902     Child's Name:  Betsey Holiday   Biological Parents:  Mother, Father(Sheron Ehresman and Jeneen Rinks (last name unknown))   Need for Interpreter:  None   Reason for Referral:  Current Substance Use/Substance Use During Pregnancy    Address:  [address] [place] [address]    Phone number:  [phone] (home)     Additional phone number:   Household Members/Support Persons (HM/SP):   Household Member/Support Person 1, Household Member/Support Person 2, Household Member/Support Person 3, Household Member/Support Person 4   HM/SP Name Relationship DOB or Age  HM/SP -1 Regan Mcbryar Mother    HM/SP -2 Rosana Berger Daughter 06/25/2016  HM/SP -3   Sister    HM/SP -24   Sister    HM/SP -5        HM/SP -6        HM/SP -7        HM/SP -8          Natural Supports (not living in the home):  (MOB listed her mother and sisters as primary supports)   Chiropodist: None   Employment: Unemployed   Type of Work: Due to pandemic, MOB took herself out of work during pregnancy.   Education:  High school graduate   Homebound arranged:    Financial Resources:  Medicaid   Other Resources:  Physicist, medical , [facility]   Cultural/Religious Considerations Which May Impact Care:    Strengths:  Ability to meet basic needs , Home prepared for child , Pediatrician chosen   Psychotropic Medications:         Pediatrician:    [place]  Pediatrician List:   [facility] [place]      Pediatrician Fax Number:    Risk Factors/Current Problems:      Cognitive State:  Able to Concentrate , Alert , Linear Thinking     Mood/Affect:  Calm , Interested , Flat    CSW Assessment:  CSW received consult for THC use during pregnancy.  CSW met with MOB to offer support and complete assessment.    MOB sitting up in bed filling out paperwork with infant asleep in basinet, when CSW entered the room. MOB's mother also present but left prior to CSW completing assessment. CSW introduced self and explained reason for consult to which MOB expressed understanding. MOB presented with flat affect and little engagement but was attentive and appeared interested throughout assessment. Per MOB, she currently lives with her mom, sisters and daughter. MOB reported she is not currently employed as she took herself out of work because of the pandemic and being pregnant but stated she does received WIC and food stamps. MOB confirmed that she is aware of process to get infants added on to her plan. CSW inquired about MOB's mental health history and MOB denied having any but was receptive to PMAD education. CSW provided education regarding the baby blues period vs. perinatal mood disorders, discussed treatment and gave resources for mental health follow up if concerns arise.  CSW recommends self-evaluation during the postpartum time period using the New Mom Checklist from Postpartum Progress and encouraged MOB to contact  a medical professional if symptoms are noted at any time.    CSW inquired about MOB's substance use history and MOB openly shared that she used THC during her pregnancy to help with her appetite. MOB reported last use was 2-3 months ago. CSW informed MOB of Hospital Drug Policy and explained UDS came back negative but that CDS was still pending and that a CPS report would be made, if warranted. MOB denied any questions or concerns regarding policy or report.   MOB confirmed having all essential items for infant once discharged and reported infant would be sleeping in a basinet once home. CSW provided review of Sudden Infant  Death Syndrome (SIDS) precautions.    MOB denied any further questions, concerns or need for resources from CSW at this time. CSW will continue to follow CDS and make CPS report, if necessary.    CSW Plan/Description:  No Further Intervention Required/No Barriers to Discharge, Sudden Infant Death Syndrome (SIDS) Education, Perinatal Mood and Anxiety Disorder (PMADs) Education, [place], CSW Will Continue to Monitor Umbilical Cord Tissue Drug Screen Results and Make Report if Foye Spurling, [place] 04-07-19, 9:27 AM

## 2019-06-16 MED ORDER — FERROUS SULFATE 325 (65 FE) MG PO TABS: 325.0000 mg | ORAL_TABLET | Freq: Every day | ORAL | 3 refills | Status: DC

## 2019-06-16 MED ORDER — IBUPROFEN 600 MG PO TABS: 600.0000 mg | ORAL_TABLET | Freq: Four times a day (QID) | ORAL | 0 refills | Status: DC

## 2019-06-16 NOTE — Discharge Instructions (Signed)

## 2019-06-20 ENCOUNTER — Telehealth: Payer: Medicaid Other | Admitting: Obstetrics & Gynecology

## 2019-06-27 ENCOUNTER — Other Ambulatory Visit: Payer: Medicaid Other

## 2019-06-27 ENCOUNTER — Encounter: Payer: Medicaid Other | Admitting: Obstetrics & Gynecology

## 2019-07-04 ENCOUNTER — Encounter: Payer: Self-pay | Admitting: Advanced Practice Midwife

## 2019-07-04 NOTE — Progress Notes (Deleted)
  The note originally documented on this encounter has been moved the the encounter in which it belongs.  

## 2019-07-04 NOTE — Progress Notes (Signed)
Delivery note:  06/14/19:  Pt developed urge to push and quickly delivered, VBAC, at 2221. Weight 6# 13 oz, apgars 7/9.  After 1 minute, the cord was clamped and cut. 40 units of pitocin diluted in 1000cc LR was infused rapidly IV.  The placenta separated spontaneously and delivered via CCT and maternal pushing effort.  It was inspected and appears to be intact with a 3 VC.    Vagina was inspected and no lacerations were found.  Anesthesia;  Epidural EBL:  <500cc  [name] DNP, CNM

## 2019-07-11 ENCOUNTER — Encounter: Payer: Self-pay | Admitting: Nurse Practitioner

## 2019-07-11 ENCOUNTER — Telehealth (INDEPENDENT_AMBULATORY_CARE_PROVIDER_SITE_OTHER): Payer: Medicaid Other | Admitting: Nurse Practitioner

## 2019-07-11 ENCOUNTER — Other Ambulatory Visit: Payer: Self-pay

## 2019-07-11 DIAGNOSIS — O99210 Obesity complicating pregnancy, unspecified trimester: Secondary | ICD-10-CM

## 2019-07-11 DIAGNOSIS — O24410 Gestational diabetes mellitus in pregnancy, diet controlled: Secondary | ICD-10-CM

## 2019-07-11 DIAGNOSIS — Z1389 Encounter for screening for other disorder: Secondary | ICD-10-CM

## 2019-07-11 DIAGNOSIS — D573 Sickle-cell trait: Secondary | ICD-10-CM

## 2019-07-11 DIAGNOSIS — O99019 Anemia complicating pregnancy, unspecified trimester: Secondary | ICD-10-CM

## 2019-07-11 NOTE — Progress Notes (Signed)
Pt had nexplanon placed inpt.

## 2019-07-11 NOTE — Progress Notes (Signed)
I connected with@ on 07/11/19 at  2:35 PM EDT by: MyChart videl and verified that I am speaking with the correct person using two identifiers.  Patient is located at home and provider is located at [place] office.     The purpose of this virtual visit is to provide medical care while limiting exposure to the novel coronavirus. I discussed the limitations, risks, security and privacy concerns of performing an evaluation and management service by Earlie Server, NP and the availability of in person appointments. I also discussed with the patient that there may be a patient responsible charge related to this service. By engaging in this virtual visit, you consent to the provision of healthcare.  Additionally, you authorize for your insurance to be billed for the services provided during this visit.  The patient expressed understanding and agreed to proceed.  The following staff members participated in the virtual visit:  Carver Fila, Regino Ramirez and Earlie Server, NP  Post Partum Visit Note Subjective:    Ms. Nichole Bowers is a 28 y.o. [phone] female who presents for a postpartum visit. She is 4 weeks postpartum following a spontaneous vaginal delivery. I have fully reviewed the prenatal and intrapartum course. The delivery was at [redacted]w[redacted]d gestational weeks.She was induced for gestational diabetes - diet controlled, prior C/S, and oligohydramnious.  Outcome: spontaneous vaginal delivery. Anesthesia: none. Postpartum course has been good. Baby's course has been good. Baby is feeding by bottle - unknown. Bleeding no bleeding. Bowel function is normal. Bladder function is normal. Patient is not sexually active. Contraception method is Nexplanon from in hospital placement. Postpartum depression screening: negative.  The following portions of the patient's history were reviewed and updated as appropriate: allergies, current medications, past family history, past medical history, past social history, past surgical history and  problem list.  Review of Systems Pertinent items noted in HPI and remainder of comprehensive ROS otherwise negative.   Objective:   Vitals:   07/11/19 1454  BP: 118/86  Pulse: 79   Self-Obtained       Assessment:    Normal postpartum exam. Pap smear not done at today's visit. Last pap smear 12/2018 and results were normal. Next pap due 2023.   Plan:    1. Contraception: Nexplanon 2. No depression identified. 3. Follow up in: 2 weeks for 2 hour glucola or as needed.   Virginia Rochester, NP 07/11/2019 3:19 PM

## 2019-09-24 ENCOUNTER — Other Ambulatory Visit: Payer: Self-pay

## 2019-09-24 ENCOUNTER — Encounter (HOSPITAL_COMMUNITY): Payer: Self-pay | Admitting: Family Medicine

## 2019-09-24 ENCOUNTER — Ambulatory Visit (HOSPITAL_COMMUNITY)
Admission: EM | Admit: 2019-09-24 | Discharge: 2019-09-24 | Disposition: A | Discharge status: Home / Self Care | Payer: Medicaid Other | Attending: Family Medicine | Admitting: Family Medicine

## 2019-09-24 DIAGNOSIS — H1033 Unspecified acute conjunctivitis, bilateral: Secondary | ICD-10-CM | POA: Diagnosis not present

## 2019-09-24 MED ORDER — TOBRAMYCIN 0.3 % OP SOLN: 1.0000 [drp] | OPHTHALMIC | 1 refills | Status: DC

## 2019-09-24 NOTE — ED Triage Notes (Signed)
Patient presents to Urgent Care with complaints of dry and itchy eyes since 2 days ago. Patient reports she is worried she might have pink eye, does have two small children at home.

## 2019-09-24 NOTE — ED Provider Notes (Signed)
[facility]    CSN: [id number] Arrival date & time: 09/24/19  1407      History   Chief Complaint Chief Complaint  Patient presents with  . Eye Pain    HPI Nichole Bowers is a 28 y.o. female.   This is a 28 year old woman who presents with eye irritation.  She is an established [employer] urgent care patient. Patient presents to Urgent Care with complaints of dry and itchy eyes since 2 days ago. Patient reports she is worried she might have pink eye, does have two small children at home.   Eyes stuck shut this morning.     Past Medical History:  Diagnosis Date  . Atrial fibrillation ([place])   . Chlamydia 2019  . Cocaine use    Positive drug screen on 11/03/15  . Gestational diabetes   . Marijuana use    Positive drug screen on 11/03/15  . Sickle cell trait ([place])   . Urinary tract infection   . Ventral hernia without obstruction or gangrene     Patient Active Problem List   Diagnosis Date Noted  . Sickle cell trait ([place]) 12/28/2018  . Ventral hernia without obstruction or gangrene 05/05/2016  . Marijuana use 11/08/2015    Past Surgical History:  Procedure Laterality Date  . CESAREAN SECTION N/A 06/25/2016   Procedure: CESAREAN SECTION;  Surgeon: Osborne Oman, MD;  Location: [facility];  Service: Obstetrics;  Laterality: N/A;  . HERNIA REPAIR  2017  . WISDOM TOOTH EXTRACTION      OB History    Gravida  2   Para  2   Term  2   Preterm  0   AB  0   Living  2     SAB  0   TAB  0   Ectopic  0   Multiple  0   Live Births  2            Home Medications    Prior to Admission medications   Medication Sig Start Date End Date Taking? Authorizing Provider  amoxicillin (AMOXIL) 500 MG capsule Take 500 mg by mouth every 8 (eight) hours. 09/18/19  Yes [provider]  acetaminophen (TYLENOL) 325 MG tablet Take 650 mg by mouth every 6 (six) hours as needed for mild pain or headache.    [provider]   Elastic Bandages & Supports (COMFORT FIT MATERNITY SUPP LG) MISC 1 each by Does not apply route daily as needed. 02/08/19   Rasch, Anderson Malta I, NP  ferrous sulfate 325 (65 FE) MG tablet Take 1 tablet (325 mg total) by mouth daily with breakfast. 06/16/19   Gavin Pound, CNM  Prenatal Vit-Fe Fumarate-FA (PRENATAL MULTIVITAMIN) TABS tablet Take 1 tablet by mouth daily at 12 noon.    [provider]  tobramycin (TOBREX) 0.3 % ophthalmic solution Place 1 drop into both eyes every 4 (four) hours. 09/24/19   Robyn Haber, MD    Family History Family History  Problem Relation Age of Onset  . Healthy Mother   . Healthy Father   . Asthma Sister   . Diabetes Maternal Grandfather   . Diabetes Paternal Grandfather     Social History Social History   Tobacco Use  . Smoking status: Current Every Day Smoker    Packs/day: 0.20    Types: Cigarettes    Last attempt to quit: 10/29/2018    Years since quitting: 0.9  . Smokeless tobacco: Never Used  . Tobacco  comment: 1-2 cigarettes per day  Substance Use Topics  . Alcohol use: Yes    Comment: weekends  . Drug use: Not Currently    Types: Cocaine, Marijuana     Allergies   Phenergan [promethazine]   Review of Systems Review of Systems   Physical Exam Triage Vital Signs ED Triage Vitals  Enc Vitals Group     BP      Pulse      Resp      Temp      Temp src      SpO2      Weight      Height      Head Circumference      Peak Flow      Pain Score      Pain Loc      Pain Edu?      Excl. in [place]?    No data found.  Updated Vital Signs BP 129/87 (BP Location: Right Arm)   Pulse 69   Temp (!) 97.4 F (36.3 C) (Temporal)   Resp 18   SpO2 100%   Visual Acuity Right Eye Distance:   Left Eye Distance:   Bilateral Distance:    Right Eye Near:   Left Eye Near:    Bilateral Near:     Physical Exam Vitals signs and nursing note reviewed.  Constitutional:      Appearance: Normal appearance. She is obese.  Eyes:      Comments: Injected conjunctiva bilaterally  Neck:     Musculoskeletal: Normal range of motion and neck supple.  Pulmonary:     Effort: Pulmonary effort is normal.  Musculoskeletal: Normal range of motion.  Skin:    General: Skin is warm and dry.  Neurological:     General: No focal deficit present.     Mental Status: She is alert.  Psychiatric:        Mood and Affect: Mood normal.      UC Treatments / Results  Labs (all labs ordered are listed, but only abnormal results are displayed) Labs Reviewed - No data to display  EKG   Radiology No results found.  Procedures Procedures (including critical care time)  Medications Ordered in UC Medications - No data to display  Initial Impression / Assessment and Plan / UC Course  I have reviewed the triage vital signs and the nursing notes.  Pertinent labs & imaging results that were available during my care of the patient were reviewed by me and considered in my medical decision making (see chart for details).    Final Clinical Impressions(s) / UC Diagnoses   Final diagnoses:  Acute bacterial conjunctivitis of both eyes   Discharge Instructions   None    ED Prescriptions    Medication Sig Dispense Auth. Provider   tobramycin (TOBREX) 0.3 % ophthalmic solution Place 1 drop into both eyes every 4 (four) hours. 5 mL Robyn Haber, MD     I have reviewed the PDMP during this encounter.   Robyn Haber, MD 09/24/19 1525

## 2019-11-01 ENCOUNTER — Ambulatory Visit: Payer: Medicaid Other | Admitting: Obstetrics and Gynecology

## 2019-12-02 ENCOUNTER — Encounter (HOSPITAL_COMMUNITY): Payer: Self-pay

## 2019-12-02 ENCOUNTER — Other Ambulatory Visit: Payer: Self-pay

## 2019-12-02 ENCOUNTER — Ambulatory Visit (HOSPITAL_COMMUNITY)
Admission: EM | Admit: 2019-12-02 | Discharge: 2019-12-02 | Disposition: A | Discharge status: Home / Self Care | Payer: Medicaid Other | Attending: Family Medicine | Admitting: Family Medicine

## 2019-12-02 DIAGNOSIS — R590 Localized enlarged lymph nodes: Secondary | ICD-10-CM | POA: Diagnosis not present

## 2019-12-02 DIAGNOSIS — Z20822 Contact with and (suspected) exposure to covid-19: Secondary | ICD-10-CM | POA: Diagnosis not present

## 2019-12-02 DIAGNOSIS — I4891 Unspecified atrial fibrillation: Secondary | ICD-10-CM | POA: Insufficient documentation

## 2019-12-02 DIAGNOSIS — Z79899 Other long term (current) drug therapy: Secondary | ICD-10-CM | POA: Diagnosis not present

## 2019-12-02 DIAGNOSIS — J029 Acute pharyngitis, unspecified: Secondary | ICD-10-CM

## 2019-12-02 DIAGNOSIS — J020 Streptococcal pharyngitis: Secondary | ICD-10-CM

## 2019-12-02 DIAGNOSIS — D573 Sickle-cell trait: Secondary | ICD-10-CM | POA: Insufficient documentation

## 2019-12-02 DIAGNOSIS — R070 Pain in throat: Secondary | ICD-10-CM

## 2019-12-02 DIAGNOSIS — R599 Enlarged lymph nodes, unspecified: Secondary | ICD-10-CM | POA: Diagnosis not present

## 2019-12-02 DIAGNOSIS — F17210 Nicotine dependence, cigarettes, uncomplicated: Secondary | ICD-10-CM | POA: Insufficient documentation

## 2019-12-02 LAB — POC SARS CORONAVIRUS 2 AG -  ED: SARS Coronavirus 2 Ag: NEGATIVE

## 2019-12-02 LAB — POCT RAPID STREP A: Streptococcus, Group A Screen (Direct): POSITIVE — AB

## 2019-12-02 LAB — POC SARS CORONAVIRUS 2 AG: SARS Coronavirus 2 Ag: NEGATIVE

## 2019-12-02 MED ORDER — AMOXICILLIN 500 MG PO CAPS: 500.0000 mg | ORAL_CAPSULE | Freq: Two times a day (BID) | ORAL | 0 refills | Status: AC

## 2019-12-02 MED ORDER — LIDOCAINE VISCOUS HCL 2 % MT SOLN: 15.0000 mL | Freq: Four times a day (QID) | OROMUCOSAL | 0 refills | Status: DC | PRN

## 2019-12-02 NOTE — ED Triage Notes (Signed)
Pt states she has a sore throat. Pt states she thinks it's strep throat. This started last night.

## 2019-12-02 NOTE — ED Provider Notes (Signed)
[facility]    CSN: [id number] Arrival date & time: 12/02/19  1622      History   Chief Complaint Chief Complaint  Patient presents with  . Sore Throat    HPI Nichole Bowers is a 29 y.o. female.   HPI  Nichole Bowers presents with a concern for worsening soreness of throat. Reports noticing white patches on the back of the throat this morning. Previously diagnosed with strep in the past. No known sick contacts. She notes recent dental work approximately 2-3 weeks ago having 3 teeth pulled. No known COVID-19 exposure. Endorses enlarged lymph nodes, pain with swallowing. No N&V or abdominal pain. Denies URI symptoms.  Past Medical History:  Diagnosis Date  . Atrial fibrillation ([place])   . Chlamydia 2019  . Cocaine use    Positive drug screen on 11/03/15  . Gestational diabetes   . Marijuana use    Positive drug screen on 11/03/15  . Sickle cell trait ([place])   . Urinary tract infection   . Ventral hernia without obstruction or gangrene     Patient Active Problem List   Diagnosis Date Noted  . Sickle cell trait ([place]) 12/28/2018  . Ventral hernia without obstruction or gangrene 05/05/2016  . Marijuana use 11/08/2015    Past Surgical History:  Procedure Laterality Date  . CESAREAN SECTION N/A 06/25/2016   Procedure: CESAREAN SECTION;  Surgeon: Osborne Oman, MD;  Location: [facility];  Service: Obstetrics;  Laterality: N/A;  . HERNIA REPAIR  2017  . WISDOM TOOTH EXTRACTION      OB History    Gravida  2   Para  2   Term  2   Preterm  0   AB  0   Living  2     SAB  0   TAB  0   Ectopic  0   Multiple  0   Live Births  2            Home Medications    Prior to Admission medications   Medication Sig Start Date End Date Taking? Authorizing Provider  acetaminophen (TYLENOL) 325 MG tablet Take 650 mg by mouth every 6 (six) hours as needed for mild pain or headache.    [provider]  amoxicillin (AMOXIL) 500 MG  capsule Take 1 capsule (500 mg total) by mouth 2 (two) times daily for 10 days. 12/02/19 12/12/19  Scot Jun, FNP  Elastic Bandages & Supports (COMFORT FIT MATERNITY SUPP LG) MISC 1 each by Does not apply route daily as needed. 02/08/19   Rasch, Anderson Malta I, NP  ferrous sulfate 325 (65 FE) MG tablet Take 1 tablet (325 mg total) by mouth daily with breakfast. 06/16/19   Gavin Pound, CNM  lidocaine (XYLOCAINE) 2 % solution Use as directed 15 mLs in the mouth or throat every 6 (six) hours as needed for mouth pain. 12/02/19   Scot Jun, FNP  Prenatal Vit-Fe Fumarate-FA (PRENATAL MULTIVITAMIN) TABS tablet Take 1 tablet by mouth daily at 12 noon.    [provider]  tobramycin (TOBREX) 0.3 % ophthalmic solution Place 1 drop into both eyes every 4 (four) hours. 09/24/19   Robyn Haber, MD    Family History Family History  Problem Relation Age of Onset  . Healthy Mother   . Healthy Father   . Asthma Sister   . Diabetes Maternal Grandfather   . Diabetes Paternal Grandfather     Social History Social History  Tobacco Use  . Smoking status: Current Every Day Smoker    Packs/day: 0.20    Types: Cigarettes    Last attempt to quit: 10/29/2018    Years since quitting: 1.0  . Smokeless tobacco: Never Used  . Tobacco comment: 1-2 cigarettes per day  Substance Use Topics  . Alcohol use: Yes    Comment: weekends  . Drug use: Not Currently    Types: Cocaine, Marijuana     Allergies   Phenergan [promethazine]   Review of Systems Review of Systems Pertinent negatives listed in HPI Physical Exam Triage Vital Signs ED Triage Vitals  Enc Vitals Group     BP 12/02/19 1758 120/73     Pulse Rate 12/02/19 1758 71     Resp 12/02/19 1758 16     Temp 12/02/19 1758 99.3 F (37.4 C)     Temp Source 12/02/19 1758 Oral     SpO2 12/02/19 1758 100 %     Weight 12/02/19 1803 185 lb (83.9 kg)     Height --      Head Circumference --      Peak Flow --      Pain Score  12/02/19 1803 9     Pain Loc --      Pain Edu? --      Excl. in [place]? --    No data found.  Updated Vital Signs BP 120/73 (BP Location: Left Arm)   Pulse 71   Temp 99.3 F (37.4 C) (Oral)   Resp 16   Wt 185 lb (83.9 kg)   LMP 12/02/2019   SpO2 100%   BMI 32.77 kg/m   Visual Acuity Right Eye Distance:   Left Eye Distance:   Bilateral Distance:    Right Eye Near:   Left Eye Near:    Bilateral Near:     Physical Exam Constitutional:      Appearance: She is well-developed.  HENT:     Mouth/Throat:     Pharynx: Uvula midline. Pharyngeal swelling, oropharyngeal exudate, posterior oropharyngeal erythema and uvula swelling present.     Tonsils: Tonsillar abscess present. 3+ on the right. 3+ on the left.  Cardiovascular:     Rate and Rhythm: Normal rate and regular rhythm.  Pulmonary:     Effort: Pulmonary effort is normal.  Lymphadenopathy:     Cervical: Cervical adenopathy present.  Neurological:     Mental Status: She is alert.      UC Treatments / Results  Labs (all labs ordered are listed, but only abnormal results are displayed) Labs Reviewed  POCT RAPID STREP A - Abnormal; Notable for the following components:      Result Value   Streptococcus, Group A Screen (Direct) POSITIVE (*)    All other components within normal limits  NOVEL CORONAVIRUS, NAA (HOSP ORDER, SEND-OUT TO REF LAB; TAT 18-24 HRS)  POC SARS CORONAVIRUS 2 AG -  ED  POC SARS CORONAVIRUS 2 AG    EKG   Radiology No results found.  Procedures Procedures (including critical care time)  Medications Ordered in UC Medications - No data to display  Initial Impression / Assessment and Plan / UC Course  I have reviewed the triage vital signs and the nursing notes.  Pertinent labs & imaging results that were available during my care of the patient were reviewed by me and considered in my medical decision making (see chart for details).   Acute pharyngitis related to streptococcal infection.   Treat empirically with  amoxicillin 500 mg twice daily x10 days.  For pain patient prescribed lidocaine viscous every 6 hours as needed for pain.  Patient advised to follow-up if symptoms do not improve or resolve completely.  Patient has COVID-19 test sent out pending.  Point-of-care COVID-19 was negative.  Patient verbalized understanding and agreement with plan today.  Final Clinical Impressions(s) / UC Diagnoses   Final diagnoses:  Strep pharyngitis   Discharge Instructions   None    ED Prescriptions    Medication Sig Dispense Auth. Provider   amoxicillin (AMOXIL) 500 MG capsule Take 1 capsule (500 mg total) by mouth 2 (two) times daily for 10 days. 20 capsule Scot Jun, FNP   lidocaine (XYLOCAINE) 2 % solution Use as directed 15 mLs in the mouth or throat every 6 (six) hours as needed for mouth pain. 100 mL Scot Jun, FNP     PDMP not reviewed this encounter.   Scot Jun, FNP 12/02/19 2001

## 2019-12-05 LAB — NOVEL CORONAVIRUS, NAA (HOSP ORDER, SEND-OUT TO REF LAB; TAT 18-24 HRS): SARS-CoV-2, NAA: NOT DETECTED

## 2020-01-20 ENCOUNTER — Encounter (HOSPITAL_COMMUNITY): Payer: Self-pay

## 2020-01-20 ENCOUNTER — Ambulatory Visit (HOSPITAL_COMMUNITY)
Admission: EM | Admit: 2020-01-20 | Discharge: 2020-01-20 | Disposition: A | Discharge status: Home / Self Care | Payer: Medicaid Other | Attending: Family Medicine | Admitting: Family Medicine

## 2020-01-20 ENCOUNTER — Other Ambulatory Visit: Payer: Self-pay

## 2020-01-20 DIAGNOSIS — S61412A Laceration without foreign body of left hand, initial encounter: Secondary | ICD-10-CM

## 2020-01-20 DIAGNOSIS — M79642 Pain in left hand: Secondary | ICD-10-CM

## 2020-01-20 MED ORDER — DOXYCYCLINE HYCLATE 100 MG PO CAPS: 100.0000 mg | ORAL_CAPSULE | Freq: Two times a day (BID) | ORAL | 0 refills | Status: DC

## 2020-01-20 NOTE — ED Provider Notes (Signed)
[facility]    CSN: [id number] Arrival date & time: 01/20/20  1232      History   Chief Complaint Chief Complaint  Patient presents with  . Laceration    HPI Nichole Bowers is a 29 y.o. female.   Reports that she closed the trunk on a car and cut the outer side of her left hand on broken intent on the trunk.  Reports that this occurred last night.  Reports pain and tenderness to the area.  Denies that there was ever a foreign body in the laceration.  Denies fever, headache, sore throat, cough, nausea, vomiting, chills, diarrhea, myalgia, fatigue, rash, other symptoms.  ROS Per HPI  The history is provided by the patient.  Laceration   Past Medical History:  Diagnosis Date  . Atrial fibrillation ([place])   . Chlamydia 2019  . Cocaine use    Positive drug screen on 11/03/15  . Gestational diabetes   . Marijuana use    Positive drug screen on 11/03/15  . Sickle cell trait ([place])   . Urinary tract infection   . Ventral hernia without obstruction or gangrene     Patient Active Problem List   Diagnosis Date Noted  . Sickle cell trait ([place]) 12/28/2018  . Ventral hernia without obstruction or gangrene 05/05/2016  . Marijuana use 11/08/2015    Past Surgical History:  Procedure Laterality Date  . CESAREAN SECTION N/A 06/25/2016   Procedure: CESAREAN SECTION;  Surgeon: Osborne Oman, MD;  Location: [facility];  Service: Obstetrics;  Laterality: N/A;  . HERNIA REPAIR  2017  . WISDOM TOOTH EXTRACTION      OB History    Gravida  2   Para  2   Term  2   Preterm  0   AB  0   Living  2     SAB  0   TAB  0   Ectopic  0   Multiple  0   Live Births  2            Home Medications    Prior to Admission medications   Medication Sig Start Date End Date Taking? Authorizing Provider  acetaminophen (TYLENOL) 325 MG tablet Take 650 mg by mouth every 6 (six) hours as needed for mild pain or headache.    [provider]    doxycycline (VIBRAMYCIN) 100 MG capsule Take 1 capsule (100 mg total) by mouth 2 (two) times daily. 01/20/20   Faustino Congress, NP  Elastic Bandages & Supports (COMFORT FIT MATERNITY SUPP LG) MISC 1 each by Does not apply route daily as needed. 02/08/19   Rasch, Anderson Malta I, NP  ferrous sulfate 325 (65 FE) MG tablet Take 1 tablet (325 mg total) by mouth daily with breakfast. 06/16/19   Gavin Pound, CNM  lidocaine (XYLOCAINE) 2 % solution Use as directed 15 mLs in the mouth or throat every 6 (six) hours as needed for mouth pain. 12/02/19   Scot Jun, FNP  Prenatal Vit-Fe Fumarate-FA (PRENATAL MULTIVITAMIN) TABS tablet Take 1 tablet by mouth daily at 12 noon.    [provider]  tobramycin (TOBREX) 0.3 % ophthalmic solution Place 1 drop into both eyes every 4 (four) hours. 09/24/19   Robyn Haber, MD    Family History Family History  Problem Relation Age of Onset  . Healthy Mother   . Healthy Father   . Asthma Sister   . Diabetes Maternal Grandfather   . Diabetes Paternal Grandfather  Social History Social History   Tobacco Use  . Smoking status: Current Every Day Smoker    Packs/day: 0.20    Types: Cigarettes    Last attempt to quit: 10/29/2018    Years since quitting: 1.2  . Smokeless tobacco: Never Used  . Tobacco comment: 1-2 cigarettes per day  Substance Use Topics  . Alcohol use: Yes    Comment: weekends  . Drug use: Not Currently    Types: Cocaine, Marijuana     Allergies   Phenergan [promethazine]   Review of Systems Review of Systems   Physical Exam Triage Vital Signs ED Triage Vitals  Enc Vitals Group     BP 01/20/20 1301 133/88     Pulse Rate 01/20/20 1301 88     Resp 01/20/20 1301 18     Temp 01/20/20 1301 98.5 F (36.9 C)     Temp Source 01/20/20 1301 Oral     SpO2 01/20/20 1301 98 %     Weight --      Height --      Head Circumference --      Peak Flow --      Pain Score 01/20/20 1300 4     Pain Loc --      Pain Edu?  --      Excl. in [place]? --    No data found.  Updated Vital Signs BP 133/88 (BP Location: Right Arm)   Pulse 88   Temp 98.5 F (36.9 C) (Oral)   Resp 18   SpO2 98%       Physical Exam Vitals and nursing note reviewed.  Constitutional:      General: She is not in acute distress.    Appearance: She is well-developed.  HENT:     Head: Normocephalic and atraumatic.  Eyes:     Conjunctiva/sclera: Conjunctivae normal.  Cardiovascular:     Rate and Rhythm: Normal rate and regular rhythm.     Heart sounds: No murmur.  Pulmonary:     Effort: Pulmonary effort is normal. No respiratory distress.     Breath sounds: Normal breath sounds. No stridor. No wheezing, rhonchi or rales.  Abdominal:     General: Bowel sounds are normal.     Palpations: Abdomen is soft.     Tenderness: There is no abdominal tenderness.  Musculoskeletal:     Left hand: Laceration present.       Arms:     Cervical back: Neck supple.     Comments: Laceration about 2 cm long, with abrasion and another 2 cm long above.  Skin:    General: Skin is warm and dry.  Neurological:     General: No focal deficit present.     Mental Status: She is alert and oriented to person, place, and time.  Psychiatric:        Mood and Affect: Mood normal.        Behavior: Behavior normal.      UC Treatments / Results  Labs (all labs ordered are listed, but only abnormal results are displayed) Labs Reviewed - No data to display  EKG   Radiology No results found.  Procedures Laceration Repair  Date/Time: 01/20/2020 2:18 PM Performed by: Faustino Congress, NP Authorized by: Faustino Congress, NP   Consent:    Consent obtained:  Verbal   Consent given by:  Patient   Risks discussed:  Infection and pain Anesthesia (see MAR for exact dosages):    Anesthesia method:  None Laceration  details:    Location:  Hand   Hand location:  L hand, dorsum   Length (cm):  4   Depth (mm):  2 Repair type:    Repair type:   Simple Exploration:    Contaminated: no   Treatment:    Area cleansed with:  Shur-Clens   Amount of cleaning:  Standard Skin repair:    Repair method:  Steri-Strips and tissue adhesive   Number of Steri-Strips:  4 Approximation:    Approximation:  Close Post-procedure details:    Dressing:  Open (no dressing)   Patient tolerance of procedure:  Tolerated well, no immediate complications   (including critical care time)  Medications Ordered in UC Medications - No data to display  Initial Impression / Assessment and Plan / UC Course  I have reviewed the triage vital signs and the nursing notes.  Pertinent labs & imaging results that were available during my care of the patient were reviewed by me and considered in my medical decision making (see chart for details).     Presents with laceration to the outer left portion of her hand measuring about 4 cm long, about 2 cm of the laceration was open.  No erythema or foreign body noted.  Closed wound with Dermabond and Steri-Strips.  Patient tolerated well.  Prescribe doxycycline 100 mg twice daily x7 days.  Instructed to follow-up with primary care or here if needed.  Instructed on when to go to the ER. Final Clinical Impressions(s) / UC Diagnoses   Final diagnoses:  Laceration of left hand without foreign body, initial encounter  Hand pain, left     Discharge Instructions     Keep your wound clean and dry.  I have applied skin glue and steri strips. These will fall off on their own as your skin heals.   Follow up with primary care or with this office if needed.    ED Prescriptions    Medication Sig Dispense Auth. Provider   doxycycline (VIBRAMYCIN) 100 MG capsule Take 1 capsule (100 mg total) by mouth 2 (two) times daily. 14 capsule Faustino Congress, NP     I have reviewed the PDMP during this encounter.   Faustino Congress, NP 01/20/20 1420

## 2020-01-20 NOTE — Discharge Instructions (Signed)
Keep your wound clean and dry.  I have applied skin glue and steri strips. These will fall off on their own as your skin heals.   Follow up with primary care or with this office if needed.

## 2020-01-20 NOTE — ED Triage Notes (Signed)
Pt presents with laceration on outside of left hand from jagged piece on trunk of car last night.

## 2020-08-19 ENCOUNTER — Ambulatory Visit (HOSPITAL_COMMUNITY)
Admission: EM | Admit: 2020-08-19 | Discharge: 2020-08-19 | Disposition: A | Discharge status: Home / Self Care | Payer: Medicaid Other | Attending: Physician Assistant | Admitting: Physician Assistant

## 2020-08-19 ENCOUNTER — Other Ambulatory Visit: Payer: Self-pay

## 2020-08-19 ENCOUNTER — Encounter (HOSPITAL_COMMUNITY): Payer: Self-pay | Admitting: Emergency Medicine

## 2020-08-19 DIAGNOSIS — R112 Nausea with vomiting, unspecified: Secondary | ICD-10-CM | POA: Insufficient documentation

## 2020-08-19 DIAGNOSIS — R1013 Epigastric pain: Secondary | ICD-10-CM | POA: Insufficient documentation

## 2020-08-19 DIAGNOSIS — R1084 Generalized abdominal pain: Secondary | ICD-10-CM | POA: Diagnosis not present

## 2020-08-19 LAB — COMPREHENSIVE METABOLIC PANEL
ALT: 20 U/L (ref 0–44)
AST: 19 U/L (ref 15–41)
Albumin: 4 g/dL (ref 3.5–5.0)
Alkaline Phosphatase: 55 U/L (ref 38–126)
Anion gap: 9 (ref 5–15)
BUN: 6 mg/dL (ref 6–20)
CO2: 24 mmol/L (ref 22–32)
Calcium: 9.2 mg/dL (ref 8.9–10.3)
Chloride: 101 mmol/L (ref 98–111)
Creatinine, Ser: 0.81 mg/dL (ref 0.44–1.00)
GFR calc Af Amer: >60 mL/min (ref >60)
GFR calc non Af Amer: >60 mL/min (ref >60)
Glucose, Bld: 98 mg/dL (ref 70–99)
Potassium: 3.7 mmol/L (ref 3.5–5.1)
Sodium: 134 mmol/L — ABNORMAL LOW (ref 135–145)
Total Bilirubin: 0.6 mg/dL (ref 0.3–1.2)
Total Protein: 7.9 g/dL (ref 6.5–8.1)

## 2020-08-19 LAB — CBC
HCT: 42.4 % (ref 36.0–46.0)
Hemoglobin: 13.7 g/dL (ref 12.0–15.0)
MCH: 27.7 pg (ref 26.0–34.0)
MCHC: 32.3 g/dL (ref 30.0–36.0)
MCV: 85.8 fL (ref 80.0–100.0)
Platelets: 279 10*3/uL (ref 150–400)
RBC: 4.94 MIL/uL (ref 3.87–5.11)
RDW: 14.1 % (ref 11.5–15.5)
WBC: 9.8 10*3/uL (ref 4.0–10.5)
nRBC: 0 % (ref 0.0–0.2)

## 2020-08-19 LAB — LIPASE, BLOOD: Lipase: 27 U/L (ref 11–51)

## 2020-08-19 MED ORDER — ONDANSETRON 4 MG PO TBDP
4.0000 mg | ORAL_TABLET | Freq: Once | ORAL | Status: AC
  Administered 2020-08-19: 4 mg via ORAL

## 2020-08-19 MED ORDER — ALUM & MAG HYDROXIDE-SIMETH 200-200-20 MG/5ML PO SUSP
ORAL | Status: AC
  Filled 2020-08-19: qty 30

## 2020-08-19 MED ORDER — FAMOTIDINE 20 MG PO TABS: 20.0000 mg | ORAL_TABLET | Freq: Two times a day (BID) | ORAL | 0 refills | Status: DC

## 2020-08-19 MED ORDER — LIDOCAINE VISCOUS HCL 2 % MT SOLN
OROMUCOSAL | Status: AC
  Filled 2020-08-19: qty 15

## 2020-08-19 MED ORDER — ONDANSETRON 4 MG PO TBDP
ORAL_TABLET | ORAL | Status: AC
  Filled 2020-08-19: qty 1

## 2020-08-19 MED ORDER — OMEPRAZOLE 20 MG PO CPDR: 20.0000 mg | DELAYED_RELEASE_CAPSULE | Freq: Every day | ORAL | 0 refills | Status: AC

## 2020-08-19 MED ORDER — ONDANSETRON HCL 4 MG PO TABS: 4.0000 mg | ORAL_TABLET | Freq: Three times a day (TID) | ORAL | 0 refills | Status: DC | PRN

## 2020-08-19 MED ORDER — LIDOCAINE VISCOUS HCL 2 % MT SOLN
15.0000 mL | Freq: Once | OROMUCOSAL | Status: AC
  Administered 2020-08-19: 15 mL via ORAL

## 2020-08-19 MED ORDER — ALUM & MAG HYDROXIDE-SIMETH 200-200-20 MG/5ML PO SUSP
30.0000 mL | Freq: Once | ORAL | Status: AC
  Administered 2020-08-19: 30 mL via ORAL

## 2020-08-19 NOTE — ED Triage Notes (Signed)
Patient presents to [facility] for assessment of epigastric abdominal pain starting 3-4 days ago, hx of hernia repair to same area.  C/o nausea and 3 episodes of emesis in the last 24 hours.  Patient c/o increased pain with palpation.  Denies constipation, c/o looser than normal stools.

## 2020-08-19 NOTE — Discharge Instructions (Addendum)
I have sent basic labs, I will call you if anything is abnormal we need to discuss this.  Otherwise is to be in your MyChart  Take the medicines as prescribed  If pain becomes more severe, you are not improving or develop a fever go to the emergency department

## 2020-08-19 NOTE — ED Provider Notes (Signed)
[place]    CSN: [id number] Arrival date & time: 08/19/20  1707      History   Chief Complaint Chief Complaint  Patient presents with  . Abdominal Pain  . Emesis    HPI Nichole Bowers is a 29 y.o. female.   Patient reports for abdominal pain.  She reports symptoms been present for the last 3 to 4 days.  She reports this has been worsening.  She describes up to a 9 out of 10 pain.  She describes it as in the upper center of her belly.  She reports initially started after drinking a Coke and felt a burning sensation in her stomach.  She reports since then she has had aching and burning pain.  She has had some nausea and a few episodes of vomiting, 3 in total over the last 24 hours.  She vomited once this morning.  There is been no blood or dark color or vomit.  Reports some nausea currently.  She reports she has had some loose stool, denies blood or black color.  Bowel movement does not change her pain.  Food has not necessarily changed her pain.  She does report when she is try to eat solid food she has become nauseous and had to vomit at times.  She reports she has been out of drink fluids.  She denies fever, chills.  She has been able to go about her day for the most part able to take care of her children.  She is concerned as the pain is not improved.  She denies painful urination, frequency or urgency.  Denies vaginal discharge irritation or bleeding.     Past Medical History:  Diagnosis Date  . Atrial fibrillation ([place])   . Chlamydia 2019  . Cocaine use    Positive drug screen on 11/03/15  . Gestational diabetes   . Marijuana use    Positive drug screen on 11/03/15  . Sickle cell trait ([place])   . Urinary tract infection   . Ventral hernia without obstruction or gangrene     Patient Active Problem List   Diagnosis Date Noted  . Sickle cell trait ([place]) 12/28/2018  . Ventral hernia without obstruction or gangrene 05/05/2016  . Marijuana use 11/08/2015     Past Surgical History:  Procedure Laterality Date  . CESAREAN SECTION N/A 06/25/2016   Procedure: CESAREAN SECTION;  Surgeon: Osborne Oman, MD;  Location: [facility];  Service: Obstetrics;  Laterality: N/A;  . HERNIA REPAIR  2017  . WISDOM TOOTH EXTRACTION      OB History    Gravida  2   Para  2   Term  2   Preterm  0   AB  0   Living  2     SAB  0   TAB  0   Ectopic  0   Multiple  0   Live Births  2            Home Medications    Prior to Admission medications   Medication Sig Start Date End Date Taking? Authorizing Provider  acetaminophen (TYLENOL) 325 MG tablet Take 650 mg by mouth every 6 (six) hours as needed for mild pain or headache.    [provider]  doxycycline (VIBRAMYCIN) 100 MG capsule Take 1 capsule (100 mg total) by mouth 2 (two) times daily. 01/20/20   Faustino Congress, NP  Elastic Bandages & Supports (COMFORT FIT MATERNITY SUPP LG) MISC 1 each by  Does not apply route daily as needed. 02/08/19   Rasch, Anderson Malta I, NP  famotidine (PEPCID) 20 MG tablet Take 1 tablet (20 mg total) by mouth 2 (two) times daily for 7 days. 08/19/20 08/26/20  Aadhav Uhlig, Marguerita Beards, PA-C  ferrous sulfate 325 (65 FE) MG tablet Take 1 tablet (325 mg total) by mouth daily with breakfast. 06/16/19   Gavin Pound, CNM  lidocaine (XYLOCAINE) 2 % solution Use as directed 15 mLs in the mouth or throat every 6 (six) hours as needed for mouth pain. 12/02/19   Scot Jun, FNP  omeprazole (PRILOSEC) 20 MG capsule Take 1 capsule (20 mg total) by mouth daily for 14 days. 08/19/20 09/02/20  Caston Coopersmith, Marguerita Beards, PA-C  ondansetron (ZOFRAN) 4 MG tablet Take 1 tablet (4 mg total) by mouth every 8 (eight) hours as needed for nausea or vomiting. 08/19/20   Everline Mahaffy, Marguerita Beards, PA-C  Prenatal Vit-Fe Fumarate-FA (PRENATAL MULTIVITAMIN) TABS tablet Take 1 tablet by mouth daily at 12 noon.    [provider]  tobramycin (TOBREX) 0.3 % ophthalmic solution Place 1 drop into both  eyes every 4 (four) hours. 09/24/19   Robyn Haber, MD    Family History Family History  Problem Relation Age of Onset  . Healthy Mother   . Healthy Father   . Asthma Sister   . Diabetes Maternal Grandfather   . Diabetes Paternal Grandfather     Social History Social History   Tobacco Use  . Smoking status: Current Every Day Smoker    Packs/day: 0.20    Types: Cigarettes    Last attempt to quit: 10/29/2018    Years since quitting: 1.8  . Smokeless tobacco: Never Used  . Tobacco comment: 1-2 cigarettes per day  Vaping Use  . Vaping Use: Never used  Substance Use Topics  . Alcohol use: Yes    Comment: weekends  . Drug use: Not Currently    Types: Cocaine, Marijuana     Allergies   Phenergan [promethazine]   Review of Systems Review of Systems   Physical Exam Triage Vital Signs ED Triage Vitals  Enc Vitals Group     BP 08/19/20 1755 131/79     Pulse Rate 08/19/20 1755 82     Resp 08/19/20 1755 18     Temp 08/19/20 1755 98.4 F (36.9 C)     Temp Source 08/19/20 1755 Oral     SpO2 08/19/20 1755 99 %     Weight --      Height --      Head Circumference --      Peak Flow --      Pain Score 08/19/20 1756 10     Pain Loc --      Pain Edu? --      Excl. in [place]? --    No data found.  Updated Vital Signs BP 131/79 (BP Location: Right Arm)   Pulse 82   Temp 98.4 F (36.9 C) (Oral)   Resp 18   SpO2 99%   Visual Acuity Right Eye Distance:   Left Eye Distance:   Bilateral Distance:    Right Eye Near:   Left Eye Near:    Bilateral Near:     Physical Exam Vitals and nursing note reviewed.  Constitutional:      General: She is not in acute distress.    Appearance: She is well-developed. She is not ill-appearing.     Comments: Patient resting easily in the exam room in  no apparent distress.  HENT:     Head: Normocephalic and atraumatic.     Mouth/Throat:     Mouth: Mucous membranes are moist.  Eyes:     General: No scleral icterus.     Extraocular Movements: Extraocular movements intact.     Conjunctiva/sclera: Conjunctivae normal.     Pupils: Pupils are equal, round, and reactive to light.  Cardiovascular:     Rate and Rhythm: Normal rate and regular rhythm.     Heart sounds: No murmur heard.   Pulmonary:     Effort: Pulmonary effort is normal. No respiratory distress.     Breath sounds: Normal breath sounds.  Abdominal:     General: There is no distension.     Palpations: Abdomen is soft. There is no hepatomegaly or splenomegaly.     Tenderness: There is generalized abdominal tenderness. There is no right CVA tenderness, left CVA tenderness, guarding or rebound. Negative signs include Murphy's sign, McBurney's sign and obturator sign.     Hernia: No hernia is present.  Musculoskeletal:     Cervical back: Neck supple.  Skin:    General: Skin is warm and dry.  Neurological:     Mental Status: She is alert.      UC Treatments / Results  Labs (all labs ordered are listed, but only abnormal results are displayed) Labs Reviewed  COMPREHENSIVE METABOLIC PANEL - Abnormal; Notable for the following components:      Result Value   Sodium 134 (*)    All other components within normal limits  CBC  LIPASE, BLOOD    EKG   Radiology No results found.  Procedures Procedures (including critical care time)  Medications Ordered in UC Medications  alum & mag hydroxide-simeth (MAALOX/MYLANTA) 200-200-20 MG/5ML suspension 30 mL (30 mLs Oral Given 08/19/20 1846)    And  lidocaine (XYLOCAINE) 2 % viscous mouth solution 15 mL (15 mLs Oral Given 08/19/20 1846)  ondansetron (ZOFRAN-ODT) disintegrating tablet 4 mg (4 mg Oral Given 08/19/20 1846)    Initial Impression / Assessment and Plan / UC Course  I have reviewed the triage vital signs and the nursing notes.  Pertinent labs & imaging results that were available during my care of the patient were reviewed by me and considered in my medical decision making (see chart  for details).     #Dyspepsia #Abdominal pain #Nausea Patient 29 year old presenting with dyspepsia, nausea and abdominal pain.  She is in no apparent distress, afebrile and nontachycardic.  While she does have fairly significant tenderness on exam, is nonfocal.  She did have fairly significant improvement after GI cocktail and her symptoms, feel this may be GERD.  CMP, lipase and CBC all unremarkable and reassuring, so low suspicion for pancreatitis, biliary or hepatic etiology.  Will discharge on PPI, Pepcid and nausea medication.  Discussed strict emergency department precautions.  Patient verbalized agreement and understanding plan of care. Final Clinical Impressions(s) / UC Diagnoses   Final diagnoses:  Nausea and vomiting, intractability of vomiting not specified, unspecified vomiting type  Dyspepsia  Generalized abdominal pain     Discharge Instructions     I have sent basic labs, I will call you if anything is abnormal we need to discuss this.  Otherwise is to be in your MyChart  Take the medicines as prescribed  If pain becomes more severe, you are not improving or develop a fever go to the emergency department      ED Prescriptions    Medication Sig  Dispense Auth. Provider   ondansetron (ZOFRAN) 4 MG tablet Take 1 tablet (4 mg total) by mouth every 8 (eight) hours as needed for nausea or vomiting. 6 tablet Jovanny Stephanie, Marguerita Beards, PA-C   omeprazole (PRILOSEC) 20 MG capsule Take 1 capsule (20 mg total) by mouth daily for 14 days. 14 capsule Garrie Woodin, Marguerita Beards, PA-C   famotidine (PEPCID) 20 MG tablet Take 1 tablet (20 mg total) by mouth 2 (two) times daily for 7 days. 14 tablet Ayasha Ellingsen, Marguerita Beards, PA-C     PDMP not reviewed this encounter.   Purnell Shoemaker, PA-C 08/19/20 2055

## 2021-07-16 ENCOUNTER — Other Ambulatory Visit: Payer: Self-pay

## 2021-07-16 ENCOUNTER — Emergency Department (HOSPITAL_COMMUNITY): Payer: Medicaid Other

## 2021-07-16 ENCOUNTER — Emergency Department (HOSPITAL_COMMUNITY)
Admission: EM | Admit: 2021-07-16 | Discharge: 2021-07-17 | Disposition: A | Discharge status: Home / Self Care | Payer: Medicaid Other | Attending: Emergency Medicine | Admitting: Emergency Medicine

## 2021-07-16 DIAGNOSIS — U071 COVID-19: Secondary | ICD-10-CM | POA: Insufficient documentation

## 2021-07-16 DIAGNOSIS — R519 Headache, unspecified: Secondary | ICD-10-CM

## 2021-07-16 DIAGNOSIS — F17210 Nicotine dependence, cigarettes, uncomplicated: Secondary | ICD-10-CM | POA: Diagnosis not present

## 2021-07-16 LAB — RESP PANEL BY RT-PCR (FLU A&B, COVID) ARPGX2
Influenza A by PCR: NEGATIVE
Influenza B by PCR: NEGATIVE
SARS Coronavirus 2 by RT PCR: POSITIVE — AB

## 2021-07-16 MED ORDER — ACETAMINOPHEN 500 MG PO TABS
1000.0000 mg | ORAL_TABLET | Freq: Once | ORAL | Status: AC
  Administered 2021-07-16: 1000 mg via ORAL
  Filled 2021-07-16: qty 2

## 2021-07-16 MED ORDER — KETOROLAC TROMETHAMINE 30 MG/ML IJ SOLN
15.0000 mg | Freq: Once | INTRAMUSCULAR | Status: AC
  Administered 2021-07-16: 15 mg via INTRAVENOUS
  Filled 2021-07-16: qty 1

## 2021-07-16 MED ORDER — DIPHENHYDRAMINE HCL 50 MG/ML IJ SOLN
25.0000 mg | Freq: Once | INTRAMUSCULAR | Status: AC
  Administered 2021-07-16: 25 mg via INTRAVENOUS
  Filled 2021-07-16: qty 1

## 2021-07-16 MED ORDER — SODIUM CHLORIDE 0.9 % IV BOLUS
1000.0000 mL | Freq: Once | INTRAVENOUS | Status: AC
  Administered 2021-07-16: 1000 mL via INTRAVENOUS

## 2021-07-16 MED ORDER — METOCLOPRAMIDE HCL 5 MG/ML IJ SOLN
10.0000 mg | Freq: Once | INTRAMUSCULAR | Status: AC
  Administered 2021-07-16: 10 mg via INTRAVENOUS
  Filled 2021-07-16: qty 2

## 2021-07-16 NOTE — ED Triage Notes (Signed)
Pt requesting CT scan because of headaches x 1 week. Reports associated tingling in hands and blurry vision. Taking OTC meds without relief. Also reports cough and nasal congestion since last night. Also reporting a bald spot on her head.

## 2021-07-16 NOTE — ED Provider Notes (Signed)
[facility] EMERGENCY DEPARTMENT Provider Note   CSN: [id number] Arrival date & time: 07/16/21  1623     History Chief Complaint  Patient presents with   Cough   Headache    Nichole Bowers is a 30 y.o. female.  Patient is a 30 year old female with a history of prior cocaine use, A. fib, sickle cell trait who presents with a headache.  She said that she has had a headache for about a week.  She describes it as a achy pain in the front part of her head bilaterally.  She says it has been gradually worsening over the last week.  It started very mild but gradually got worse.  It stays in the front part of her head.  There is no pain down her spine.  No fevers.  No nausea or vomiting.  She is also had some cough and congestion for the last 2 or 3 days.  She has some sinus congestion and generalized achiness.  No known fevers.  No urinary symptoms.  No shortness of breath.  No numbness or weakness to her extremities.  She does have a history of migraines but says this headache is worse than her prior headaches.  Normally she can take an over-the-counter medicine and they resolved but that has not been the case with this headache.  She has had a cough which has been productive of mucus and occasionally has some small streaks of blood.      Past Medical History:  Diagnosis Date   Atrial fibrillation ([place])    Chlamydia 2019   Cocaine use    Positive drug screen on 11/03/15   Gestational diabetes    Marijuana use    Positive drug screen on 11/03/15   Sickle cell trait ([place])    Urinary tract infection    Ventral hernia without obstruction or gangrene     Patient Active Problem List   Diagnosis Date Noted   Sickle cell trait ([place]) 12/28/2018   Ventral hernia without obstruction or gangrene 05/05/2016   Marijuana use 11/08/2015    Past Surgical History:  Procedure Laterality Date   CESAREAN SECTION N/A 06/25/2016   Procedure: CESAREAN SECTION;  Surgeon: Osborne Oman, MD;  Location: [facility];  Service: Obstetrics;  Laterality: N/A;   HERNIA REPAIR  2017   WISDOM TOOTH EXTRACTION       OB History     Gravida  2   Para  2   Term  2   Preterm  0   AB  0   Living  2      SAB  0   IAB  0   Ectopic  0   Multiple  0   Live Births  2           Family History  Problem Relation Age of Onset   Healthy Mother    Healthy Father    Asthma Sister    Diabetes Maternal Grandfather    Diabetes Paternal Grandfather     Social History   Tobacco Use   Smoking status: Every Day    Packs/day: 0.20    Types: Cigarettes    Last attempt to quit: 10/29/2018    Years since quitting: 2.7   Smokeless tobacco: Never   Tobacco comments:    1-2 cigarettes per day  Vaping Use   Vaping Use: Never used  Substance Use Topics   Alcohol use: Yes    Comment: weekends  Drug use: Not Currently    Types: Cocaine, Marijuana    Home Medications Prior to Admission medications   Medication Sig Start Date End Date Taking? Authorizing Provider  Aspirin-Salicylamide-Caffeine (BC HEADACHE POWDER PO) Take 1 packet by mouth daily as needed (headache).   Yes [provider]  Multiple Vitamins-Calcium (ONE-A-DAY WOMENS FORMULA) TABS Take 1 tablet by mouth daily.   Yes [provider]  doxycycline (VIBRAMYCIN) 100 MG capsule Take 1 capsule (100 mg total) by mouth 2 (two) times daily. Patient not taking: Reported on 07/16/2021 01/20/20   Faustino Congress, NP  Elastic Bandages & Supports (COMFORT FIT MATERNITY SUPP LG) MISC 1 each by Does not apply route daily as needed. 02/08/19   Rasch, Anderson Malta I, NP  famotidine (PEPCID) 20 MG tablet Take 1 tablet (20 mg total) by mouth 2 (two) times daily for 7 days. 08/19/20 08/26/20  Darr, Edison Nasuti, PA-C  ferrous sulfate 325 (65 FE) MG tablet Take 1 tablet (325 mg total) by mouth daily with breakfast. Patient not taking: Reported on 07/16/2021 06/16/19   Gavin Pound, CNM  lidocaine  (XYLOCAINE) 2 % solution Use as directed 15 mLs in the mouth or throat every 6 (six) hours as needed for mouth pain. Patient not taking: Reported on 07/16/2021 12/02/19   Scot Jun, FNP  omeprazole (PRILOSEC) 20 MG capsule Take 1 capsule (20 mg total) by mouth daily for 14 days. 08/19/20 09/02/20  Darr, Edison Nasuti, PA-C  ondansetron (ZOFRAN) 4 MG tablet Take 1 tablet (4 mg total) by mouth every 8 (eight) hours as needed for nausea or vomiting. 08/19/20   Darr, Edison Nasuti, PA-C  tobramycin (TOBREX) 0.3 % ophthalmic solution Place 1 drop into both eyes every 4 (four) hours. Patient not taking: Reported on 07/16/2021 09/24/19   Robyn Haber, MD    Allergies    Phenergan [promethazine]  Review of Systems   Review of Systems  Constitutional:  Positive for fatigue. Negative for chills, diaphoresis and fever.  HENT:  Positive for congestion and rhinorrhea. Negative for sneezing.   Eyes: Negative.   Respiratory:  Positive for cough. Negative for chest tightness and shortness of breath.   Cardiovascular:  Negative for chest pain and leg swelling.  Gastrointestinal:  Negative for abdominal pain, blood in stool, diarrhea, nausea and vomiting.  Genitourinary:  Negative for difficulty urinating, flank pain, frequency and hematuria.  Musculoskeletal:  Positive for myalgias. Negative for arthralgias and back pain.  Skin:  Negative for rash.  Neurological:  Positive for headaches. Negative for dizziness, speech difficulty, weakness and numbness.   Physical Exam Updated Vital Signs BP 117/79   Pulse 85   Temp (!) 100.6 F (38.1 C) (Oral)   Resp 16   SpO2 100%   Physical Exam Constitutional:      Appearance: She is well-developed.  HENT:     Head: Normocephalic and atraumatic.  Eyes:     Pupils: Pupils are equal, round, and reactive to light.  Neck:     Comments: No meningismus Cardiovascular:     Rate and Rhythm: Normal rate and regular rhythm.     Heart sounds: Normal heart sounds.   Pulmonary:     Effort: Pulmonary effort is normal. No respiratory distress.     Breath sounds: Normal breath sounds. No wheezing or rales.  Chest:     Chest wall: No tenderness.  Abdominal:     General: Bowel sounds are normal.     Palpations: Abdomen is soft.     Tenderness: There  is no abdominal tenderness. There is no guarding or rebound.  Musculoskeletal:        General: Normal range of motion.     Cervical back: Normal range of motion and neck supple.  Lymphadenopathy:     Cervical: No cervical adenopathy.  Skin:    General: Skin is warm and dry.     Findings: No rash.  Neurological:     Mental Status: She is alert and oriented to person, place, and time.    ED Results / Procedures / Treatments   Labs (all labs ordered are listed, but only abnormal results are displayed) Labs Reviewed  RESP PANEL BY RT-PCR (FLU A&B, COVID) [id number]    EKG None  Radiology DG Chest 2 View  Result Date: 07/16/2021 CLINICAL DATA:  Cough.  Headache for 1 week. EXAM: CHEST - 2 VIEW COMPARISON:  Chest x-ray 05/31/2012 FINDINGS: The heart and mediastinal contours are within normal limits. No focal consolidation. No pulmonary edema. No pleural effusion. No pneumothorax. No acute osseous abnormality. IMPRESSION: No active cardiopulmonary disease. Electronically Signed   By: Iven Finn M.D.   On: 07/16/2021 23:37   CT Head Wo Contrast  Result Date: 07/16/2021 CLINICAL DATA:  Chronic headache. EXAM: CT HEAD WITHOUT CONTRAST TECHNIQUE: Contiguous axial images were obtained from the base of the skull through the vertex without intravenous contrast. COMPARISON:  October 06, 2006 FINDINGS: Brain: No evidence of acute infarction, hemorrhage, hydrocephalus, extra-axial collection or mass lesion/mass effect. Stable ventricular size is noted. Vascular: No hyperdense vessel or unexpected calcification. Skull: Normal. Negative for fracture or focal lesion. Sinuses/Orbits: There is mild to moderate severity  bilateral ethmoid sinus mucosal thickening. Other: None. IMPRESSION: 1. No acute intracranial abnormality. 2. Mild to moderate severity bilateral ethmoid sinus disease. Electronically Signed   By: Virgina Norfolk M.D.   On: 07/16/2021 23:29    Procedures Procedures   Medications Ordered in ED Medications  sodium chloride 0.9 % bolus 1,000 mL (1,000 mLs Intravenous New Bag/Given 07/16/21 2253)  metoCLOPramide (REGLAN) injection 10 mg (10 mg Intravenous Given 07/16/21 2254)  ketorolac (TORADOL) 30 MG/ML injection 15 mg (15 mg Intravenous Given 07/16/21 2254)  diphenhydrAMINE (BENADRYL) injection 25 mg (25 mg Intravenous Given 07/16/21 2254)  acetaminophen (TYLENOL) tablet 1,000 mg (1,000 mg Oral Given 07/16/21 2336)    ED Course  I have reviewed the triage vital signs and the nursing notes.  Pertinent labs & imaging results that were available during my care of the patient were reviewed by me and considered in my medical decision making (see chart for details).    MDM Rules/Calculators/A&P                           Patient is a 30 year old female who presents with a 1 week history of gradually worsening headache.  She has no meningismus.  No neurologic deficits.  Noticed symptoms that sound more concerning for subarachnoid hemorrhage.  Her head CT shows no acute abnormalities.  She does have a temperature here and some URI symptoms of been going on for the last 2 days.  She had some small amounts of hemoptysis but no shortness of breath.  Chest x-ray shows no evidence of pneumonia.  She was given a migraine cocktail and is waiting her COVID/flu test.  Dr. Stark Jock to take over pending reevaluation. Final Clinical Impression(s) / ED Diagnoses Final diagnoses:  Acute nonintractable headache, unspecified headache type    Rx / DC Orders ED  Discharge Orders     None        Malvin Johns, MD 07/16/21 2348

## 2021-07-16 NOTE — ED Provider Notes (Signed)
Emergency Medicine Provider Triage Evaluation Note  Nichole Bowers , a 30 y.o. female  was evaluated in triage.  Pt complains of headache, nasal congestion, and productive cough.  Patient reports that she has been having headaches intermittently over the last week.  Pain is located to frontotemporal aspect of the head.  Headache onset is gradual and pain is progressively worse over time.  Pain is worse with loud noises.  Minimal relief with OTC medications.  Patient endorses tingling sensation to bilateral hands and blurry vision associated with her headaches.  Reports history of migraine headaches in the past but denies any medications for them.  Patient reports that she started developing nasal congestion and productive cough last night.  Cough is producing mucus with minimal streaks of blood in it.  Reports subjective fevers.  Denies any known sick contacts.  Patient has not been vaccinated for COVID-19.  Review of Systems  Positive: Headache, tingling station to bilateral hands, blurry vision, nasal congestion, Negative: Numbness, weakness, facial asymmetry, aphasia, dysphagia, chills  Physical Exam  BP 134/86   Pulse 97   Temp 99.7 F (37.6 C)   Resp 16   SpO2 100%  Gen:   Awake, no distress   Resp:  Normal effort, lungs clear to auscultation bilaterally MSK:   Moves extremities without difficulty  Other:  CN II through XII intact.  Pronator drift negative, grip strength equal, sensation to light touch intact to bilateral upper and lower extremities.  Patient able to stand and ambulate without difficulty  Medical Decision Making  Medically screening exam initiated at 5:29 PM.  Appropriate orders placed.  Bernestine Nalley was informed that the remainder of the evaluation will be completed by another provider, this initial triage assessment does not replace that evaluation, and the importance of remaining in the ED until their evaluation is complete.  The patient appears stable so  that the remainder of the work up may be completed by another provider.      Loni Beckwith, PA-C [id number]    Lianne Cure, DO [id number]

## 2021-07-17 NOTE — Discharge Instructions (Addendum)
Drink plenty of fluids and get plenty of rest.  Take Tylenol 1000 mg rotated with ibuprofen 600 mg every 4 hours as needed for fever or pain.  Take over the counter medications as needed for relief of symptoms.  Return to the emergency department if you develop severe chest pain, difficulty breathing, or other new and concerning symptoms.

## 2021-07-17 NOTE — ED Provider Notes (Signed)
  Physical Exam  BP 117/79   Pulse 85   Temp 99.8 F (37.7 C) (Oral)   Resp 16   SpO2 100%   Physical Exam Constitutional:      General: She is not in acute distress.    Appearance: She is well-developed. She is not ill-appearing.  HENT:     Head: Normocephalic and atraumatic.  Pulmonary:     Effort: Pulmonary effort is normal.  Musculoskeletal:     Cervical back: Normal range of motion and neck supple.  Neurological:     Mental Status: She is alert.    ED Course/Procedures     Procedures  MDM  Care assumed from Dr. Tamera Punt at shift change.  Patient is awaiting results of a COVID test.  She has been feeling poorly for several days, then became worse today.  She had low-grade fever of 100.6 while here in the ER.  Patient's laboratory studies are unremarkable.  CT of the head shows mild sinus disease.  COVID test has returned and is positive.  Patient will be discharged with over-the-counter medications and 5-day quarantine.  She is to return as needed for any problems.       Veryl Speak, MD 07/17/21 [phone]

## 2021-09-23 ENCOUNTER — Emergency Department (HOSPITAL_COMMUNITY)
Admission: EM | Admit: 2021-09-23 | Discharge: 2021-09-23 | Disposition: A | Discharge status: Home / Self Care | Payer: Medicaid Other | Attending: Emergency Medicine | Admitting: Emergency Medicine

## 2021-09-23 ENCOUNTER — Encounter (HOSPITAL_COMMUNITY): Payer: Self-pay | Admitting: *Deleted

## 2021-09-23 ENCOUNTER — Other Ambulatory Visit: Payer: Self-pay

## 2021-09-23 ENCOUNTER — Emergency Department (HOSPITAL_COMMUNITY): Payer: Medicaid Other

## 2021-09-23 DIAGNOSIS — Z5321 Procedure and treatment not carried out due to patient leaving prior to being seen by health care provider: Secondary | ICD-10-CM | POA: Diagnosis not present

## 2021-09-23 DIAGNOSIS — R5383 Other fatigue: Secondary | ICD-10-CM | POA: Insufficient documentation

## 2021-09-23 DIAGNOSIS — R0602 Shortness of breath: Secondary | ICD-10-CM | POA: Insufficient documentation

## 2021-09-23 DIAGNOSIS — R0789 Other chest pain: Secondary | ICD-10-CM | POA: Diagnosis present

## 2021-09-23 LAB — CBC
HCT: 41.4 % (ref 36.0–46.0)
Hemoglobin: 13.4 g/dL (ref 12.0–15.0)
MCH: 27.8 pg (ref 26.0–34.0)
MCHC: 32.4 g/dL (ref 30.0–36.0)
MCV: 85.9 fL (ref 80.0–100.0)
Platelets: 290 10*3/uL (ref 150–400)
RBC: 4.82 MIL/uL (ref 3.87–5.11)
RDW: 13.6 % (ref 11.5–15.5)
WBC: 5.7 10*3/uL (ref 4.0–10.5)
nRBC: 0 % (ref 0.0–0.2)

## 2021-09-23 LAB — BASIC METABOLIC PANEL
Anion gap: 9 (ref 5–15)
BUN: 10 mg/dL (ref 6–20)
CO2: 24 mmol/L (ref 22–32)
Calcium: 9.2 mg/dL (ref 8.9–10.3)
Chloride: 103 mmol/L (ref 98–111)
Creatinine, Ser: 0.81 mg/dL (ref 0.44–1.00)
GFR, Estimated: >60 mL/min (ref >60)
Glucose, Bld: 90 mg/dL (ref 70–99)
Potassium: 3.9 mmol/L (ref 3.5–5.1)
Sodium: 136 mmol/L (ref 135–145)

## 2021-09-23 LAB — I-STAT BETA HCG BLOOD, ED (MC, WL, AP ONLY): I-stat hCG, quantitative: <5 m[IU]/mL (ref <5)

## 2021-09-23 LAB — TROPONIN I (HIGH SENSITIVITY)
Troponin I (High Sensitivity): 4 ng/L (ref <18)
Troponin I (High Sensitivity): 3 ng/L (ref <18)

## 2021-09-23 MED ORDER — ACETAMINOPHEN 325 MG PO TABS: 650.0000 mg | ORAL_TABLET | Freq: Once | ORAL | Status: DC

## 2021-09-23 NOTE — ED Triage Notes (Signed)
Pt arrived by [facility]. Reports left side chest wall pain into her shoulder. Denies n/v or sob. Pain increases with movement, breathing and palpation.

## 2021-09-23 NOTE — ED Provider Notes (Signed)
Emergency Medicine Provider Triage Evaluation Note  Kevionna Heffler , a 30 y.o. female  was evaluated in triage.  Pt complains of pain starting at 8 AM this morning.  Reports pain worse in the left side of her chest, with increased fatigue and shortness of breath at work.  States her pain is made worse with movement, breathing, and palpation.  Review of Systems  Positive: Pain, shortness of breath, fatigue, headache Negative: Nausea, vomiting, abdominal pain, diarrhea, cough, congestion  Physical Exam  BP 130/90 (BP Location: Right Arm)   Pulse 83   Temp 98.6 F (37 C) (Oral)   Resp 18   LMP 09/16/2021   SpO2 98%  Gen:   Awake, no distress   Resp:  Normal effort  MSK:   Moves extremities without difficulty  Other:    Medical Decision Making  Medically screening exam initiated at 12:32 PM.  Appropriate orders placed.  Krisa Blattner was informed that the remainder of the evaluation will be completed by another provider, this initial triage assessment does not replace that evaluation, and the importance of remaining in the ED until their evaluation is complete.     Estill Cotta 09/23/21 1233    Pattricia Boss, MD 09/23/21 [phone]

## 2021-09-23 NOTE — ED Notes (Signed)
Patient states she will go to urgent care tomorrow to get her results. Left d/t wait time

## 2021-09-24 ENCOUNTER — Encounter (HOSPITAL_COMMUNITY): Payer: Self-pay | Admitting: *Deleted

## 2021-09-24 ENCOUNTER — Other Ambulatory Visit: Payer: Self-pay

## 2021-09-24 ENCOUNTER — Ambulatory Visit (HOSPITAL_COMMUNITY)
Admission: EM | Admit: 2021-09-24 | Discharge: 2021-09-24 | Disposition: A | Discharge status: Home / Self Care | Payer: Medicaid Other | Attending: Student | Admitting: Student

## 2021-09-24 DIAGNOSIS — R0789 Other chest pain: Secondary | ICD-10-CM

## 2021-09-24 MED ORDER — ALBUTEROL SULFATE HFA 108 (90 BASE) MCG/ACT IN AERS: 1.0000 | INHALATION_SPRAY | Freq: Four times a day (QID) | RESPIRATORY_TRACT | 0 refills | Status: DC | PRN

## 2021-09-24 NOTE — ED Triage Notes (Signed)
Pt reports last night left ed last night due to wait time.Pt reports still having CP 6/10 today.

## 2021-09-24 NOTE — Discharge Instructions (Addendum)
-  Albuterol inhaler as needed for cough, wheezing, shortness of breath, 1 to 2 puffs every 6 hours as needed. -Follow-up if chest pain at rest, shortness of breath, weakness

## 2021-09-24 NOTE — ED Provider Notes (Signed)
Nichole Bowers    CSN: [id number] Arrival date & time: 09/24/21  1249      History   Chief Complaint Chief Complaint  Patient presents with   lab results    HPI Nichole Bowers is a 30 y.o. female presenting with chest wall pain for 2 days.  Medical history A. fib, chlamydia, cocaine use, marijuana use, sickle cell trait.  She describes chest wall pain with movement and deep inspiration for about 2 days, associated with shortness of breath due to not being able to take a deep breath.  Denies recent trauma or overuse, but does state she is active at work and with her kids.  She went to the emergency department 1 day ago and had lab work and an EKG done but left without being seen.  She did have 1 episode at work 1 day ago where her heart seemed like it was racing and she felt lightheaded, this lasted for about 20 minutes and EMS was called.  Denies any further episodes like this, she did not lose consciousness.  HPI  Past Medical History:  Diagnosis Date   Atrial fibrillation ([place])    Chlamydia 2019   Cocaine use    Positive drug screen on 11/03/15   Gestational diabetes    Marijuana use    Positive drug screen on 11/03/15   Sickle cell trait ([place])    Urinary tract infection    Ventral hernia without obstruction or gangrene     Patient Active Problem List   Diagnosis Date Noted   Sickle cell trait ([place]) 12/28/2018   Ventral hernia without obstruction or gangrene 05/05/2016   Marijuana use 11/08/2015    Past Surgical History:  Procedure Laterality Date   CESAREAN SECTION N/A 06/25/2016   Procedure: CESAREAN SECTION;  Surgeon: Osborne Oman, MD;  Location: [facility];  Service: Obstetrics;  Laterality: N/A;   HERNIA REPAIR  2017   WISDOM TOOTH EXTRACTION      OB History     Gravida  2   Para  2   Term  2   Preterm  0   AB  0   Living  2      SAB  0   IAB  0   Ectopic  0   Multiple  0   Live Births  2            Home  Medications    Prior to Admission medications   Medication Sig Start Date End Date Taking? Authorizing Provider  albuterol (VENTOLIN HFA) 108 (90 Base) MCG/ACT inhaler Inhale 1-2 puffs into the lungs every 6 (six) hours as needed for wheezing or shortness of breath. 09/24/21  Yes Hazel Sams, PA-C  Aspirin-Salicylamide-Caffeine (BC HEADACHE POWDER PO) Take 1 packet by mouth daily as needed (headache).    [provider]  famotidine (PEPCID) 20 MG tablet Take 1 tablet (20 mg total) by mouth 2 (two) times daily for 7 days. 08/19/20 08/26/20  Darr, Edison Nasuti, PA-C  Multiple Vitamins-Calcium (ONE-A-DAY WOMENS FORMULA) TABS Take 1 tablet by mouth daily.    [provider]  omeprazole (PRILOSEC) 20 MG capsule Take 1 capsule (20 mg total) by mouth daily for 14 days. 08/19/20 09/02/20  Darr, Edison Nasuti, PA-C  ondansetron (ZOFRAN) 4 MG tablet Take 1 tablet (4 mg total) by mouth every 8 (eight) hours as needed for nausea or vomiting. 08/19/20   Darr, Edison Nasuti, PA-C    Family History Family History  Problem Relation  Age of Onset   Healthy Mother    Healthy Father    Asthma Sister    Diabetes Maternal Grandfather    Diabetes Paternal Grandfather     Social History Social History   Tobacco Use   Smoking status: Every Day    Packs/day: 0.20    Types: Cigarettes    Last attempt to quit: 10/29/2018    Years since quitting: 2.9   Smokeless tobacco: Never   Tobacco comments:    1-2 cigarettes per day  Vaping Use   Vaping Use: Never used  Substance Use Topics   Alcohol use: Yes    Comment: weekends   Drug use: Not Currently    Types: Cocaine, Marijuana     Allergies   Phenergan [promethazine]   Review of Systems Review of Systems  Respiratory:  Positive for shortness of breath.   All other systems reviewed and are negative.   Physical Exam Triage Vital Signs ED Triage Vitals [09/24/21 1426]  Enc Vitals Group     BP      Pulse      Resp      Temp      Temp src      SpO2       Weight      Height      Head Circumference      Peak Flow      Pain Score 6     Pain Loc      Pain Edu?      Excl. in [place]?    No data found.  Updated Vital Signs BP 120/64   Pulse 72   Temp 98.4 F (36.9 C)   Resp 18   LMP 09/16/2021   SpO2 100%   Visual Acuity Right Eye Distance:   Left Eye Distance:   Bilateral Distance:    Right Eye Near:   Left Eye Near:    Bilateral Near:     Physical Exam Vitals reviewed.  Constitutional:      Appearance: Normal appearance. She is not diaphoretic.  HENT:     Head: Normocephalic and atraumatic.     Mouth/Throat:     Mouth: Mucous membranes are moist.  Eyes:     Extraocular Movements: Extraocular movements intact.     Pupils: Pupils are equal, round, and reactive to light.  Cardiovascular:     Rate and Rhythm: Normal rate and regular rhythm.     Pulses:          Radial pulses are 2+ on the right side and 2+ on the left side.     Heart sounds: Normal heart sounds.  Pulmonary:     Effort: Pulmonary effort is normal.     Breath sounds: Normal breath sounds.  Chest:     Chest wall: Tenderness present.  Abdominal:     Palpations: Abdomen is soft.     Tenderness: There is no abdominal tenderness. There is no guarding or rebound.  Musculoskeletal:     Right lower leg: No edema.     Left lower leg: No edema.  Skin:    General: Skin is warm.     Capillary Refill: Capillary refill takes less than 2 seconds.  Neurological:     General: No focal deficit present.     Mental Status: She is alert and oriented to person, place, and time.  Psychiatric:        Mood and Affect: Mood normal.        Behavior:  Behavior normal.        Thought Content: Thought content normal.        Judgment: Judgment normal.     UC Treatments / Results  Labs (all labs ordered are listed, but only abnormal results are displayed) Labs Reviewed - No data to display  EKG   Radiology DG Chest 2 View  Result Date: 09/23/2021 CLINICAL  DATA:  Chest pain EXAM: CHEST - 2 VIEW COMPARISON:  07/16/2021 FINDINGS: The heart size and mediastinal contours are within normal limits. No focal airspace consolidation, pleural effusion, or pneumothorax. The visualized skeletal structures are unremarkable. IMPRESSION: No active cardiopulmonary disease. Electronically Signed   By: Davina Poke D.O.   On: 09/23/2021 13:17    Procedures Procedures (including critical care time)  Medications Ordered in UC Medications - No data to display  Initial Impression / Assessment and Plan / UC Course  I have reviewed the triage vital signs and the nursing notes.  Pertinent labs & imaging results that were available during my care of the patient were reviewed by me and considered in my medical decision making (see chart for details).     This patient is a very pleasant 30 y.o. year old female presenting with chest wall pain.   Chest wall pain is reproducible. Last night's EKG NSR, and labwork including troponins and HCG were negative.  Reassurance provided. Suspect there is an underlying asthma component given strong family history of this and SOB on exertion. Trial of albuterol inhaler.   ED return precautions discussed. Patient verbalizes understanding and agreement.    Final Clinical Impressions(s) / UC Diagnoses   Final diagnoses:  Chest wall pain     Discharge Instructions      -Albuterol inhaler as needed for cough, wheezing, shortness of breath, 1 to 2 puffs every 6 hours as needed. -Follow-up if chest pain at rest, shortness of breath, weakness     ED Prescriptions     Medication Sig Dispense Auth. Provider   albuterol (VENTOLIN HFA) 108 (90 Base) MCG/ACT inhaler Inhale 1-2 puffs into the lungs every 6 (six) hours as needed for wheezing or shortness of breath. 1 each Hazel Sams, PA-C      PDMP not reviewed this encounter.   Hazel Sams, PA-C 09/24/21 1450

## 2021-10-27 ENCOUNTER — Ambulatory Visit (HOSPITAL_COMMUNITY)
Admission: EM | Admit: 2021-10-27 | Discharge: 2021-10-27 | Disposition: A | Discharge status: Home / Self Care | Payer: Medicaid Other | Attending: Emergency Medicine | Admitting: Emergency Medicine

## 2021-10-27 ENCOUNTER — Other Ambulatory Visit: Payer: Self-pay

## 2021-10-27 ENCOUNTER — Encounter (HOSPITAL_COMMUNITY): Payer: Self-pay

## 2021-10-27 DIAGNOSIS — J069 Acute upper respiratory infection, unspecified: Secondary | ICD-10-CM

## 2021-10-27 DIAGNOSIS — J029 Acute pharyngitis, unspecified: Secondary | ICD-10-CM | POA: Diagnosis present

## 2021-10-27 LAB — RESPIRATORY PANEL BY PCR

## 2021-10-27 LAB — POCT RAPID STREP A, ED / UC: Streptococcus, Group A Screen (Direct): NEGATIVE

## 2021-10-27 MED ORDER — LIDOCAINE VISCOUS HCL 2 % MT SOLN: 15.0000 mL | OROMUCOSAL | 0 refills | Status: DC | PRN

## 2021-10-27 NOTE — ED Triage Notes (Signed)
Pt presents to the office for sore throat x 2 days. She is requesting a flu,strep and covid test.

## 2021-10-27 NOTE — ED Provider Notes (Signed)
[facility]    CSN: [id number] Arrival date & time: 10/27/21  1022      History   Chief Complaint Chief Complaint  Patient presents with   Sore Throat    HPI Nichole Bowers is a 30 y.o. female. She reports feeling ill 10/25/21 with sore throat and congestion. Is worried she could have strep or the flu or covid. Her mother recently had strep and pt says she has white patches on her tonsils. Denies fever, cough, body aches. Has taken tylenol and ibuprofen for pain with minimal relief.    Sore Throat Pertinent negatives include no shortness of breath.   Past Medical History:  Diagnosis Date   Atrial fibrillation ([place])    Chlamydia 2019   Cocaine use    Positive drug screen on 11/03/15   Gestational diabetes    Marijuana use    Positive drug screen on 11/03/15   Sickle cell trait ([place])    Urinary tract infection    Ventral hernia without obstruction or gangrene     Patient Active Problem List   Diagnosis Date Noted   Sickle cell trait ([place]) 12/28/2018   Ventral hernia without obstruction or gangrene 05/05/2016   Marijuana use 11/08/2015    Past Surgical History:  Procedure Laterality Date   CESAREAN SECTION N/A 06/25/2016   Procedure: CESAREAN SECTION;  Surgeon: Osborne Oman, MD;  Location: [facility];  Service: Obstetrics;  Laterality: N/A;   HERNIA REPAIR  2017   WISDOM TOOTH EXTRACTION      OB History     Gravida  2   Para  2   Term  2   Preterm  0   AB  0   Living  2      SAB  0   IAB  0   Ectopic  0   Multiple  0   Live Births  2            Home Medications    Prior to Admission medications   Medication Sig Start Date End Date Taking? Authorizing Provider  lidocaine (XYLOCAINE) 2 % solution Use as directed 15 mLs in the mouth or throat as needed for mouth pain. 10/27/21  Yes Carvel Getting, NP  albuterol (VENTOLIN HFA) 108 (90 Base) MCG/ACT inhaler Inhale 1-2 puffs into the lungs every 6 (six) hours as  needed for wheezing or shortness of breath. 09/24/21   Hazel Sams, PA-C  Aspirin-Salicylamide-Caffeine (BC HEADACHE POWDER PO) Take 1 packet by mouth daily as needed (headache).    [provider]  famotidine (PEPCID) 20 MG tablet Take 1 tablet (20 mg total) by mouth 2 (two) times daily for 7 days. 08/19/20 08/26/20  Darr, Edison Nasuti, PA-C  Multiple Vitamins-Calcium (ONE-A-DAY WOMENS FORMULA) TABS Take 1 tablet by mouth daily.    [provider]  omeprazole (PRILOSEC) 20 MG capsule Take 1 capsule (20 mg total) by mouth daily for 14 days. 08/19/20 09/02/20  Darr, Edison Nasuti, PA-C  ondansetron (ZOFRAN) 4 MG tablet Take 1 tablet (4 mg total) by mouth every 8 (eight) hours as needed for nausea or vomiting. 08/19/20   Darr, Edison Nasuti, PA-C    Family History Family History  Problem Relation Age of Onset   Healthy Mother    Healthy Father    Asthma Sister    Diabetes Maternal Grandfather    Diabetes Paternal Grandfather     Social History Social History   Tobacco Use   Smoking status: Every Day  Packs/day: 0.20    Types: Cigarettes    Last attempt to quit: 10/29/2018    Years since quitting: 2.9   Smokeless tobacco: Never   Tobacco comments:    1-2 cigarettes per day  Vaping Use   Vaping Use: Never used  Substance Use Topics   Alcohol use: Yes    Comment: weekends   Drug use: Not Currently    Types: Cocaine, Marijuana     Allergies   Phenergan [promethazine]   Review of Systems Review of Systems  Constitutional:  Positive for chills. Negative for fever.  HENT:  Positive for congestion, postnasal drip and sore throat.   Respiratory:  Negative for cough, shortness of breath and wheezing.     Physical Exam Triage Vital Signs ED Triage Vitals  Enc Vitals Group     BP 10/27/21 1308 99/68     Pulse Rate 10/27/21 1308 88     Resp 10/27/21 1308 16     Temp 10/27/21 1308 97.6 F (36.4 C)     Temp Source 10/27/21 1308 Oral     SpO2 10/27/21 1308 100 %     Weight --       Height --      Head Circumference --      Peak Flow --      Pain Score 10/27/21 1309 10     Pain Loc --      Pain Edu? --      Excl. in [place]? --    No data found.  Updated Vital Signs BP 99/68 (BP Location: Right Arm)   Pulse 88   Temp 97.6 F (36.4 C) (Oral)   Resp 16   LMP 09/23/2021   SpO2 100%   Visual Acuity Right Eye Distance:   Left Eye Distance:   Bilateral Distance:    Right Eye Near:   Left Eye Near:    Bilateral Near:     Physical Exam Constitutional:      Appearance: She is well-developed. She is obese. She is ill-appearing.  HENT:     Right Ear: Tympanic membrane, ear canal and external ear normal.     Left Ear: Tympanic membrane, ear canal and external ear normal.     Nose: Congestion present.     Mouth/Throat:     Mouth: Mucous membranes are moist.     Pharynx: Posterior oropharyngeal erythema present.     Tonsils: Tonsillar exudate present. 4+ on the right. 4+ on the left.  Neck:     Comments: B submandibular lymphadenopathy Cardiovascular:     Rate and Rhythm: Normal rate and regular rhythm.  Pulmonary:     Effort: Pulmonary effort is normal.     Breath sounds: Normal breath sounds.  Neurological:     Mental Status: She is alert.     UC Treatments / Results  Labs (all labs ordered are listed, but only abnormal results are displayed) Labs Reviewed  RESPIRATORY PANEL BY PCR  POCT RAPID STREP A, ED / UC    EKG   Radiology No results found.  Procedures Procedures (including critical care time)  Medications Ordered in UC Medications - No data to display  Initial Impression / Assessment and Plan / UC Course  I have reviewed the triage vital signs and the nursing notes.  Pertinent labs & imaging results that were available during my care of the patient were reviewed by me and considered in my medical decision making (see chart for details).  Rapid strep is negative.  Given symptoms, my suspicion for strep is low and I will  not order throat culture.  COVID and flu test pending.  Supportive care measures discussed.  Prescribed viscous lidocaine if throat pain becomes severe.  Given note for work.  Final Clinical Impressions(s) / UC Diagnoses   Final diagnoses:  Viral pharyngitis  Viral upper respiratory tract infection     Discharge Instructions      Use over the counter medicines such as Mucinex (generic guaifenesin is okay to use) plus saline nasal spray to try to help relieve your nasal congestion.  Try gargling with salt water to help relieve your sore throat.  Throat lozenges or cough drops can also help.  You will get a call.  Your COVID tests are positive.  You will not get a phone call if your tests are negative you can check your results in MyChart if you have a MyChart account.     ED Prescriptions     Medication Sig Dispense Auth. Provider   lidocaine (XYLOCAINE) 2 % solution Use as directed 15 mLs in the mouth or throat as needed for mouth pain. 100 mL Carvel Getting, NP      PDMP not reviewed this encounter.   Carvel Getting, NP 10/27/21 1426

## 2021-10-27 NOTE — Discharge Instructions (Addendum)
Use over the counter medicines such as Mucinex (generic guaifenesin is okay to use) plus saline nasal spray to try to help relieve your nasal congestion.  Try gargling with salt water to help relieve your sore throat.  Throat lozenges or cough drops can also help.  You will get a call.  Your COVID tests are positive.  You will not get a phone call if your tests are negative you can check your results in MyChart if you have a MyChart account.

## 2022-12-24 ENCOUNTER — Encounter (HOSPITAL_COMMUNITY): Payer: Self-pay

## 2022-12-24 ENCOUNTER — Ambulatory Visit (HOSPITAL_COMMUNITY)
Admission: EM | Admit: 2022-12-24 | Discharge: 2022-12-24 | Disposition: A | Discharge status: Home / Self Care | Payer: Medicaid Other | Attending: Emergency Medicine | Admitting: Emergency Medicine

## 2022-12-24 DIAGNOSIS — Z1152 Encounter for screening for COVID-19: Secondary | ICD-10-CM | POA: Insufficient documentation

## 2022-12-24 DIAGNOSIS — R059 Cough, unspecified: Secondary | ICD-10-CM | POA: Diagnosis present

## 2022-12-24 DIAGNOSIS — Z87891 Personal history of nicotine dependence: Secondary | ICD-10-CM | POA: Diagnosis not present

## 2022-12-24 DIAGNOSIS — J069 Acute upper respiratory infection, unspecified: Secondary | ICD-10-CM

## 2022-12-24 LAB — POC INFLUENZA A AND B ANTIGEN (URGENT CARE ONLY)
INFLUENZA A ANTIGEN, POC: NEGATIVE
INFLUENZA B ANTIGEN, POC: NEGATIVE

## 2022-12-24 LAB — SARS CORONAVIRUS 2 (TAT 6-24 HRS): SARS Coronavirus 2: NEGATIVE

## 2022-12-24 MED ORDER — IBUPROFEN 800 MG PO TABS
ORAL_TABLET | ORAL | Status: AC
  Filled 2022-12-24: qty 1

## 2022-12-24 MED ORDER — IBUPROFEN 800 MG PO TABS
800.0000 mg | ORAL_TABLET | Freq: Once | ORAL | Status: AC
  Administered 2022-12-24: 800 mg via ORAL

## 2022-12-24 MED ORDER — GUAIFENESIN ER 600 MG PO TB12: 600.0000 mg | ORAL_TABLET | Freq: Two times a day (BID) | ORAL | 0 refills | Status: AC

## 2022-12-24 MED ORDER — BENZONATATE 100 MG PO CAPS: 100.0000 mg | ORAL_CAPSULE | Freq: Three times a day (TID) | ORAL | 0 refills | Status: AC | PRN

## 2022-12-24 NOTE — Discharge Instructions (Addendum)
Flu test is negative.   We will call you if your covid test returns positive.  If positive we will send the antivirals to your pharmacy  I recommend mucinex twice daily with lots of fluids (GoodRx coupon attached) Tessalon (cough pills) can be used three times daily Continue ibuprofen or tylenol for aches

## 2022-12-24 NOTE — ED Provider Notes (Signed)
[place]    CSN: [id number] Arrival date & time: 12/24/22  1016     History   Chief Complaint Chief Complaint  Patient presents with   Cough    HPI Nichole Bowers is a 32 y.o. female.  Here with 3-4 day history of cough On day 1 she had some chills, body aches, fatigue No fever Cough was productive at first, became dry Reports chest discomfort with cough  Has tried theraflu, BC powder, ibuprofen. No meds taken today No known sick contacts  Past Medical History:  Diagnosis Date   Atrial fibrillation ([place])    Chlamydia 2019   Cocaine use    Positive drug screen on 11/03/15   Gestational diabetes    Marijuana use    Positive drug screen on 11/03/15   Sickle cell trait ([facility])    Urinary tract infection    Ventral hernia without obstruction or gangrene     Patient Active Problem List   Diagnosis Date Noted   Sickle cell trait ([place]) 12/28/2018   Ventral hernia without obstruction or gangrene 05/05/2016   Marijuana use 11/08/2015    Past Surgical History:  Procedure Laterality Date   CESAREAN SECTION N/A 06/25/2016   Procedure: CESAREAN SECTION;  Surgeon: Osborne Oman, MD;  Location: [facility];  Service: Obstetrics;  Laterality: N/A;   HERNIA REPAIR  2017   WISDOM TOOTH EXTRACTION      OB History     Gravida  2   Para  2   Term  2   Preterm  0   AB  0   Living  2      SAB  0   IAB  0   Ectopic  0   Multiple  0   Live Births  2            Home Medications    Prior to Admission medications   Medication Sig Start Date End Date Taking? Authorizing Provider  benzonatate (TESSALON) 100 MG capsule Take 1 capsule (100 mg total) by mouth 3 (three) times daily as needed for cough. 12/24/22  Yes Avary Eichenberger, Wells Guiles, PA-C  guaiFENesin (MUCINEX) 600 MG 12 hr tablet Take 1 tablet (600 mg total) by mouth 2 (two) times daily for 5 days. 12/24/22 12/29/22 Yes Ura Hausen, Wells Guiles, PA-C  Aspirin-Salicylamide-Caffeine (BC HEADACHE  POWDER PO) Take 1 packet by mouth daily as needed (headache).    [provider]  omeprazole (PRILOSEC) 20 MG capsule Take 1 capsule (20 mg total) by mouth daily for 14 days. 08/19/20 09/02/20  Darr, Edison Nasuti, PA-C    Family History Family History  Problem Relation Age of Onset   Healthy Mother    Healthy Father    Asthma Sister    Diabetes Maternal Grandfather    Diabetes Paternal Grandfather     Social History Social History   Tobacco Use   Smoking status: Former    Packs/day: 0.20    Types: Cigarettes    Quit date: 10/29/2018    Years since quitting: 4.1   Smokeless tobacco: Never   Tobacco comments:    1-2 cigarettes per day  Vaping Use   Vaping Use: Never used  Substance Use Topics   Alcohol use: Yes    Comment: weekends   Drug use: Not Currently    Types: Cocaine, Marijuana     Allergies   Phenergan [promethazine]   Review of Systems Review of Systems  Respiratory:  Positive for cough.    As  per HPI  Physical Exam Triage Vital Signs ED Triage Vitals [12/24/22 1245]  Enc Vitals Group     BP 134/86     Pulse Rate 68     Resp 16     Temp 98.7 F (37.1 C)     Temp Source Oral     SpO2 95 %     Weight      Height      Head Circumference      Peak Flow      Pain Score 0     Pain Loc      Pain Edu?      Excl. in [place]?    No data found.  Updated Vital Signs BP 134/86 (BP Location: Left Arm)   Pulse 68   Temp 98.7 F (37.1 C) (Oral)   Resp 16   SpO2 95%    Physical Exam Vitals and nursing note reviewed.  HENT:     Nose: No congestion or rhinorrhea.     Mouth/Throat:     Mouth: Mucous membranes are moist.     Pharynx: Oropharynx is clear. No posterior oropharyngeal erythema.  Eyes:     Conjunctiva/sclera: Conjunctivae normal.  Cardiovascular:     Rate and Rhythm: Normal rate and regular rhythm.     Pulses: Normal pulses.     Heart sounds: Normal heart sounds.  Pulmonary:     Effort: Pulmonary effort is normal.     Breath sounds:  Normal breath sounds.  Musculoskeletal:     Cervical back: Normal range of motion.  Lymphadenopathy:     Cervical: No cervical adenopathy.  Skin:    General: Skin is warm and dry.  Neurological:     Mental Status: She is alert and oriented to person, place, and time.     UC Treatments / Results  Labs (all labs ordered are listed, but only abnormal results are displayed) Labs Reviewed  SARS CORONAVIRUS 2 (TAT 6-24 HRS)  POC INFLUENZA A AND B ANTIGEN (URGENT CARE ONLY)    EKG  Radiology No results found.  Procedures Procedures (including critical care time)  Medications Ordered in UC Medications  ibuprofen (ADVIL) tablet 800 mg (800 mg Oral Given 12/24/22 1335)    Initial Impression / Assessment and Plan / UC Course  I have reviewed the triage vital signs and the nursing notes.  Pertinent labs & imaging results that were available during my care of the patient were reviewed by me and considered in my medical decision making (see chart for details).  Afebrile, clear lungs Ibu dose given for aches  Patient would like flu and covid testing today Discussed out of window for flu antivirals - patient understands and would still like test. Rapid flu A/B negative. Covid pending. If positive, candidate for Paxlovid. GFR >60  Discussed symptomatic care at home, recommend mucinex and tessalon, ibu for aches, lots of fluids Return precautions discussed. Patient agrees to plan  Final Clinical Impressions(s) / UC Diagnoses   Final diagnoses:  Viral URI with cough     Discharge Instructions      Flu test is negative.   We will call you if your covid test returns positive.  If positive we will send the antivirals to your pharmacy  I recommend mucinex twice daily with lots of fluids (GoodRx coupon attached) Tessalon (cough pills) can be used three times daily Continue ibuprofen or tylenol for aches     ED Prescriptions     Medication Sig Dispense Auth.  Provider    benzonatate (TESSALON) 100 MG capsule Take 1 capsule (100 mg total) by mouth 3 (three) times daily as needed for cough. 20 capsule Hanford Lust, PA-C   guaiFENesin (MUCINEX) 600 MG 12 hr tablet Take 1 tablet (600 mg total) by mouth 2 (two) times daily for 5 days. 10 tablet Renata Gambino, Wells Guiles, PA-C      PDMP not reviewed this encounter.   Kory Rains, Wells Guiles, [place] 12/24/22 1409

## 2022-12-24 NOTE — ED Triage Notes (Signed)
Patient reports a non productive cough x 3-4 days.  Patient states she has been taking TheraFlu with no relief.
